# Patient Record
Sex: Male | Born: 1970 | Race: White | Hispanic: No | Marital: Single | Smoking: Former smoker
Health system: Southern US, Community
[De-identification: ages and names within clinical notes are randomized; demographics above are authoritative.]

## PROBLEM LIST (undated history)

## (undated) DIAGNOSIS — F1010 Alcohol abuse, uncomplicated: Secondary | ICD-10-CM

## (undated) DIAGNOSIS — F10280 Alcohol dependence with alcohol-induced anxiety disorder: Secondary | ICD-10-CM

## (undated) DIAGNOSIS — K219 Gastro-esophageal reflux disease without esophagitis: Secondary | ICD-10-CM

## (undated) DIAGNOSIS — I10 Essential (primary) hypertension: Secondary | ICD-10-CM

## (undated) HISTORY — PX: ELBOW SURGERY: SHX618

---

## 2000-09-14 ENCOUNTER — Encounter: Admission: RE | Admit: 2000-09-14 | Discharge: 2000-09-14 | Payer: Self-pay | Admitting: Orthopedic Surgery

## 2000-09-14 ENCOUNTER — Encounter: Payer: Self-pay | Admitting: Orthopedic Surgery

## 2020-10-29 ENCOUNTER — Other Ambulatory Visit: Payer: Self-pay

## 2020-10-29 ENCOUNTER — Emergency Department
Admission: EM | Admit: 2020-10-29 | Discharge: 2020-10-29 | Disposition: A | Discharge status: Home / Self Care | Payer: Managed Care, Other (non HMO) | Attending: Family Medicine | Admitting: Family Medicine

## 2020-10-29 ENCOUNTER — Emergency Department: Payer: Managed Care, Other (non HMO)

## 2020-10-29 DIAGNOSIS — Z5321 Procedure and treatment not carried out due to patient leaving prior to being seen by health care provider: Secondary | ICD-10-CM | POA: Insufficient documentation

## 2020-10-29 DIAGNOSIS — M7989 Other specified soft tissue disorders: Secondary | ICD-10-CM | POA: Diagnosis not present

## 2020-10-29 DIAGNOSIS — M79671 Pain in right foot: Secondary | ICD-10-CM | POA: Insufficient documentation

## 2020-10-29 DIAGNOSIS — R251 Tremor, unspecified: Secondary | ICD-10-CM | POA: Insufficient documentation

## 2020-10-29 LAB — CBC WITH DIFFERENTIAL/PLATELET
Abs Immature Granulocytes: 0.01 10*3/uL (ref 0.00–0.07)
Basophils Absolute: 0.1 10*3/uL (ref 0.0–0.1)
Basophils Relative: 2 %
Eosinophils Absolute: 0.1 10*3/uL (ref 0.0–0.5)
Eosinophils Relative: 1 %
HCT: 42.6 % (ref 36.0–46.0)
Hemoglobin: 15.5 g/dL — ABNORMAL HIGH (ref 12.0–15.0)
Immature Granulocytes: 0 %
Lymphocytes Relative: 26 %
Lymphs Abs: 1.4 10*3/uL (ref 0.7–4.0)
MCH: 36.6 pg — ABNORMAL HIGH (ref 26.0–34.0)
MCHC: 36.4 g/dL — ABNORMAL HIGH (ref 30.0–36.0)
MCV: 100.7 fL — ABNORMAL HIGH (ref 80.0–100.0)
Monocytes Absolute: 0.8 10*3/uL (ref 0.1–1.0)
Monocytes Relative: 14 %
Neutro Abs: 3.1 10*3/uL (ref 1.7–7.7)
Neutrophils Relative %: 57 %
Platelets: 119 10*3/uL — ABNORMAL LOW (ref 150–400)
RBC: 4.23 MIL/uL (ref 3.87–5.11)
RDW: 14.3 % (ref 11.5–15.5)
Smear Review: NORMAL
WBC: 5.5 10*3/uL (ref 4.0–10.5)
nRBC: 0 % (ref 0.0–0.2)

## 2020-10-29 LAB — BASIC METABOLIC PANEL
Anion gap: 13 (ref 5–15)
BUN: 8 mg/dL (ref 6–20)
CO2: 20 mmol/L — ABNORMAL LOW (ref 22–32)
Calcium: 8.9 mg/dL (ref 8.9–10.3)
Chloride: 101 mmol/L (ref 98–111)
Creatinine, Ser: 0.6 mg/dL (ref 0.44–1.00)
GFR, Estimated: >60 mL/min (ref >60)
Glucose, Bld: 124 mg/dL — ABNORMAL HIGH (ref 70–99)
Potassium: 3.9 mmol/L (ref 3.5–5.1)
Sodium: 134 mmol/L — ABNORMAL LOW (ref 135–145)

## 2020-10-29 NOTE — ED Triage Notes (Signed)
Pt states that he woke up and noticed his right foot was discolored, states that he isn't having pain, the right foot and lower leg are more red than the left and swollen compared to the left, pt is a little shaky and states that he is nervous and that he does drink alcohol, his last drink was last pm and he wouldn't typically be drinking at this time of day  Pedal pulse palpated

## 2020-10-29 NOTE — ED Provider Notes (Signed)
Emergency Medicine Provider Triage Evaluation Note  Lawrence Rowe , a 50 y.o. male  was evaluated in triage.  Pt complains of right foot pain and discoloration. No pain. No injury. No previous symptoms of the same.  Review of Systems  Positive: Right foot swelling. Negative: Pain, fever  Physical Exam  There were no vitals taken for this visit. Gen:   Awake, no distress   Resp:  Normal effort  MSK:   Moves extremities without difficulty  Other:    Medical Decision Making  Medically screening exam initiated at 12:14 PM.  Appropriate orders placed.  [name] was informed that the remainder of the evaluation will be completed by another provider, this initial triage assessment does not replace that evaluation, and the importance of remaining in the ED until their evaluation is complete.   Chinita Pester, FNP 10/29/20 1224    Jene Every, MD 10/29/20 1249

## 2021-04-06 ENCOUNTER — Ambulatory Visit: Admission: EM | Admit: 2021-04-06 | Discharge: 2021-04-06 | Payer: Managed Care, Other (non HMO)

## 2021-04-06 ENCOUNTER — Encounter: Payer: Self-pay | Admitting: Emergency Medicine

## 2021-04-06 ENCOUNTER — Emergency Department
Admission: EM | Admit: 2021-04-06 | Discharge: 2021-04-06 | Disposition: A | Discharge status: Home / Self Care | Payer: Self-pay | Attending: Emergency Medicine | Admitting: Emergency Medicine

## 2021-04-06 ENCOUNTER — Emergency Department: Payer: Self-pay

## 2021-04-06 ENCOUNTER — Encounter: Payer: Self-pay | Admitting: Intensive Care

## 2021-04-06 ENCOUNTER — Other Ambulatory Visit: Payer: Self-pay

## 2021-04-06 DIAGNOSIS — F10129 Alcohol abuse with intoxication, unspecified: Secondary | ICD-10-CM | POA: Insufficient documentation

## 2021-04-06 DIAGNOSIS — F1010 Alcohol abuse, uncomplicated: Secondary | ICD-10-CM

## 2021-04-06 DIAGNOSIS — Y900 Blood alcohol level of less than 20 mg/100 ml: Secondary | ICD-10-CM | POA: Insufficient documentation

## 2021-04-06 DIAGNOSIS — R000 Tachycardia, unspecified: Secondary | ICD-10-CM | POA: Insufficient documentation

## 2021-04-06 DIAGNOSIS — M7989 Other specified soft tissue disorders: Secondary | ICD-10-CM

## 2021-04-06 DIAGNOSIS — M79604 Pain in right leg: Secondary | ICD-10-CM

## 2021-04-06 DIAGNOSIS — E871 Hypo-osmolality and hyponatremia: Secondary | ICD-10-CM | POA: Insufficient documentation

## 2021-04-06 DIAGNOSIS — L03115 Cellulitis of right lower limb: Secondary | ICD-10-CM | POA: Insufficient documentation

## 2021-04-06 HISTORY — DX: Alcohol abuse, uncomplicated: F10.10

## 2021-04-06 LAB — CBC
HCT: 45.5 % (ref 39.0–52.0)
Hemoglobin: 15.8 g/dL (ref 13.0–17.0)
MCH: 34.4 pg — ABNORMAL HIGH (ref 26.0–34.0)
MCHC: 34.7 g/dL (ref 30.0–36.0)
MCV: 99.1 fL (ref 80.0–100.0)
Platelets: 103 10*3/uL — ABNORMAL LOW (ref 150–400)
RBC: 4.59 MIL/uL (ref 4.22–5.81)
RDW: 14.1 % (ref 11.5–15.5)
WBC: 6.1 10*3/uL (ref 4.0–10.5)
nRBC: 0 % (ref 0.0–0.2)

## 2021-04-06 LAB — COMPREHENSIVE METABOLIC PANEL
ALT: 75 U/L — ABNORMAL HIGH (ref 0–44)
AST: 123 U/L — ABNORMAL HIGH (ref 15–41)
Albumin: 4.6 g/dL (ref 3.5–5.0)
Alkaline Phosphatase: 76 U/L (ref 38–126)
Anion gap: 12 (ref 5–15)
BUN: 16 mg/dL (ref 6–20)
CO2: 23 mmol/L (ref 22–32)
Calcium: 9.4 mg/dL (ref 8.9–10.3)
Chloride: 97 mmol/L — ABNORMAL LOW (ref 98–111)
Creatinine, Ser: 0.72 mg/dL (ref 0.61–1.24)
GFR, Estimated: >60 mL/min (ref >60)
Glucose, Bld: 153 mg/dL — ABNORMAL HIGH (ref 70–99)
Potassium: 4 mmol/L (ref 3.5–5.1)
Sodium: 132 mmol/L — ABNORMAL LOW (ref 135–145)
Total Bilirubin: 3.1 mg/dL — ABNORMAL HIGH (ref 0.3–1.2)
Total Protein: 10 g/dL — ABNORMAL HIGH (ref 6.5–8.1)

## 2021-04-06 LAB — ETHANOL: Alcohol, Ethyl (B): <10 mg/dL (ref <10)

## 2021-04-06 MED ORDER — CHLORDIAZEPOXIDE HCL 25 MG PO CAPS
25.0000 mg | ORAL_CAPSULE | Freq: Once | ORAL | Status: AC
  Administered 2021-04-06: 25 mg via ORAL
  Filled 2021-04-06: qty 1

## 2021-04-06 MED ORDER — CEPHALEXIN 500 MG PO CAPS: 500.0000 mg | ORAL_CAPSULE | Freq: Three times a day (TID) | ORAL | 0 refills | Status: AC

## 2021-04-06 MED ORDER — CHLORDIAZEPOXIDE HCL 25 MG PO CAPS: ORAL_CAPSULE | ORAL | 0 refills | Status: AC

## 2021-04-06 MED ORDER — CEPHALEXIN 500 MG PO CAPS
500.0000 mg | ORAL_CAPSULE | Freq: Once | ORAL | Status: AC
  Administered 2021-04-06: 500 mg via ORAL
  Filled 2021-04-06: qty 1

## 2021-04-06 MED ORDER — LORAZEPAM 2 MG/ML IJ SOLN
1.0000 mg | Freq: Once | INTRAMUSCULAR | Status: AC
  Administered 2021-04-06: 1 mg via INTRAVENOUS
  Filled 2021-04-06: qty 1

## 2021-04-06 NOTE — ED Notes (Signed)
Pt sent from [facility] for elevated BP. Pt went to UC for b/l foot swelling & discoloration, noticed this AM. Pt states he has never had this before. Pt denies pain. ?

## 2021-04-06 NOTE — Discharge Instructions (Signed)
You are being discharged with a 1 week course of Keflex antibiotic to take 3 times daily to treat the skin infection to her right lower leg.  Please finish all 21 pills. ? ?You are being discharged with a chlordiazepoxide/Librium taper over the next few days to help with alcohol withdrawals. ?On the first day, take 1-2 tablets every 6 hours as needed ?Day 2, 1 tablet every 6 hours as needed ?Day 3, 1 tablet every 12 hours as needed ?Day 4, 1 tablet every 12-24 hours as needed ? ?Do not drink alcohol while taking this medication. ?

## 2021-04-06 NOTE — ED Triage Notes (Signed)
Pt presents with bilateral foot/toe pain and is having difficulties walking due to his pain and anxiety. He reports having a full work up in the ED in October 2022. Pt left the ED without being seen and did not follow up. During check in patients BP is 205/114 and reports not having hypertension to his knowledge. The provider Wendee Beavers was notified and she spoke with patient regarding his BP and symptoms. He decided to go to the ED to be seen he was taken by his friend that accompanied him.  ?

## 2021-04-06 NOTE — ED Provider Notes (Signed)
? ?[facility] ?Provider Note ? ? ? Event Date/Time  ? First MD Initiated Contact with Patient 04/06/21 1458   ?  (approximate) ? ? ?History  ? ?Alcohol Intoxication and Foot Swelling ? ? ?HPI ? ?Lawrence Rowe is a 51 y.o. male who presents to the ED for evaluation of Alcohol Intoxication and Foot Swelling ?  ?No hx in the chart.  ? ?Patient presents to the ED with his ex-girlfriend for evaluation of alcoholism, right foot and ankle swelling .  He reports atraumatic swelling, erythema and pain to his right distal leg over the past couple days. ? ?Reports he typically drinks a 12 pack of beer per day, but has not drank in the past couple days due to his leg not feeling well and him trying to get healthier.  Denies any suicidal intent or other recreational drugs. ? ? ?Physical Exam  ? ?Triage Vital Signs: ?ED Triage Vitals [04/06/21 1340]  ?Enc Vitals Group  ?   BP (!) 169/122  ?   Pulse Rate (!) 121  ?   Resp 18  ?   Temp 98.8 ?F (37.1 ?C)  ?   Temp Source Oral  ?   SpO2 96 %  ?   Weight 200 lb (90.7 kg)  ?   Height 6' (1.829 m)  ?   Head Circumference   ?   Peak Flow   ?   Pain Score 0  ?   Pain Loc   ?   Pain Edu?   ?   Excl. in GC?   ? ? ?Most recent vital signs: ?Vitals:  ? 04/06/21 1730 04/06/21 1815  ?BP: (!) 147/101 (!) 156/97  ?Pulse: 91   ?Resp:    ?Temp:    ?SpO2: 100%   ? ? ?General: Awake, no distress.  Somewhat tremulous initially prior to benzodiazepines.  Otherwise pleasant and conversational in full sentences. ?CV:  Good peripheral perfusion.  Tachycardic and regular, resolving with Ativan and chlordiazepoxide. ?Resp:  Normal effort.  ?Abd:  No distention.  ?MSK:  Skin over the right ankle with erythema, induration and mild soft tissue swelling that is asymmetric to the left.  Passive range of motion of the right ankle without pain.  Right foot is distally neurovascularly intact. ?Neuro:  No focal deficits appreciated. ?Other:   ? ? ?ED Results / Procedures / Treatments   ? ?Labs ?(all labs ordered are listed, but only abnormal results are displayed) ?Labs Reviewed  ?COMPREHENSIVE METABOLIC PANEL - Abnormal; Notable for the following components:  ?    Result Value  ? Sodium 132 (*)   ? Chloride 97 (*)   ? Glucose, Bld 153 (*)   ? Total Protein 10.0 (*)   ? AST 123 (*)   ? ALT 75 (*)   ? Total Bilirubin 3.1 (*)   ? All other components within normal limits  ?CBC - Abnormal; Notable for the following components:  ? MCH 34.4 (*)   ? Platelets 103 (*)   ? All other components within normal limits  ?ETHANOL  ?URINE DRUG SCREEN, QUALITATIVE ([facility] ONLY)  ? ? ?EKG ?Sinus tachycardia with a rate of 116 bpm.  Normal axis and intervals.  Tremulous baseline.  No evidence of acute ischemia.  No comparison. ? ?RADIOLOGY ?Venous ultrasound without evidence of DVT ? ?Official radiology report(s): ?US Venous Img Lower Unilateral Right (DVT) ? ?Result Date: 04/06/2021 ?CLINICAL DATA:  Right lower extremity pain and swelling. EXAM: RIGHT LOWER  EXTREMITY VENOUS DOPPLER ULTRASOUND TECHNIQUE: Gray-scale sonography with compression, as well as color and duplex ultrasound, were performed to evaluate the deep venous system(s) from the level of the common femoral vein through the popliteal and proximal calf veins. COMPARISON:  None. FINDINGS: VENOUS Normal compressibility of the common femoral, superficial femoral, and popliteal veins, as well as the visualized calf veins. Visualized portions of profunda femoral vein and great saphenous vein unremarkable. No filling defects to suggest DVT on grayscale or color Doppler imaging. Doppler waveforms show normal direction of venous flow, normal respiratory plasticity and response to augmentation. Limited views of the contralateral common femoral vein are unremarkable. OTHER None. Limitations: none IMPRESSION: Negative. Electronically Signed   By: Larose Hires D.O.   On: 04/06/2021 17:25   ? ?PROCEDURES and INTERVENTIONS: ? ?.1-3 Lead EKG Interpretation ?Performed  by: Delton Prairie, MD ?Authorized by: Delton Prairie, MD  ? ?  Interpretation: abnormal   ?  ECG rate:  112 ?  ECG rate assessment: tachycardic   ?  Rhythm: sinus tachycardia   ?  Ectopy: none   ?  Conduction: normal   ? ?Medications  ?LORazepam (ATIVAN) injection 1 mg (1 mg Intravenous Given 04/06/21 1601)  ?chlordiazePOXIDE (LIBRIUM) capsule 25 mg (25 mg Oral Given 04/06/21 1600)  ?cephALEXin (KEFLEX) capsule 500 mg (500 mg Oral Given 04/06/21 1600)  ? ? ? ?IMPRESSION / MDM / ASSESSMENT AND PLAN / ED COURSE  ?I reviewed the triage vital signs and the nursing notes. ? ?51 year old male presents to the ED with evidence of alcohol withdrawals and right leg cellulitis, ultimately suitable for outpatient management.  He presents with evidence of mild withdrawals of ethanol, with tremulousness and tachycardia, resolved with benzodiazepines.  Start the patient on a chlordiazepoxide taper.  No evidence of DTs or seizures.  Blood work without evidence of alcoholic ketoacidosis.  Minimal hyponatremia and elevated LFTs.  CBC is normal.  DVT ultrasound without evidence of DVT.  Started the patient on Keflex to treat cellulitis of the right leg and discharged with Librium taper.  Provided referral to [facility] and discussed return precautions for the ED. ? ?  ? ? ?FINAL CLINICAL IMPRESSION(S) / ED DIAGNOSES  ? ?Final diagnoses:  ?ETOH abuse  ?Cellulitis of right lower extremity  ? ? ? ?Rx / DC Orders  ? ?ED Discharge Orders   ? ?      Ordered  ?  chlordiazePOXIDE (LIBRIUM) 25 MG capsule       ? 04/06/21 1622  ?  cephALEXin (KEFLEX) 500 MG capsule  3 times daily       ? 04/06/21 1622  ? ?  ?  ? ?  ? ? ? ?Note:  This document was prepared using Dragon voice recognition software and may include unintentional dictation errors. ?  ?Delton Prairie, MD ?04/06/21 2143 ? ?

## 2021-04-06 NOTE — ED Triage Notes (Signed)
Patient sent from [facility] for hypertension. Patient reports he went to be seen at [facility] for his bilateral foot swelling and growth. During triage patient is tachy, noticeable tremors and reports his last alcoholic drink was Sunday.  ?

## 2021-04-06 NOTE — ED Notes (Addendum)
Dr. Smith at bedside.

## 2021-04-14 ENCOUNTER — Ambulatory Visit
Admission: EM | Admit: 2021-04-14 | Discharge: 2021-04-14 | Disposition: A | Discharge status: Home / Self Care | Payer: No Typology Code available for payment source

## 2021-04-14 DIAGNOSIS — R000 Tachycardia, unspecified: Secondary | ICD-10-CM

## 2021-04-14 DIAGNOSIS — F1011 Alcohol abuse, in remission: Secondary | ICD-10-CM

## 2021-04-14 DIAGNOSIS — R030 Elevated blood-pressure reading, without diagnosis of hypertension: Secondary | ICD-10-CM

## 2021-04-14 NOTE — Discharge Instructions (Addendum)
Get compression stockings from the pharmacy and get number 12 compression, and wear it all the time.  ?Monitor your blood pressure and keep a record and needs to go down to less than 140/90 ?Continue to abstain from drinking. Avoid foods that are high in sodium ? ?Follow up with Dr Lavera Guise at 11:30 am on Tuesday 4/18 ? ?If you get chest pains, shortness of breath, you MUST GO TO ER ?

## 2021-04-14 NOTE — ED Triage Notes (Signed)
Patient presents to Urgent Care for follow-up from [facility] ED visit from 03/29. Pt was treated for cellulitis and alcohol withdrawal. Last drink per report 1.5 weeks ago. He states he finished the medications prescribed, concerned with continued swelling, mild redness, and warmth to bilateral feet. He is also concerned with elevated blood pressure.  ? ?Denies fever, SOB, chest pain, headache, or pain.  ?

## 2021-04-19 ENCOUNTER — Encounter: Payer: Self-pay | Admitting: Internal Medicine

## 2021-04-19 ENCOUNTER — Ambulatory Visit (INDEPENDENT_AMBULATORY_CARE_PROVIDER_SITE_OTHER): Payer: No Typology Code available for payment source | Admitting: Internal Medicine

## 2021-04-19 VITALS — BP 187/111 | HR 111 | Ht 72.0 in | Wt 210.5 lb

## 2021-04-19 DIAGNOSIS — I739 Peripheral vascular disease, unspecified: Secondary | ICD-10-CM

## 2021-04-19 DIAGNOSIS — I10 Essential (primary) hypertension: Secondary | ICD-10-CM

## 2021-04-19 DIAGNOSIS — F1010 Alcohol abuse, uncomplicated: Secondary | ICD-10-CM | POA: Diagnosis not present

## 2021-04-19 DIAGNOSIS — F1021 Alcohol dependence, in remission: Secondary | ICD-10-CM | POA: Insufficient documentation

## 2021-04-19 DIAGNOSIS — E8881 Metabolic syndrome: Secondary | ICD-10-CM | POA: Insufficient documentation

## 2021-04-19 DIAGNOSIS — F1091 Alcohol use, unspecified, in remission: Secondary | ICD-10-CM | POA: Insufficient documentation

## 2021-04-19 DIAGNOSIS — R000 Tachycardia, unspecified: Secondary | ICD-10-CM

## 2021-04-19 MED ORDER — METOPROLOL SUCCINATE ER 50 MG PO TB24: 50.0000 mg | ORAL_TABLET | Freq: Every day | ORAL | 3 refills | Status: DC

## 2021-04-19 NOTE — Assessment & Plan Note (Signed)
We will start the patient on beta-blocker ?

## 2021-04-19 NOTE — Assessment & Plan Note (Signed)
Patient was advised to lose weight 

## 2021-04-19 NOTE — Assessment & Plan Note (Signed)
Right foot is swollen and hand dermatitis on the right foot , pulses are not palpable to both feet due to swelling right foot is colder than the left.  Right leg is swollen.  Ultrasound done in the hospital does not show any blood clot.,  We will send the patient to the vascular specialist for evaluation ? patient to stop drinking ?

## 2021-04-19 NOTE — Progress Notes (Signed)
? ?New Patient Office Visit ? ?Subjective:  ?Patient ID: MILAS SCHAPPELL, male    DOB: 1970/05/22  Age: 51 y.o. MRN: [id number] ? ?CC:  ?Chief Complaint  ?Patient presents with  ? New Patient (Initial Visit)  ?  Patient here today to establish care. Patient had a recent ed visit with elevated BP.   ? ? ?Hypertension ?This is a new problem. The current episode started more than 1 year ago. The problem has been waxing and waning since onset. The problem is uncontrolled. Associated symptoms include anxiety, neck pain, palpitations and peripheral edema. Pertinent negatives include no blurred vision, chest pain, headaches, malaise/fatigue, orthopnea or shortness of breath. Hypertensive end-organ damage includes PVD. There is no history of CVA or heart failure.  ?Patient presents for new pt visit ?Past Medical History:  ?Diagnosis Date  ? Alcohol abuse   ? ? ? ?Current Outpatient Medications:  ?  ALPRAZolam (XANAX) 1 MG tablet, Take 1 mg by mouth at bedtime as needed for anxiety. Take 1 and half tablets daily prn, Disp: , Rfl:   ? ?Past Surgical History:  ?Procedure Laterality Date  ? ELBOW SURGERY Left   ? ? ?History reviewed. No pertinent family history. ? ?Social History  ? ?Socioeconomic History  ? Marital status: Single  ?  Spouse name: Not on file  ? Number of children: Not on file  ? Years of education: Not on file  ? Highest education level: Not on file  ?Occupational History  ? Not on file  ?Tobacco Use  ? Smoking status: Former  ?  Types: Cigarettes  ?  Quit date: 2020  ?  Years since quitting: 3.2  ? Smokeless tobacco: Never  ?Vaping Use  ? Vaping Use: Never used  ?Substance and Sexual Activity  ? Alcohol use: Yes  ?  Alcohol/week: 12.0 standard drinks  ?  Types: 12 Cans of beer per week  ?  Comment: occassional wine  ? Drug use: Never  ? Sexual activity: Not on file  ?Other Topics Concern  ? Not on file  ?Social History Narrative  ? Not on file  ? ?Social Determinants of Health  ? ?Financial Resource  Strain: Not on file  ?Food Insecurity: Not on file  ?Transportation Needs: Not on file  ?Physical Activity: Not on file  ?Stress: Not on file  ?Social Connections: Not on file  ?Intimate Partner Violence: Not on file  ? ? ?ROS ?Review of Systems  ?Constitutional:  Negative for malaise/fatigue.  ?Eyes:  Negative for blurred vision.  ?Respiratory:  Negative for shortness of breath.   ?Cardiovascular:  Positive for palpitations. Negative for chest pain and orthopnea.  ?Musculoskeletal:  Positive for neck pain.  ?Neurological:  Negative for headaches.  ? ?Objective:  ? ?Today's Vitals: BP (!) 187/111   Pulse (!) 111   Ht 6' (1.829 m)   Wt 210 lb 8 oz (95.5 kg)   BMI 28.55 kg/m?  ? ?Physical Exam ?Constitutional:   ?   Appearance: Normal appearance.  ?HENT:  ?   Head: Normocephalic.  ?   Mouth/Throat:  ?   Mouth: Mucous membranes are moist.  ?Cardiovascular:  ?   Rate and Rhythm: Tachycardia present.  ?   Heart sounds:  ?  No gallop.  ?Pulmonary:  ?   Breath sounds: No wheezing or rhonchi.  ?Abdominal:  ?   Palpations: Abdomen is soft.  ?   Tenderness: There is no abdominal tenderness.  ?Skin: ?   General: Skin is  warm.  ?Neurological:  ?   General: No focal deficit present.  ?   Mental Status: He is alert.  ? ? ?Assessment & Plan:  ? ?Problem List Items Addressed This Visit   ? ?  ? Cardiovascular and Mediastinum  ? PAD (peripheral artery disease) (HCC)  ?  Right foot is swollen and hand dermatitis on the right foot , pulses are not palpable to both feet due to swelling right foot is colder than the left.  Right leg is swollen.  Ultrasound done in the hospital does not show any blood clot.,  We will send the patient to the vascular specialist for evaluation ? patient to stop drinking ?  ?  ?  ? Other  ? Tachycardia with hypertension - Primary  ?  We will start the patient on beta-blocker ?  ?  ? Relevant Medications  ? ALPRAZolam (XANAX) 1 MG tablet  ? Alcohol abuse  ?  Patient has stopped drinking ?  ?  ? Metabolic  syndrome  ?  Patient was advised to lose weight. ?  ?  ? ? ?Outpatient Encounter Medications as of 04/19/2021  ?Medication Sig  ? ALPRAZolam (XANAX) 1 MG tablet Take 1 mg by mouth at bedtime as needed for anxiety. Take 1 and half tablets daily prn  ? ?No facility-administered encounter medications on file as of 04/19/2021.  ? ? ?Follow-up: No follow-ups on file.  ? ?Corky Downs, MD ?

## 2021-04-19 NOTE — Addendum Note (Signed)
Addended by: Jobie Quaker on: 04/19/2021 12:34 PM ? ? Modules accepted: Orders ? ?

## 2021-04-19 NOTE — Assessment & Plan Note (Signed)
Patient has stopped drinking 

## 2021-05-06 ENCOUNTER — Ambulatory Visit (INDEPENDENT_AMBULATORY_CARE_PROVIDER_SITE_OTHER): Payer: No Typology Code available for payment source

## 2021-05-06 DIAGNOSIS — I10 Essential (primary) hypertension: Secondary | ICD-10-CM | POA: Diagnosis not present

## 2021-05-06 DIAGNOSIS — R000 Tachycardia, unspecified: Secondary | ICD-10-CM

## 2021-05-06 DIAGNOSIS — Z7689 Persons encountering health services in other specified circumstances: Secondary | ICD-10-CM

## 2021-05-09 LAB — COMPLETE METABOLIC PANEL WITH GFR
AG Ratio: 1.1 (calc) (ref 1.0–2.5)
ALT: 43 U/L (ref 9–46)
AST: 51 U/L — ABNORMAL HIGH (ref 10–35)
Albumin: 4 g/dL (ref 3.6–5.1)
Alkaline phosphatase (APISO): 64 U/L (ref 35–144)
BUN: 11 mg/dL (ref 7–25)
CO2: 20 mmol/L (ref 20–32)
Calcium: 9.5 mg/dL (ref 8.6–10.3)
Chloride: 103 mmol/L (ref 98–110)
Creat: 0.73 mg/dL (ref 0.70–1.30)
Globulin: 3.7 g/dL (calc) (ref 1.9–3.7)
Glucose, Bld: 109 mg/dL — ABNORMAL HIGH (ref 65–99)
Potassium: 4.6 mmol/L (ref 3.5–5.3)
Sodium: 139 mmol/L (ref 135–146)
Total Bilirubin: 1.3 mg/dL — ABNORMAL HIGH (ref 0.2–1.2)
Total Protein: 7.7 g/dL (ref 6.1–8.1)
eGFR: 109 mL/min/{1.73_m2} (ref >=60)

## 2021-05-09 LAB — HIV ANTIBODY (ROUTINE TESTING W REFLEX): HIV 1&2 Ab, 4th Generation: NONREACTIVE

## 2021-05-09 LAB — CBC WITH DIFFERENTIAL/PLATELET
Absolute Monocytes: 1101 cells/uL — ABNORMAL HIGH (ref 200–950)
Basophils Absolute: 52 cells/uL (ref 0–200)
Basophils Relative: 0.6 %
Eosinophils Absolute: 129 cells/uL (ref 15–500)
Eosinophils Relative: 1.5 %
HCT: 46.3 % (ref 38.5–50.0)
Hemoglobin: 15.7 g/dL (ref 13.2–17.1)
Lymphs Abs: 2193 cells/uL (ref 850–3900)
MCH: 33.7 pg — ABNORMAL HIGH (ref 27.0–33.0)
MCHC: 33.9 g/dL (ref 32.0–36.0)
MCV: 99.4 fL (ref 80.0–100.0)
MPV: 9.4 fL (ref 7.5–12.5)
Monocytes Relative: 12.8 %
Neutro Abs: 5126 cells/uL (ref 1500–7800)
Neutrophils Relative %: 59.6 %
Platelets: 225 10*3/uL (ref 140–400)
RBC: 4.66 10*6/uL (ref 4.20–5.80)
RDW: 12.6 % (ref 11.0–15.0)
Total Lymphocyte: 25.5 %
WBC: 8.6 10*3/uL (ref 3.8–10.8)

## 2021-05-09 LAB — HEPATITIS C ANTIBODY
Hepatitis C Ab: NONREACTIVE
SIGNAL TO CUT-OFF: 0.1 (ref <1.00)

## 2021-05-09 LAB — TSH: TSH: 2.83 mIU/L (ref 0.40–4.50)

## 2021-05-09 LAB — LIPID PANEL
Cholesterol: 175 mg/dL (ref <200)
HDL: 41 mg/dL (ref >=40)
LDL Cholesterol (Calc): 115 mg/dL (calc) — ABNORMAL HIGH
Non-HDL Cholesterol (Calc): 134 mg/dL (calc) — ABNORMAL HIGH (ref <130)
Total CHOL/HDL Ratio: 4.3 (calc) (ref <5.0)
Triglycerides: 93 mg/dL (ref <150)

## 2021-05-09 LAB — PSA: PSA: 0.67 ng/mL (ref <=4.00)

## 2021-05-10 ENCOUNTER — Encounter: Payer: Self-pay | Admitting: Internal Medicine

## 2021-05-10 ENCOUNTER — Ambulatory Visit (INDEPENDENT_AMBULATORY_CARE_PROVIDER_SITE_OTHER): Payer: No Typology Code available for payment source | Admitting: Internal Medicine

## 2021-05-10 VITALS — BP 140/80 | HR 84 | Ht 72.0 in | Wt 208.2 lb

## 2021-05-10 DIAGNOSIS — F1010 Alcohol abuse, uncomplicated: Secondary | ICD-10-CM

## 2021-05-10 DIAGNOSIS — I10 Essential (primary) hypertension: Secondary | ICD-10-CM

## 2021-05-10 DIAGNOSIS — L309 Dermatitis, unspecified: Secondary | ICD-10-CM

## 2021-05-10 DIAGNOSIS — E8881 Metabolic syndrome: Secondary | ICD-10-CM

## 2021-05-10 DIAGNOSIS — R000 Tachycardia, unspecified: Secondary | ICD-10-CM

## 2021-05-10 NOTE — Assessment & Plan Note (Signed)

## 2021-05-10 NOTE — Assessment & Plan Note (Signed)

## 2021-05-10 NOTE — Assessment & Plan Note (Signed)
Patient says that he has stopped drinking ?

## 2021-05-10 NOTE — Assessment & Plan Note (Signed)
Patient will be referred to the dermatologist ?

## 2021-05-10 NOTE — Assessment & Plan Note (Signed)
Under control on beta-blocker ?

## 2021-05-10 NOTE — Progress Notes (Signed)
? ?Established Patient Office Visit ? ?Subjective:  ?Patient ID: Lawrence Rowe, male    DOB: [birth year]  Age: 51 y.o. MRN: [id number] ? ?CC:  ?Chief Complaint  ?Patient presents with  ? Hypertension  ? ? ?Hypertension ? ? ?[name] presents for check up ? ?Past Medical History:  ?Diagnosis Date  ? Alcohol abuse   ? ? ?Past Surgical History:  ?Procedure Laterality Date  ? ELBOW SURGERY Left   ? ? ?History reviewed. No pertinent family history. ? ?Social History  ? ?Socioeconomic History  ? Marital status: Single  ?  Spouse name: Not on file  ? Number of children: Not on file  ? Years of education: Not on file  ? Highest education level: Not on file  ?Occupational History  ? Not on file  ?Tobacco Use  ? Smoking status: Former  ?  Types: Cigarettes  ?  Quit date: 2020  ?  Years since quitting: 3.3  ? Smokeless tobacco: Never  ?Vaping Use  ? Vaping Use: Never used  ?Substance and Sexual Activity  ? Alcohol use: Yes  ?  Alcohol/week: 12.0 standard drinks  ?  Types: 12 Cans of beer per week  ?  Comment: occassional wine  ? Drug use: Never  ? Sexual activity: Not on file  ?Other Topics Concern  ? Not on file  ?Social History Narrative  ? Not on file  ? ?Social Determinants of Health  ? ?Financial Resource Strain: Not on file  ?Food Insecurity: Not on file  ?Transportation Needs: Not on file  ?Physical Activity: Not on file  ?Stress: Not on file  ?Social Connections: Not on file  ?Intimate Partner Violence: Not on file  ? ? ? ?Current Outpatient Medications:  ?  ALPRAZolam (XANAX) 1 MG tablet, Take 1 mg by mouth at bedtime as needed for anxiety. Take 1 and half tablets daily prn, Disp: , Rfl:  ?  metoprolol succinate (TOPROL-XL) 50 MG 24 hr tablet, Take 1 tablet (50 mg total) by mouth daily. Take with or immediately following a meal., Disp: 90 tablet, Rfl: 3  ? ?Allergies  ?Allergen Reactions  ? Penicillins Other (See Comments)  ? ? ?ROS ?Review of Systems  ?Constitutional: Negative.   ?HENT: Negative.     ?Eyes: Negative.   ?Respiratory: Negative.    ?Cardiovascular: Negative.   ?Gastrointestinal: Negative.   ?Endocrine: Negative.   ?Genitourinary: Negative.   ?Musculoskeletal: Negative.   ?Skin: Negative.   ?Allergic/Immunologic: Negative.   ?Neurological: Negative.   ?Hematological: Negative.   ?Psychiatric/Behavioral: Negative.    ?All other systems reviewed and are negative. ? ?  ?Objective:  ?  ?Physical Exam ?Vitals reviewed.  ?Constitutional:   ?   Appearance: Normal appearance.  ?HENT:  ?   Mouth/Throat:  ?   Mouth: Mucous membranes are moist.  ?Eyes:  ?   Pupils: Pupils are equal, round, and reactive to light.  ?Neck:  ?   Vascular: No carotid bruit.  ?Cardiovascular:  ?   Rate and Rhythm: Normal rate and regular rhythm.  ?   Pulses: Normal pulses.  ?   Heart sounds: Normal heart sounds.  ?Pulmonary:  ?   Effort: Pulmonary effort is normal.  ?   Breath sounds: Normal breath sounds.  ?Abdominal:  ?   General: Bowel sounds are normal.  ?   Palpations: Abdomen is soft. There is no hepatomegaly, splenomegaly or mass.  ?   Tenderness: There is no abdominal tenderness.  ?   Hernia:  No hernia is present.  ?Musculoskeletal:  ?   Cervical back: Neck supple.  ?   Right lower leg: No edema.  ?   Left lower leg: No edema.  ?Skin: ?   Findings: No rash.  ?   Comments: Dermatitis  of both legs  ?Neurological:  ?   Mental Status: He is alert and oriented to person, place, and time.  ?   Motor: No weakness.  ?Psychiatric:     ?   Mood and Affect: Mood normal.     ?   Behavior: Behavior normal.  ? ? ?BP 140/80   Pulse 84   Ht 6' (1.829 m)   Wt 208 lb 3.2 oz (94.4 kg)   BMI 28.24 kg/m?  ?Wt Readings from Last 3 Encounters:  ?05/10/21 208 lb 3.2 oz (94.4 kg)  ?04/19/21 210 lb 8 oz (95.5 kg)  ?04/06/21 200 lb (90.7 kg)  ? ? ? ?Health Maintenance Due  ?Topic Date Due  ? COVID-19 Vaccine (1) Never done  ? TETANUS/TDAP  Never done  ? COLONOSCOPY (Pts 45-75yr Insurance coverage will need to be confirmed)  Never done  ? Zoster  Vaccines- Shingrix (1 of 2) Never done  ? ? ?There are no preventive care reminders to display for this patient. ? ?Lab Results  ?Component Value Date  ? TSH 2.83 05/06/2021  ? ?Lab Results  ?Component Value Date  ? WBC 8.6 05/06/2021  ? HGB 15.7 05/06/2021  ? HCT 46.3 05/06/2021  ? MCV 99.4 05/06/2021  ? PLT 225 05/06/2021  ? ?Lab Results  ?Component Value Date  ? NA 139 05/06/2021  ? K 4.6 05/06/2021  ? CO2 20 05/06/2021  ? GLUCOSE 109 (H) 05/06/2021  ? BUN 11 05/06/2021  ? CREATININE 0.73 05/06/2021  ? BILITOT 1.3 (H) 05/06/2021  ? ALKPHOS 76 04/06/2021  ? AST 51 (H) 05/06/2021  ? ALT 43 05/06/2021  ? PROT 7.7 05/06/2021  ? ALBUMIN 4.6 04/06/2021  ? CALCIUM 9.5 05/06/2021  ? ANIONGAP 12 04/06/2021  ? EGFR 109 05/06/2021  ? ?Lab Results  ?Component Value Date  ? CHOL 175 05/06/2021  ? ?Lab Results  ?Component Value Date  ? HDL 41 05/06/2021  ? ?Lab Results  ?Component Value Date  ? LDLCALC 115 (H) 05/06/2021  ? ?Lab Results  ?Component Value Date  ? TRIG 93 05/06/2021  ? ?Lab Results  ?Component Value Date  ? CHOLHDL 4.3 05/06/2021  ? ?No results found for: HGBA1C ? ?  ?Assessment & Plan:  ? ?Problem List Items Addressed This Visit   ? ?  ? Cardiovascular and Mediastinum  ? Primary hypertension  ?   Patient denies any chest pain or shortness of breath there is no history of palpitation or paroxysmal nocturnal dyspnea ?  patient was advised to follow low-salt low-cholesterol diet ? ?  ideally I want to keep systolic blood pressure below 130 mmHg, patient was asked to check blood pressure one times a week and give me a report on that.  Patient will be follow-up in 3 months  or earlier as needed, patient will call me back for any change in the cardiovascular symptoms ?Patient was advised to buy a book from local bookstore concerning blood pressure and read several chapters  every day.  This will be supplemented by some of the material we will give him from the office.  Patient should also utilize other resources like  YouTube and Internet to learn more about the blood pressure and the  diet. ? ?  ?  ?  ? Musculoskeletal and Integument  ? Dermatitis  ?  Patient will be referred to the dermatologist ? ?  ?  ?  ? Other  ? Tachycardia with hypertension  ?  Under control on beta-blocker ? ?  ?  ? Alcohol abuse - Primary  ?  Patient says that he has stopped drinking ? ?  ?  ? Metabolic syndrome  ?  - I encouraged the patient to lose weight.  ?- I educated them on making healthy dietary choices including eating more fruits and vegetables and less fried foods. ?- I encouraged the patient to exercise more, and educated on the benefits of exercise including weight loss, diabetes prevention, and hypertension prevention.  ? Dietary counseling with a registered dietician ? Referral to a weight management support group (e.g. Weight Watchers, Overeaters Anonymous) ?? If your BMI is greater than 29 or you have gained more than 15 pounds you should work on weight loss. ?? Attend a healthy cooking class  ? ?  ?  ? ? ?No orders of the defined types were placed in this encounter. ? ? ?Follow-up: No follow-ups on file.  ? ? ?Cletis Athens, MD ?

## 2021-05-11 NOTE — Addendum Note (Signed)
Addended by: Melody Comas L on: 05/11/2021 11:11 AM ? ? Modules accepted: Orders ? ?

## 2021-05-24 ENCOUNTER — Other Ambulatory Visit (INDEPENDENT_AMBULATORY_CARE_PROVIDER_SITE_OTHER): Payer: Self-pay | Admitting: Nurse Practitioner

## 2021-05-24 DIAGNOSIS — I739 Peripheral vascular disease, unspecified: Secondary | ICD-10-CM

## 2021-05-27 ENCOUNTER — Encounter (INDEPENDENT_AMBULATORY_CARE_PROVIDER_SITE_OTHER): Payer: Self-pay

## 2021-05-27 ENCOUNTER — Encounter (INDEPENDENT_AMBULATORY_CARE_PROVIDER_SITE_OTHER): Payer: Self-pay | Admitting: Nurse Practitioner

## 2021-08-10 ENCOUNTER — Ambulatory Visit: Payer: No Typology Code available for payment source | Admitting: Internal Medicine

## 2021-09-25 ENCOUNTER — Emergency Department
Admission: EM | Admit: 2021-09-25 | Discharge: 2021-09-25 | Disposition: A | Discharge status: Home / Self Care | Payer: No Typology Code available for payment source | Attending: Emergency Medicine | Admitting: Emergency Medicine

## 2021-09-25 ENCOUNTER — Emergency Department: Payer: No Typology Code available for payment source

## 2021-09-25 DIAGNOSIS — Z20822 Contact with and (suspected) exposure to covid-19: Secondary | ICD-10-CM | POA: Insufficient documentation

## 2021-09-25 DIAGNOSIS — I10 Essential (primary) hypertension: Secondary | ICD-10-CM | POA: Diagnosis not present

## 2021-09-25 DIAGNOSIS — M79671 Pain in right foot: Secondary | ICD-10-CM | POA: Insufficient documentation

## 2021-09-25 DIAGNOSIS — M7989 Other specified soft tissue disorders: Secondary | ICD-10-CM

## 2021-09-25 DIAGNOSIS — R2241 Localized swelling, mass and lump, right lower limb: Secondary | ICD-10-CM | POA: Diagnosis not present

## 2021-09-25 LAB — CBC WITH DIFFERENTIAL/PLATELET
Abs Immature Granulocytes: 0.03 10*3/uL (ref 0.00–0.07)
Basophils Absolute: 0 10*3/uL (ref 0.0–0.1)
Basophils Relative: 1 %
Eosinophils Absolute: 0.1 10*3/uL (ref 0.0–0.5)
Eosinophils Relative: 2 %
HCT: 42.7 % (ref 39.0–52.0)
Hemoglobin: 14.4 g/dL (ref 13.0–17.0)
Immature Granulocytes: 1 %
Lymphocytes Relative: 10 %
Lymphs Abs: 0.6 10*3/uL — ABNORMAL LOW (ref 0.7–4.0)
MCH: 34 pg (ref 26.0–34.0)
MCHC: 33.7 g/dL (ref 30.0–36.0)
MCV: 100.9 fL — ABNORMAL HIGH (ref 80.0–100.0)
Monocytes Absolute: 0.9 10*3/uL (ref 0.1–1.0)
Monocytes Relative: 15 %
Neutro Abs: 4.4 10*3/uL (ref 1.7–7.7)
Neutrophils Relative %: 71 %
Platelets: 89 10*3/uL — ABNORMAL LOW (ref 150–400)
RBC: 4.23 MIL/uL (ref 4.22–5.81)
RDW: 14.6 % (ref 11.5–15.5)
WBC: 6.1 10*3/uL (ref 4.0–10.5)
nRBC: 0 % (ref 0.0–0.2)

## 2021-09-25 LAB — SARS CORONAVIRUS 2 BY RT PCR: SARS Coronavirus 2 by RT PCR: NEGATIVE

## 2021-09-25 LAB — COMPREHENSIVE METABOLIC PANEL
ALT: 34 U/L (ref 0–44)
AST: 62 U/L — ABNORMAL HIGH (ref 15–41)
Albumin: 4.4 g/dL (ref 3.5–5.0)
Alkaline Phosphatase: 70 U/L (ref 38–126)
Anion gap: 14 (ref 5–15)
BUN: 12 mg/dL (ref 6–20)
CO2: 21 mmol/L — ABNORMAL LOW (ref 22–32)
Calcium: 9.7 mg/dL (ref 8.9–10.3)
Chloride: 105 mmol/L (ref 98–111)
Creatinine, Ser: 0.68 mg/dL (ref 0.61–1.24)
GFR, Estimated: >60 mL/min (ref >60)
Glucose, Bld: 156 mg/dL — ABNORMAL HIGH (ref 70–99)
Potassium: 3.9 mmol/L (ref 3.5–5.1)
Sodium: 140 mmol/L (ref 135–145)
Total Bilirubin: 2.6 mg/dL — ABNORMAL HIGH (ref 0.3–1.2)
Total Protein: 8.9 g/dL — ABNORMAL HIGH (ref 6.5–8.1)

## 2021-09-25 LAB — T4, FREE: Free T4: 0.98 ng/dL (ref 0.61–1.12)

## 2021-09-25 LAB — TSH: TSH: 2.825 u[IU]/mL (ref 0.350–4.500)

## 2021-09-25 LAB — LACTIC ACID, PLASMA: Lactic Acid, Venous: 1.5 mmol/L (ref 0.5–1.9)

## 2021-09-25 MED ORDER — CHLORDIAZEPOXIDE HCL 25 MG PO CAPS
25.0000 mg | ORAL_CAPSULE | Freq: Once | ORAL | Status: AC
  Administered 2021-09-25: 25 mg via ORAL
  Filled 2021-09-25: qty 1

## 2021-09-25 MED ORDER — SODIUM CHLORIDE 0.9 % IV BOLUS
1000.0000 mL | Freq: Once | INTRAVENOUS | Status: AC
  Administered 2021-09-25: 1000 mL via INTRAVENOUS

## 2021-09-25 MED ORDER — METOPROLOL SUCCINATE ER 50 MG PO TB24: 50.0000 mg | ORAL_TABLET | Freq: Every day | ORAL | 3 refills | Status: DC

## 2021-09-25 MED ORDER — METOPROLOL SUCCINATE ER 50 MG PO TB24
50.0000 mg | ORAL_TABLET | ORAL | Status: AC
  Administered 2021-09-25: 50 mg via ORAL
  Filled 2021-09-25: qty 1

## 2021-09-25 MED ORDER — LEVETIRACETAM 500 MG PO TABS: 500.0000 mg | ORAL_TABLET | Freq: Once | ORAL | Status: DC

## 2021-09-25 MED ORDER — CEPHALEXIN 500 MG PO CAPS
500.0000 mg | ORAL_CAPSULE | Freq: Once | ORAL | Status: AC
  Administered 2021-09-25: 500 mg via ORAL
  Filled 2021-09-25: qty 1

## 2021-09-25 MED ORDER — CEPHALEXIN 500 MG PO CAPS: 500.0000 mg | ORAL_CAPSULE | Freq: Three times a day (TID) | ORAL | 0 refills | Status: DC

## 2021-09-25 MED ORDER — SULFAMETHOXAZOLE-TRIMETHOPRIM 800-160 MG PO TABS: 1.0000 | ORAL_TABLET | Freq: Two times a day (BID) | ORAL | 0 refills | Status: DC

## 2021-09-25 MED ORDER — SULFAMETHOXAZOLE-TRIMETHOPRIM 800-160 MG PO TABS
1.0000 | ORAL_TABLET | Freq: Once | ORAL | Status: AC
  Administered 2021-09-25: 1 via ORAL
  Filled 2021-09-25: qty 1

## 2021-09-25 NOTE — Discharge Instructions (Signed)
Your lab tests and ultrasound today are all okay.  Your symptoms may be due to developing skin infection in the right leg, and we have sent a prescription for antibiotics to your pharmacy to control this.  Take these medications as prescribed for the next week, and follow-up with Dr. Lavera Guise this week for further evaluation.

## 2021-09-25 NOTE — ED Provider Notes (Signed)
[facility] Provider Note    Event Date/Time   First MD Initiated Contact with Patient 09/25/21 1114     (approximate)   History   Chief Complaint: Foot Pain and Hypertension (C/o high blood pressure non compliant with BP meds, right foot discoloration X3 days, turning purple and hot to the touch. ETOH last drink was last night.)   HPI  Lawrence Rowe is a 51 y.o. male with a history of hypertension who comes the ED complaining of discoloration and swelling of the right leg below the knee for the past 3 days, gradual onset and constant.  Denies any recent falls or trauma.  Denies any pain.  No motor weakness or loss of sensation.  Denies history of DVT.  No chest pain shortness of breath or palpitations.  Reviewed outside medical records, noting that patient was previously seen in primary care where hypertension and tachycardia are described as chronic issues managed by Toprol-XL.     Physical Exam   Triage Vital Signs: ED Triage Vitals  Enc Vitals Group     BP 09/25/21 1116 (!) 187/111     Pulse Rate 09/25/21 1116 (!) 123     Resp 09/25/21 1116 18     Temp 09/25/21 1116 98.5 F (36.9 C)     Temp Source 09/25/21 1116 Oral     SpO2 09/25/21 1116 97 %     Weight --      Height --      Head Circumference --      Peak Flow --      Pain Score 09/25/21 1120 0     Pain Loc --      Pain Edu? --      Excl. in [place]? --     Most recent vital signs: Vitals:   09/25/21 1116  BP: (!) 187/111  Pulse: (!) 123  Resp: 18  Temp: 98.5 F (36.9 C)  SpO2: 97%    General: Awake, no distress.  CV:  Good peripheral perfusion.  Tachycardia heart rate 115.  Symmetric distal pulses. Resp:  Normal effort.  Clear to auscultation bilaterally Abd:  No distention.  Soft nontender Other:  Right leg demonstrates swelling and erythema diffusely below the knee.  Knee is nontender without effusion and has intact range of motion.   ED Results / Procedures /  Treatments   Labs (all labs ordered are listed, but only abnormal results are displayed) Labs Reviewed  COMPREHENSIVE METABOLIC PANEL - Abnormal; Notable for the following components:      Result Value   CO2 21 (*)    Glucose, Bld 156 (*)    Total Protein 8.9 (*)    AST 62 (*)    Total Bilirubin 2.6 (*)    All other components within normal limits  CBC WITH DIFFERENTIAL/PLATELET - Abnormal; Notable for the following components:   MCV 100.9 (*)    Platelets 89 (*)    Lymphs Abs 0.6 (*)    All other components within normal limits  SARS CORONAVIRUS 2 BY RT PCR  LACTIC ACID, PLASMA  TSH  T4, FREE     EKG Interpreted by me Sinus tachycardia rate 113.  Normal axis and intervals.  Normal QRS ST segments and T waves.   RADIOLOGY Ultrasound right lower extremity interpreted by me, negative for DVT.  Radiology report reviewed.   PROCEDURES:  Procedures   MEDICATIONS ORDERED IN ED: Medications  sulfamethoxazole-trimethoprim (BACTRIM DS) 800-160 MG per tablet 1 tablet (  has no administration in time range)  cephALEXin (KEFLEX) capsule 500 mg (has no administration in time range)  sodium chloride 0.9 % bolus 1,000 mL (0 mLs Intravenous Stopped 09/25/21 1320)  metoprolol succinate (TOPROL-XL) 24 hr tablet 50 mg (50 mg Oral Given 09/25/21 1158)  chlordiazePOXIDE (LIBRIUM) capsule 25 mg (25 mg Oral Given 09/25/21 1246)     IMPRESSION / MDM / ASSESSMENT AND PLAN / ED COURSE  I reviewed the triage vital signs and the nursing notes.                              Differential diagnosis includes, but is not limited to, DVT, cellulitis, dermatitis, chronic lymphedema, COVID rash  Patient's presentation is most consistent with acute presentation with potential threat to life or bodily function.  Patient presents with swelling and red/purplish discoloration of the right lower leg for 3 days.  Will obtain labs and ultrasound.  I think his tachycardia is chronic and due to medication  noncompliance as he does report running out of his metoprolol several months ago.  ----------------------------------------- 1:53 PM on 09/25/2021 ----------------------------------------- Work-up was all okay.  He is not requiring admission, not septic.  No evidence of DVT.  Started on Keflex and Bactrim and refill his metoprolol for primary care follow-up.       FINAL CLINICAL IMPRESSION(S) / ED DIAGNOSES   Final diagnoses:  Right leg swelling     Rx / DC Orders   ED Discharge Orders          Ordered    cephALEXin (KEFLEX) 500 MG capsule  3 times daily        09/25/21 1347    sulfamethoxazole-trimethoprim (BACTRIM DS) 800-160 MG tablet  2 times daily        09/25/21 1347    metoprolol succinate (TOPROL-XL) 50 MG 24 hr tablet  Daily        09/25/21 1353             Note:  This document was prepared using Dragon voice recognition software and may include unintentional dictation errors.   Sharman Cheek, MD 09/25/21 1354

## 2021-10-06 ENCOUNTER — Ambulatory Visit (INDEPENDENT_AMBULATORY_CARE_PROVIDER_SITE_OTHER): Payer: No Typology Code available for payment source | Admitting: Dermatology

## 2021-10-06 DIAGNOSIS — L03119 Cellulitis of unspecified part of limb: Secondary | ICD-10-CM

## 2021-10-06 DIAGNOSIS — I8311 Varicose veins of right lower extremity with inflammation: Secondary | ICD-10-CM

## 2021-10-06 MED ORDER — MUPIROCIN 2 % EX OINT: TOPICAL_OINTMENT | CUTANEOUS | 0 refills | Status: DC

## 2021-10-06 MED ORDER — SULFAMETHOXAZOLE-TRIMETHOPRIM 800-160 MG PO TABS: ORAL_TABLET | ORAL | 0 refills | Status: DC

## 2021-10-06 MED ORDER — CEPHALEXIN 500 MG PO CAPS: 500.0000 mg | ORAL_CAPSULE | Freq: Three times a day (TID) | ORAL | 0 refills | Status: DC

## 2021-10-06 NOTE — Patient Instructions (Addendum)
Cerave cream moisturizer apply to legs daily     Due to recent changes in healthcare laws, you may see results of your pathology and/or laboratory studies on MyChart before the doctors have had a chance to review them. We understand that in some cases there may be results that are confusing or concerning to you. Please understand that not all results are received at the same time and often the doctors may need to interpret multiple results in order to provide you with the best plan of care or course of treatment. Therefore, we ask that you please give [place] 2 business days to thoroughly review all your results before contacting the office for clarification. Should we see a critical lab result, you will be contacted sooner.   If You Need Anything After Your Visit  If you have any questions or concerns for your doctor, please call our main line at [phone] and press option 4 to reach your doctor's medical assistant. If no one answers, please leave a voicemail as directed and we will return your call as soon as possible. Messages left after 4 pm will be answered the following business day.   You may also send [place] a message via [place]. We typically respond to MyChart messages within 1-2 business days.  For prescription refills, please ask your pharmacy to contact our office. Our fax number is [phone].  If you have an urgent issue when the clinic is closed that cannot wait until the next business day, you can page your doctor at the number below.    Please note that while we do our best to be available for urgent issues outside of office hours, we are not available 24/7.   If you have an urgent issue and are unable to reach [place], you may choose to seek medical care at your doctor's office, retail clinic, urgent care center, or emergency room.  If you have a medical emergency, please immediately call 911 or go to the emergency department.  Pager Numbers  - Dr. Nehemiah Massed: [phone]  - Dr.  Laurence Ferrari: [phone]  - Dr. Nicole Kindred: [phone]  In the event of inclement weather, please call our main line at [phone] for an update on the status of any delays or closures.  Dermatology Medication Tips: Please keep the boxes that topical medications come in in order to help keep track of the instructions about where and how to use these. Pharmacies typically print the medication instructions only on the boxes and not directly on the medication tubes.   If your medication is too expensive, please contact our office at [phone] option 4 or send [place] a message through [place].   We are unable to tell what your co-pay for medications will be in advance as this is different depending on your insurance coverage. However, we may be able to find a substitute medication at lower cost or fill out paperwork to get insurance to cover a needed medication.   If a prior authorization is required to get your medication covered by your insurance company, please allow [place] 1-2 business days to complete this process.  Drug prices often vary depending on where the prescription is filled and some pharmacies may offer cheaper prices.  The website www.goodrx.com contains coupons for medications through different pharmacies. The prices here do not account for what the cost may be with help from insurance (it may be cheaper with your insurance), but the website can give you the price if you did not use any insurance.  -  You can print the associated coupon and take it with your prescription to the pharmacy.  - You may also stop by our office during regular business hours and pick up a GoodRx coupon card.  - If you need your prescription sent electronically to a different pharmacy, notify our office through [facility] or by phone at [phone] option 4.     Si Usted Necesita Algo Despus de Su Visita  Tambin puede enviarnos un mensaje a travs de Pharmacist, community. Por lo general respondemos a los mensajes  de MyChart en el transcurso de 1 a 2 das hbiles.  Para renovar recetas, por favor pida a su farmacia que se ponga en contacto con nuestra oficina. Harland Dingwall de fax es [place] [phone].  Si tiene un asunto urgente cuando la clnica est cerrada y que no puede esperar hasta el siguiente da hbil, puede llamar/localizar a su doctor(a) al nmero que aparece a continuacin.   Por favor, tenga en cuenta que aunque hacemos todo lo posible para estar disponibles para asuntos urgentes fuera del horario de [place], no estamos disponibles las 24 horas del da, los 7 das de la [place].   Si tiene un problema urgente y no puede comunicarse con nosotros, puede optar por buscar atencin mdica  en el consultorio de su doctor(a), en una clnica privada, en un centro de atencin urgente o en una sala de emergencias.  Si tiene Engineering geologist, por favor llame inmediatamente al 911 o vaya a la sala de emergencias.  Nmeros de bper  - Dr. Nehemiah Massed: [phone]  - Dra. Moye: [phone]  - Dra. Nicole Kindred: [phone]  En caso de inclemencias del [place], por favor llame a Johnsie Kindred principal al [phone] para una actualizacin sobre el [place] de cualquier retraso o cierre.  Consejos para la medicacin en dermatologa: Por favor, guarde las cajas en las que vienen los medicamentos de uso tpico para ayudarle a seguir las instrucciones sobre dnde y cmo usarlos. Las farmacias generalmente imprimen las instrucciones del medicamento slo en las cajas y no directamente en los tubos del [place].   Si su medicamento es muy caro, por favor, pngase en contacto con Zigmund Daniel llamando al [phone] y presione la opcin 4 o envenos un mensaje a travs de Pharmacist, community.   No podemos decirle cul ser su copago por los medicamentos por adelantado ya que esto es diferente dependiendo de la cobertura de su seguro. Sin embargo, es posible que podamos encontrar un medicamento sustituto a Actor un formulario para que el seguro cubra el medicamento que se considera necesario.   Si se requiere una autorizacin previa para que su compaa de seguros [place] su medicamento, por favor permtanos de 1 a 2 das hbiles para completar este proceso.  Los precios de los medicamentos varan con frecuencia dependiendo del Environmental consultant de dnde se surte la receta y alguna farmacias pueden ofrecer precios ms baratos.  El sitio web www.goodrx.com tiene cupones para medicamentos de Airline pilot. Los precios aqu no tienen en cuenta lo que podra costar con la ayuda del seguro (puede ser ms barato con su seguro), pero el sitio web puede darle el precio si no utiliz Research scientist (physical sciences).  - Puede imprimir el cupn correspondiente y llevarlo con su receta a la farmacia.  - Tambin puede pasar por nuestra oficina durante el horario de atencin regular y Charity fundraiser una tarjeta de cupones de GoodRx.  - Si necesita que su receta se enve electrnicamente a una farmacia diferente, informe  a nuestra oficina a travs de MyChart de [place] o por telfono llamando al [phone] y presione la opcin 4.

## 2021-10-06 NOTE — Progress Notes (Signed)
   New Patient Visit  Subjective  Lawrence Rowe is a 51 y.o. male who presents for the following: Rash (Patient c/o a rash on the lower legs and feet x 2 weeks patient was seen at the ER Sept 17, 2023 and  prescribed Cephalexin tablets and Bactrim ds for 7 days each- finished 4 days ago, patient report skin look better he just want to be for sure rash is getting better. ).  The following portions of the chart were reviewed this encounter and updated as appropriate:   Tobacco  Allergies  Meds  Problems  Med Hx  Surg Hx  Fam Hx     Review of Systems:  No other skin or systemic complaints except as noted in HPI or Assessment and Plan.  Objective  Well appearing patient in no apparent distress; mood and affect are within normal limits.  A focused examination was performed including right leg,left leg,feet. Relevant physical exam findings are noted in the Assessment and Plan.  lower legs maceration web spaces      Assessment & Plan  Cellulitis of lower extremity, unspecified laterality Appears resolved after appropriate treatment but with some remaining sequelae lower legs -much worse on right lower leg  Cellulitis- improving.  Does not appear to be actively infected but there is still some erythema edema and dyschromia especially of the right lower leg.  Advised patient that we will take time for this to calm down more and resolve and it could be weeks to months.  Understands.  Advised if any evidence of worsening or recurrence, he should refill his antibiotics and start taking.  Start Mupirocin ointment apply to affected skin qd-bid  Start otc Cerave cream apply to legs daily   We will send in a refill of Bactrim ds and Cephalexin tablets  pt instructed to only fill these prescriptions if cellulitis appears to be recurring.  We will recheck in 2 weeks   Related Medications mupirocin ointment (BACTROBAN) 2 % Apply to skin qd-bid  sulfamethoxazole-trimethoprim  (BACTRIM DS) 800-160 MG tablet Take 1 tablet x 7 days  cephALEXin (KEFLEX) 500 MG capsule Take 1 capsule (500 mg total) by mouth 3 (three) times daily.  Varicose Veins/Spider Veins Lower legs - Dilated blue, purple or red veins at the lower extremities - Reassured - Smaller vessels can be treated by sclerotherapy (a procedure to inject a medicine into the veins to make them disappear) if desired, but the treatment is not covered by insurance. Larger vessels may be covered if symptomatic and we would refer to vascular surgeon if treatment desired.   Return in about 2 weeks (around 10/20/2021) for cellulitis.  IMarye Round, CMA, am acting as scribe for Sarina Ser, MD .  Documentation: I have reviewed the above documentation for accuracy and completeness, and I agree with the above.  Sarina Ser, MD

## 2021-10-07 ENCOUNTER — Encounter: Payer: Self-pay | Admitting: Dermatology

## 2021-10-20 ENCOUNTER — Ambulatory Visit: Payer: No Typology Code available for payment source | Admitting: Dermatology

## 2021-11-07 ENCOUNTER — Encounter (INDEPENDENT_AMBULATORY_CARE_PROVIDER_SITE_OTHER): Payer: Self-pay

## 2021-11-14 ENCOUNTER — Ambulatory Visit: Payer: No Typology Code available for payment source | Admitting: Dermatology

## 2021-12-23 ENCOUNTER — Emergency Department: Payer: No Typology Code available for payment source

## 2021-12-23 ENCOUNTER — Inpatient Hospital Stay
Admission: EM | Admit: 2021-12-23 | Discharge: 2021-12-31 | DRG: 897 | Disposition: A | Discharge status: Home / Self Care | Payer: No Typology Code available for payment source | Attending: Family Medicine | Admitting: Family Medicine

## 2021-12-23 DIAGNOSIS — I10 Essential (primary) hypertension: Secondary | ICD-10-CM | POA: Diagnosis not present

## 2021-12-23 DIAGNOSIS — R600 Localized edema: Secondary | ICD-10-CM | POA: Diagnosis not present

## 2021-12-23 DIAGNOSIS — D6959 Other secondary thrombocytopenia: Secondary | ICD-10-CM | POA: Diagnosis present

## 2021-12-23 DIAGNOSIS — R000 Tachycardia, unspecified: Secondary | ICD-10-CM | POA: Diagnosis not present

## 2021-12-23 DIAGNOSIS — I739 Peripheral vascular disease, unspecified: Secondary | ICD-10-CM | POA: Diagnosis present

## 2021-12-23 DIAGNOSIS — E876 Hypokalemia: Secondary | ICD-10-CM | POA: Diagnosis not present

## 2021-12-23 DIAGNOSIS — F10231 Alcohol dependence with withdrawal delirium: Secondary | ICD-10-CM | POA: Diagnosis present

## 2021-12-23 DIAGNOSIS — Z781 Physical restraint status: Secondary | ICD-10-CM | POA: Diagnosis not present

## 2021-12-23 DIAGNOSIS — F10931 Alcohol use, unspecified with withdrawal delirium: Secondary | ICD-10-CM | POA: Insufficient documentation

## 2021-12-23 DIAGNOSIS — Z880 Allergy status to penicillin: Secondary | ICD-10-CM | POA: Diagnosis not present

## 2021-12-23 DIAGNOSIS — R7401 Elevation of levels of liver transaminase levels: Secondary | ICD-10-CM | POA: Diagnosis not present

## 2021-12-23 DIAGNOSIS — F10251 Alcohol dependence with alcohol-induced psychotic disorder with hallucinations: Secondary | ICD-10-CM | POA: Diagnosis not present

## 2021-12-23 DIAGNOSIS — E512 Wernicke's encephalopathy: Secondary | ICD-10-CM | POA: Insufficient documentation

## 2021-12-23 DIAGNOSIS — F419 Anxiety disorder, unspecified: Secondary | ICD-10-CM | POA: Diagnosis present

## 2021-12-23 DIAGNOSIS — Z1152 Encounter for screening for COVID-19: Secondary | ICD-10-CM | POA: Diagnosis not present

## 2021-12-23 DIAGNOSIS — F10930 Alcohol use, unspecified with withdrawal, uncomplicated: Secondary | ICD-10-CM | POA: Diagnosis not present

## 2021-12-23 DIAGNOSIS — F10939 Alcohol use, unspecified with withdrawal, unspecified: Secondary | ICD-10-CM | POA: Diagnosis not present

## 2021-12-23 DIAGNOSIS — Z87891 Personal history of nicotine dependence: Secondary | ICD-10-CM | POA: Diagnosis not present

## 2021-12-23 DIAGNOSIS — Z79899 Other long term (current) drug therapy: Secondary | ICD-10-CM | POA: Diagnosis not present

## 2021-12-23 DIAGNOSIS — K7010 Alcoholic hepatitis without ascites: Secondary | ICD-10-CM | POA: Insufficient documentation

## 2021-12-23 DIAGNOSIS — I878 Other specified disorders of veins: Secondary | ICD-10-CM | POA: Diagnosis present

## 2021-12-23 DIAGNOSIS — F1021 Alcohol dependence, in remission: Secondary | ICD-10-CM | POA: Diagnosis present

## 2021-12-23 DIAGNOSIS — F1091 Alcohol use, unspecified, in remission: Secondary | ICD-10-CM | POA: Diagnosis present

## 2021-12-23 DIAGNOSIS — F1010 Alcohol abuse, uncomplicated: Secondary | ICD-10-CM | POA: Diagnosis present

## 2021-12-23 LAB — COMPREHENSIVE METABOLIC PANEL
ALT: 63 U/L — ABNORMAL HIGH (ref 0–44)
AST: 100 U/L — ABNORMAL HIGH (ref 15–41)
Albumin: 5 g/dL (ref 3.5–5.0)
Alkaline Phosphatase: 60 U/L (ref 38–126)
Anion gap: 16 — ABNORMAL HIGH (ref 5–15)
BUN: 12 mg/dL (ref 6–20)
CO2: 17 mmol/L — ABNORMAL LOW (ref 22–32)
Calcium: 9.8 mg/dL (ref 8.9–10.3)
Chloride: 108 mmol/L (ref 98–111)
Creatinine, Ser: 0.6 mg/dL — ABNORMAL LOW (ref 0.61–1.24)
GFR, Estimated: >60 mL/min (ref >60)
Glucose, Bld: 113 mg/dL — ABNORMAL HIGH (ref 70–99)
Potassium: 3.9 mmol/L (ref 3.5–5.1)
Sodium: 141 mmol/L (ref 135–145)
Total Bilirubin: 1.8 mg/dL — ABNORMAL HIGH (ref 0.3–1.2)
Total Protein: 9.5 g/dL — ABNORMAL HIGH (ref 6.5–8.1)

## 2021-12-23 LAB — CBC WITH DIFFERENTIAL/PLATELET
Abs Immature Granulocytes: 0.01 10*3/uL (ref 0.00–0.07)
Basophils Absolute: 0.1 10*3/uL (ref 0.0–0.1)
Basophils Relative: 1 %
Eosinophils Absolute: 0 10*3/uL (ref 0.0–0.5)
Eosinophils Relative: 0 %
HCT: 43.5 % (ref 39.0–52.0)
Hemoglobin: 15 g/dL (ref 13.0–17.0)
Immature Granulocytes: 0 %
Lymphocytes Relative: 8 %
Lymphs Abs: 0.6 10*3/uL — ABNORMAL LOW (ref 0.7–4.0)
MCH: 34.5 pg — ABNORMAL HIGH (ref 26.0–34.0)
MCHC: 34.5 g/dL (ref 30.0–36.0)
MCV: 100 fL (ref 80.0–100.0)
Monocytes Absolute: 1 10*3/uL (ref 0.1–1.0)
Monocytes Relative: 14 %
Neutro Abs: 5.4 10*3/uL (ref 1.7–7.7)
Neutrophils Relative %: 77 %
Platelets: 102 10*3/uL — ABNORMAL LOW (ref 150–400)
RBC: 4.35 MIL/uL (ref 4.22–5.81)
RDW: 15 % (ref 11.5–15.5)
WBC: 7.1 10*3/uL (ref 4.0–10.5)
nRBC: 0 % (ref 0.0–0.2)

## 2021-12-23 LAB — TSH: TSH: 1.58 u[IU]/mL (ref 0.350–4.500)

## 2021-12-23 LAB — MAGNESIUM: Magnesium: 1.9 mg/dL (ref 1.7–2.4)

## 2021-12-23 MED ORDER — LORAZEPAM 2 MG PO TABS: 0.0000 mg | ORAL_TABLET | Freq: Two times a day (BID) | ORAL | Status: DC

## 2021-12-23 MED ORDER — LORAZEPAM 2 MG PO TABS
0.0000 mg | ORAL_TABLET | Freq: Four times a day (QID) | ORAL | Status: DC
  Administered 2021-12-23: 1 mg via ORAL
  Filled 2021-12-23: qty 1

## 2021-12-23 MED ORDER — LORAZEPAM 2 MG/ML IJ SOLN
1.0000 mg | Freq: Once | INTRAMUSCULAR | Status: AC
  Administered 2021-12-23: 1 mg via INTRAVENOUS
  Filled 2021-12-23: qty 1

## 2021-12-23 MED ORDER — LORAZEPAM 2 MG/ML IJ SOLN
1.0000 mg | INTRAMUSCULAR | Status: DC | PRN
  Administered 2021-12-24: 2 mg via INTRAVENOUS
  Administered 2021-12-24: 3 mg via INTRAVENOUS
  Filled 2021-12-23: qty 2
  Filled 2021-12-23: qty 1
  Filled 2021-12-23: qty 2

## 2021-12-23 MED ORDER — LORAZEPAM 2 MG/ML IJ SOLN
2.0000 mg | Freq: Once | INTRAMUSCULAR | Status: AC
  Administered 2021-12-23: 2 mg via INTRAVENOUS
  Filled 2021-12-23: qty 1

## 2021-12-23 MED ORDER — LORAZEPAM 2 MG/ML IJ SOLN
0.0000 mg | Freq: Four times a day (QID) | INTRAMUSCULAR | Status: DC
  Administered 2021-12-23 – 2021-12-24 (×2): 2 mg via INTRAVENOUS
  Administered 2021-12-24: 1 mg via INTRAVENOUS
  Administered 2021-12-24: 3 mg via INTRAVENOUS
  Filled 2021-12-23: qty 1
  Filled 2021-12-23: qty 2
  Filled 2021-12-23 (×2): qty 1

## 2021-12-23 MED ORDER — SODIUM CHLORIDE 0.9 % IV BOLUS
1000.0000 mL | Freq: Once | INTRAVENOUS | Status: AC
  Administered 2021-12-23: 1000 mL via INTRAVENOUS

## 2021-12-23 MED ORDER — ONDANSETRON HCL 4 MG/2ML IJ SOLN: 4.0000 mg | Freq: Four times a day (QID) | INTRAMUSCULAR | Status: DC | PRN

## 2021-12-23 MED ORDER — LORAZEPAM 2 MG/ML IJ SOLN: 0.0000 mg | Freq: Two times a day (BID) | INTRAMUSCULAR | Status: DC

## 2021-12-23 MED ORDER — LORAZEPAM 2 MG/ML IJ SOLN
0.0000 mg | Freq: Four times a day (QID) | INTRAMUSCULAR | Status: DC
  Administered 2021-12-23: 2 mg via INTRAVENOUS
  Filled 2021-12-23: qty 1

## 2021-12-23 MED ORDER — MORPHINE SULFATE (PF) 2 MG/ML IV SOLN
2.0000 mg | INTRAVENOUS | Status: DC | PRN
  Administered 2021-12-24 – 2021-12-25 (×4): 2 mg via INTRAVENOUS
  Filled 2021-12-23 (×4): qty 1

## 2021-12-23 MED ORDER — HEPARIN SODIUM (PORCINE) 5000 UNIT/ML IJ SOLN
5000.0000 [IU] | Freq: Three times a day (TID) | INTRAMUSCULAR | Status: DC
  Administered 2021-12-23 – 2021-12-26 (×8): 5000 [IU] via SUBCUTANEOUS
  Filled 2021-12-23 (×8): qty 1

## 2021-12-23 MED ORDER — ACETAMINOPHEN 325 MG PO TABS: 650.0000 mg | ORAL_TABLET | Freq: Four times a day (QID) | ORAL | Status: DC | PRN

## 2021-12-23 MED ORDER — CHLORDIAZEPOXIDE HCL 25 MG PO CAPS
50.0000 mg | ORAL_CAPSULE | Freq: Once | ORAL | Status: AC
  Administered 2021-12-23: 50 mg via ORAL
  Filled 2021-12-23: qty 2

## 2021-12-23 MED ORDER — METOPROLOL SUCCINATE ER 50 MG PO TB24
50.0000 mg | ORAL_TABLET | Freq: Every day | ORAL | Status: DC
  Administered 2021-12-23 – 2021-12-31 (×8): 50 mg via ORAL
  Filled 2021-12-23 (×8): qty 1

## 2021-12-23 MED ORDER — OXYCODONE HCL 5 MG PO TABS
5.0000 mg | ORAL_TABLET | ORAL | Status: DC | PRN
  Administered 2021-12-27: 5 mg via ORAL
  Filled 2021-12-23: qty 1

## 2021-12-23 MED ORDER — SODIUM CHLORIDE 0.9 % IV SOLN
INTRAVENOUS | Status: DC
  Administered 2021-12-24: 1000 mL via INTRAVENOUS

## 2021-12-23 MED ORDER — LORAZEPAM 1 MG PO TABS
1.0000 mg | ORAL_TABLET | ORAL | Status: DC | PRN
  Administered 2021-12-24: 1 mg via ORAL
  Administered 2021-12-24: 4 mg via ORAL
  Filled 2021-12-23: qty 1
  Filled 2021-12-23: qty 4

## 2021-12-23 MED ORDER — ADULT MULTIVITAMIN W/MINERALS CH
1.0000 | ORAL_TABLET | Freq: Every day | ORAL | Status: DC
  Administered 2021-12-23 – 2021-12-31 (×8): 1 via ORAL
  Filled 2021-12-23 (×8): qty 1

## 2021-12-23 MED ORDER — THIAMINE MONONITRATE 100 MG PO TABS
100.0000 mg | ORAL_TABLET | Freq: Every day | ORAL | Status: DC
  Administered 2021-12-23 – 2021-12-26 (×3): 100 mg via ORAL
  Filled 2021-12-23 (×3): qty 1

## 2021-12-23 MED ORDER — ACETAMINOPHEN 650 MG RE SUPP: 650.0000 mg | Freq: Four times a day (QID) | RECTAL | Status: DC | PRN

## 2021-12-23 MED ORDER — FOLIC ACID 1 MG PO TABS
1.0000 mg | ORAL_TABLET | Freq: Every day | ORAL | Status: DC
  Administered 2021-12-23 – 2021-12-24 (×2): 1 mg via ORAL
  Filled 2021-12-23 (×2): qty 1

## 2021-12-23 MED ORDER — THIAMINE HCL 100 MG/ML IJ SOLN
100.0000 mg | Freq: Every day | INTRAMUSCULAR | Status: DC
  Administered 2021-12-25 – 2021-12-27 (×2): 100 mg via INTRAVENOUS
  Filled 2021-12-23 (×4): qty 2

## 2021-12-23 MED ORDER — ONDANSETRON HCL 4 MG PO TABS: 4.0000 mg | ORAL_TABLET | Freq: Four times a day (QID) | ORAL | Status: DC | PRN

## 2021-12-23 NOTE — Assessment & Plan Note (Signed)
-  50mg  Librium in ED -Required a total of 7mg  Ativan in ED -Continue CIWA -Continue seizure precautions -Continue thiamine and folic acid supplementation -Baseline 12pack/day -Last drink 8:30pm 12/22/21 - was the 12th drink of the day

## 2021-12-23 NOTE — Assessment & Plan Note (Signed)
-  AST up to 100 from previous -ALT up to 63 from previous -Likely related to EtOH abuse -Fluid bolus in ED -Hold hepatotoxic agents when possible -RUQ [place] -Trend in the AM

## 2021-12-23 NOTE — ED Provider Triage Note (Signed)
Emergency Medicine Provider Triage Evaluation Note  Lawrence Rowe , a 52 y.o. male  was evaluated in triage.  Pt complains of ETOH withdrawal. Typically drinks 12 pack/day. No hallucinations, nausea, or vomiting.  He is shaking and sweating. No history of seizures.  Physical Exam  BP (!) 162/95   Pulse (!) 139  Gen:   Awake, no distress   Resp:  Normal effort  MSK:   Moves extremities without difficulty  Other:  Hands shaking, face erythematous  Medical Decision Making  Medically screening exam initiated at 4:11 PM.  Appropriate orders placed.  [name] was informed that the remainder of the evaluation will be completed by another provider, this initial triage assessment does not replace that evaluation, and the importance of remaining in the ED until their evaluation is complete.  Patient noted to be tachycardic and shaking uncontrollably. Ativan 1mg  and IV NS ordered. Will put in sub-wait.   , FNP 12/23/21 [phone]

## 2021-12-23 NOTE — Assessment & Plan Note (Signed)
-  HR 120s -BP 160s/100s at admission -Continue home dose of metoprolol -Continue to treat EtOH withdrawal -Continue to monitor

## 2021-12-23 NOTE — Assessment & Plan Note (Signed)
-  right lower extremity edema with venous stasis changes of the skin -[place] R lower extremity negative for DVT -Low suspicion for infection -Continue to monitor

## 2021-12-23 NOTE — ED Provider Notes (Signed)
[facility] Provider Note    Event Date/Time   First MD Initiated Contact with Patient 12/23/21 1707     (approximate)   History   Withdrawal (Patient is a 12-pack per day beer drinker - last drink was last night at 20:30, CIWA 13)   HPI  Lawrence Rowe is a 51 y.o. male presents to the ER for evaluation of tremulousness anxiety cramps that started midday today.  Last drink of alcohol was around 8 PM last night.  States that he drinks 12 pack/day.  No history of DTs or seizures.  He is interested in sobriety.     Physical Exam   Triage Vital Signs: ED Triage Vitals  Enc Vitals Group     BP 12/23/21 1609 (!) 162/95     Pulse Rate 12/23/21 1609 (!) 139     Resp 12/23/21 1611 20     Temp 12/23/21 1611 97.7 F (36.5 C)     Temp Source 12/23/21 1611 Oral     SpO2 12/23/21 1611 96 %     Weight 12/23/21 1611 200 lb (90.7 kg)     Height 12/23/21 1611 6' (1.829 m)     Head Circumference --      Peak Flow --      Pain Score --      Pain Loc --      Pain Edu? --      Excl. in [place]? --     Most recent vital signs: Vitals:   12/23/21 1800 12/23/21 1900  BP: (!) 180/105 (!) 158/95  Pulse: (!) 116 (!) 134  Resp: 19 14  Temp:    SpO2: 94% 94%     Constitutional: Alert  Eyes: Conjunctivae are normal.  Head: Atraumatic. Nose: No congestion/rhinnorhea. Mouth/Throat: Mucous membranes are moist.   Neck: Painless ROM.  Cardiovascular:   Good peripheral circulation. Milldy tachycardic but well perfused Respiratory: Normal respiratory effort.  No retractions.  Gastrointestinal: Soft and nontender.  Musculoskeletal:  no deformity Neurologic:  MAE spontaneously. No gross focal neurologic deficits are appreciated. + tongue, face, hand fasciculations Skin:  Skin is warm, dry and intact. No rash noted. Psychiatric: Mood and affect are normal. Speech and behavior are normal.    ED Results / Procedures / Treatments   Labs (all labs ordered are  listed, but only abnormal results are displayed) Labs Reviewed  COMPREHENSIVE METABOLIC PANEL - Abnormal; Notable for the following components:      Result Value   CO2 17 (*)    Glucose, Bld 113 (*)    Creatinine, Ser 0.60 (*)    Total Protein 9.5 (*)    AST 100 (*)    ALT 63 (*)    Total Bilirubin 1.8 (*)    Anion gap 16 (*)    All other components within normal limits  CBC WITH DIFFERENTIAL/PLATELET - Abnormal; Notable for the following components:   MCH 34.5 (*)    Platelets 102 (*)    Lymphs Abs 0.6 (*)    All other components within normal limits  MAGNESIUM     EKG  ED ECG REPORT I, Willy Eddy, the attending physician, personally viewed and interpreted this ECG.   Date: 12/23/2021  EKG Time: 16:33  Rate: 125  Rhythm: sinus  Axis: normal  Intervals: normal  ST&T Change: no stemi, no depressions    PROCEDURES:  Critical Care performed: Yes, see critical care procedure note(s)  .Critical Care  Performed by: Willy Eddy, MD  Authorized by: Willy Eddy, MD   Critical care provider statement:    Critical care time (minutes):  35   Critical care was necessary to treat or prevent imminent or life-threatening deterioration of the following conditions: alcohol withdrawal.   Critical care was time spent personally by me on the following activities:  Ordering and performing treatments and interventions, ordering and review of laboratory studies, ordering and review of radiographic studies, pulse oximetry, re-evaluation of patient's condition, review of old charts, obtaining history from patient or surrogate, examination of patient, evaluation of patient's response to treatment, discussions with primary provider, discussions with consultants and development of treatment plan with patient or surrogate    MEDICATIONS ORDERED IN ED: Medications  LORazepam (ATIVAN) injection 0-4 mg (2 mg Intravenous Given 12/23/21 1831)    Or  LORazepam (ATIVAN) tablet 0-4  mg ( Oral See Alternative 12/23/21 1831)  LORazepam (ATIVAN) injection 0-4 mg (has no administration in time range)    Or  LORazepam (ATIVAN) tablet 0-4 mg (has no administration in time range)  thiamine (VITAMIN B1) tablet 100 mg (100 mg Oral Given 12/23/21 1745)    Or  thiamine (VITAMIN B1) injection 100 mg ( Intravenous See Alternative 12/23/21 1745)  LORazepam (ATIVAN) injection 1 mg (1 mg Intravenous Given 12/23/21 1624)  sodium chloride 0.9 % bolus 1,000 mL (0 mLs Intravenous Stopped 12/23/21 1917)  chlordiazePOXIDE (LIBRIUM) capsule 50 mg (50 mg Oral Given 12/23/21 1744)  sodium chloride 0.9 % bolus 1,000 mL (1,000 mLs Intravenous New Bag/Given 12/23/21 1920)  LORazepam (ATIVAN) injection 2 mg (2 mg Intravenous Given 12/23/21 1919)     IMPRESSION / MDM / ASSESSMENT AND PLAN / ED COURSE  I reviewed the triage vital signs and the nursing notes.                              Differential diagnosis includes, but is not limited to, withdrawal, electrolyte abnormality, hypoglycemia, seizure,  Patient presenting to the ER for evaluation of symptoms as described above.  Based on symptoms, risk factors and considered above differential, this presenting complaint could reflect a potentially life-threatening illness therefore the patient will be placed on continuous pulse oximetry and telemetry for monitoring.  Laboratory evaluation will be sent to evaluate for the above complaints.  Patient will be placed on CIWA protocol as the presentation is concerning for acute alcohol withdrawal.   Clinical Course as of 12/23/21 1936  Fri Dec 23, 2021  1911 Patient persistently tachycardic hypertensive tremulous.  This is after multiple doses of both IV and p.o. benzos.  He also noted that he has noticed some discoloration of his right leg and discomfort in the past few days.  It is well-perfused with some chronic appearing venous stasis changes does not appear infected.  Will order ultrasound.  This  point do believe he is can require admission to the hospital for alcohol withdrawal.  Have consulted the hospitalist. [PR]    Clinical Course User Index [PR] Willy Eddy, MD    FINAL CLINICAL IMPRESSION(S) / ED DIAGNOSES   Final diagnoses:  Alcohol withdrawal syndrome with complication [facility])     Rx / DC Orders   ED Discharge Orders     None        Note:  This document was prepared using Dragon voice recognition software and may include unintentional dictation errors.    Willy Eddy, MD 12/23/21 [phone]

## 2021-12-23 NOTE — H&P (Signed)
History and Physical    Patient: Lawrence Rowe HYW:[id number] DOB: [birth year] DOA: 12/23/2021 DOS: the patient was seen and examined on 12/23/2021 PCP: Baldwin Jamaica, MD  Patient coming from: Home  Chief Complaint:  Chief Complaint  Patient presents with   Withdrawal    Patient is a 12-pack per day beer drinker - last drink was last night at 20:30, CIWA 13   HPI: Lawrence Rowe is a 51 y.o. male with medical history significant of alcohol abuse with history of primary hypertension, presents to the ED with a chief complaint of alcohol withdrawal.  Patient reports his withdrawal symptoms started this morning.  They were gradual in onset.  His primary concern is cramping in his feet.  He is also noticed tremulousness, anxiousness, palpitations.  Patient denies diaphoresis, nausea, vomiting, abdominal pain, chest pain.  Patient reports his last drink was 8:30 PM last night.  He drinks 12 drinks per day, and did indeed have 12 drinks yesterday.  He has tried to quit before.  The last time he tried to quit was several months ago.  He had very similar withdrawal symptoms as today.  He has never had a withdrawal seizure.  Patient has no other complaints at this time.  Patient is a former smoker who quit 4 years ago.  He does not use illicit drugs.  He is vaccinated for COVID.  Patient is full code. Review of Systems: As mentioned in the history of present illness. All other systems reviewed and are negative. Past Medical History:  Diagnosis Date   Alcohol abuse    Past Surgical History:  Procedure Laterality Date   ELBOW SURGERY Left    Social History:  reports that he quit smoking about 3 years ago. His smoking use included cigarettes. He has never used smokeless tobacco. He reports current alcohol use of about 12.0 standard drinks of alcohol per week. He reports that he does not use drugs.  Allergies  Allergen Reactions   Penicillins Other (See Comments)    No family  history on file.  Prior to Admission medications   Medication Sig Start Date End Date Taking? Authorizing Provider  metoprolol succinate (TOPROL-XL) 50 MG 24 hr tablet Take 1 tablet (50 mg total) by mouth daily. Take with or immediately following a meal. 09/25/21  Yes Sharman Cheek, MD  ALPRAZolam Prudy Feeler) 1 MG tablet Take 1 mg by mouth at bedtime as needed for anxiety. Take 1 and half tablets daily prn Patient not taking: Reported on 12/23/2021    [provider]  cephALEXin (KEFLEX) 500 MG capsule Take 1 capsule (500 mg total) by mouth 3 (three) times daily. Patient not taking: Reported on 12/23/2021 10/06/21   Deirdre Evener, MD  mupirocin ointment Idelle Jo) 2 % Apply to skin qd-bid Patient not taking: Reported on 12/23/2021 10/06/21   Deirdre Evener, MD  sulfamethoxazole-trimethoprim (BACTRIM DS) 800-160 MG tablet Take 1 tablet by mouth 2 (two) times daily. Patient not taking: Reported on 12/23/2021 09/25/21   Sharman Cheek, MD  sulfamethoxazole-trimethoprim (BACTRIM DS) 800-160 MG tablet Take 1 tablet x 7 days Patient not taking: Reported on 12/23/2021 10/06/21   Deirdre Evener, MD    Physical Exam: Vitals:   12/23/21 1738 12/23/21 1800 12/23/21 1900 12/23/21 2000  BP: (!) 179/100 (!) 180/105 (!) 158/95 (!) 163/102  Pulse: (!) 109 (!) 116 (!) 134 (!) 122  Resp:  19 14 18   Temp:      TempSrc:      SpO2:  94% 94% 95%  Weight:      Height:       1.  General: Patient lying supine in bed,  no acute distress   2. Psychiatric: Alert and oriented x 3, mood and behavior normal for situation, pleasant and cooperative with exam   3. Neurologic: Speech and language are normal, face is symmetric, moves all 4 extremities voluntarily, tremulous, at baseline without acute deficits on limited exam   4. HEENMT:  Head is atraumatic, normocephalic, pupils reactive to light, neck is supple, trachea is midline, mucous membranes are moist   5. Respiratory : Lungs are  clear to auscultation bilaterally without wheezing, rhonchi, rales, no cyanosis, no increase in work of breathing or accessory muscle use   6. Cardiovascular : Heart rate tachycardic, rhythm is regular, no murmurs, rubs or gallops, unilateral peripheral edema in the right lower extremity, peripheral pulses palpated   7. Gastrointestinal:  Abdomen is soft, mildly distended, nontender to palpation bowel sounds active, no masses or organomegaly palpated   8. Skin:  Skin is warm, dry and intact without rashes, acute lesions, or ulcers on limited exam   9.Musculoskeletal:  No acute deformities or trauma, no asymmetry in tone, peripheral pulses palpated, no tenderness to palpation in the extremities  Data Reviewed: In the ED Temp 97.7, heart rate 109-139, respiratory rate 14-20, blood pressure 158/95-180/105 No leukocytosis with a white blood cell count of 7.1, hemoglobin 15.0, platelets 102 Chemistry reveals a low bicarb at 17 likely related to alcohol acidosis AST elevated at 100, ALT elevated at 63, T. bili elevated at 1.8 which is actually down from previous 2.6 Chest x-ray is negative for acute findings Ultrasound right lower extremity is negative for DVT Admission requested for alcohol withdrawal  Assessment and Plan: * Alcohol withdrawal (HCC) -50mg  Librium in ED -Required a total of 7mg  Ativan in ED -Continue CIWA -Continue seizure precautions -Continue thiamine and folic acid supplementation -Baseline 12pack/day -Last drink 8:30pm 12/22/21 - was the 12th drink of the day  Unilateral edema of lower extremity -right lower extremity edema with venous stasis changes of the skin -12/24/21 R lower extremity negative for DVT -Low suspicion for infection -Continue to monitor  Transaminitis -AST up to 100 from previous -ALT up to 63 from previous -Likely related to EtOH abuse -Fluid bolus in ED -Hold hepatotoxic agents when possible -RUQ 13 -Trend in the AM  Tachycardia with  hypertension -HR 120s -BP 160s/100s at admission -Continue home dose of metoprolol -Continue to treat EtOH withdrawal -Continue to monitor       Advance Care Planning:   Code Status: Full Code   Consults: none  Family Communication: no family at bedside  Severity of Illness: The appropriate patient status for this patient is INPATIENT. Inpatient status is judged to be reasonable and necessary in order to provide the required intensity of service to ensure the patient's safety. The patient's presenting symptoms, physical exam findings, and initial radiographic and laboratory data in the context of their chronic comorbidities is felt to place them at high risk for further clinical deterioration. Furthermore, it is not anticipated that the patient will be medically stable for discharge from the hospital within 2 midnights of admission.   * I certify that at the point of admission it is my clinical judgment that the patient will require inpatient hospital care spanning beyond 2 midnights from the point of admission due to high intensity of service, high risk for further deterioration and high frequency of surveillance required.*  Author: Lilyan Gilford, DO 12/23/2021 8:21 PM  For on call review [email or web address].

## 2021-12-24 ENCOUNTER — Encounter: Payer: Self-pay | Admitting: Family Medicine

## 2021-12-24 DIAGNOSIS — I10 Essential (primary) hypertension: Secondary | ICD-10-CM | POA: Diagnosis not present

## 2021-12-24 DIAGNOSIS — F10931 Alcohol use, unspecified with withdrawal delirium: Secondary | ICD-10-CM

## 2021-12-24 DIAGNOSIS — R000 Tachycardia, unspecified: Secondary | ICD-10-CM | POA: Diagnosis not present

## 2021-12-24 DIAGNOSIS — R7401 Elevation of levels of liver transaminase levels: Secondary | ICD-10-CM | POA: Diagnosis not present

## 2021-12-24 LAB — CBC WITH DIFFERENTIAL/PLATELET
Abs Immature Granulocytes: 0.01 10*3/uL (ref 0.00–0.07)
Basophils Absolute: 0.1 10*3/uL (ref 0.0–0.1)
Basophils Relative: 1 %
Eosinophils Absolute: 0 10*3/uL (ref 0.0–0.5)
Eosinophils Relative: 0 %
HCT: 39.5 % (ref 39.0–52.0)
Hemoglobin: 13.2 g/dL (ref 13.0–17.0)
Immature Granulocytes: 0 %
Lymphocytes Relative: 19 %
Lymphs Abs: 1.1 10*3/uL (ref 0.7–4.0)
MCH: 34.3 pg — ABNORMAL HIGH (ref 26.0–34.0)
MCHC: 33.4 g/dL (ref 30.0–36.0)
MCV: 102.6 fL — ABNORMAL HIGH (ref 80.0–100.0)
Monocytes Absolute: 1.1 10*3/uL — ABNORMAL HIGH (ref 0.1–1.0)
Monocytes Relative: 19 %
Neutro Abs: 3.4 10*3/uL (ref 1.7–7.7)
Neutrophils Relative %: 61 %
Platelets: 81 10*3/uL — ABNORMAL LOW (ref 150–400)
RBC: 3.85 MIL/uL — ABNORMAL LOW (ref 4.22–5.81)
RDW: 15.7 % — ABNORMAL HIGH (ref 11.5–15.5)
WBC: 5.7 10*3/uL (ref 4.0–10.5)
nRBC: 0 % (ref 0.0–0.2)

## 2021-12-24 LAB — COMPREHENSIVE METABOLIC PANEL
ALT: 48 U/L — ABNORMAL HIGH (ref 0–44)
ALT: 45 U/L — ABNORMAL HIGH (ref 0–44)
AST: 73 U/L — ABNORMAL HIGH (ref 15–41)
AST: 63 U/L — ABNORMAL HIGH (ref 15–41)
Albumin: 4 g/dL (ref 3.5–5.0)
Albumin: 3.8 g/dL (ref 3.5–5.0)
Alkaline Phosphatase: 47 U/L (ref 38–126)
Alkaline Phosphatase: 45 U/L (ref 38–126)
Anion gap: 10 (ref 5–15)
Anion gap: 7 (ref 5–15)
BUN: 15 mg/dL (ref 6–20)
BUN: 14 mg/dL (ref 6–20)
CO2: 21 mmol/L — ABNORMAL LOW (ref 22–32)
CO2: 22 mmol/L (ref 22–32)
Calcium: 8.7 mg/dL — ABNORMAL LOW (ref 8.9–10.3)
Calcium: 8.6 mg/dL — ABNORMAL LOW (ref 8.9–10.3)
Chloride: 110 mmol/L (ref 98–111)
Chloride: 113 mmol/L — ABNORMAL HIGH (ref 98–111)
Creatinine, Ser: 0.71 mg/dL (ref 0.61–1.24)
Creatinine, Ser: 0.55 mg/dL — ABNORMAL LOW (ref 0.61–1.24)
GFR, Estimated: >60 mL/min (ref >60)
GFR, Estimated: >60 mL/min (ref >60)
Glucose, Bld: 122 mg/dL — ABNORMAL HIGH (ref 70–99)
Glucose, Bld: 128 mg/dL — ABNORMAL HIGH (ref 70–99)
Potassium: 3.6 mmol/L (ref 3.5–5.1)
Potassium: 3.3 mmol/L — ABNORMAL LOW (ref 3.5–5.1)
Sodium: 141 mmol/L (ref 135–145)
Sodium: 142 mmol/L (ref 135–145)
Total Bilirubin: 1.9 mg/dL — ABNORMAL HIGH (ref 0.3–1.2)
Total Bilirubin: 2.1 mg/dL — ABNORMAL HIGH (ref 0.3–1.2)
Total Protein: 7.9 g/dL (ref 6.5–8.1)
Total Protein: 7.2 g/dL (ref 6.5–8.1)

## 2021-12-24 LAB — MAGNESIUM: Magnesium: 1.9 mg/dL (ref 1.7–2.4)

## 2021-12-24 MED ORDER — CHLORDIAZEPOXIDE HCL 5 MG PO CAPS
25.0000 mg | ORAL_CAPSULE | ORAL | Status: AC
  Administered 2021-12-26 (×2): 25 mg via ORAL
  Filled 2021-12-24 (×2): qty 5

## 2021-12-24 MED ORDER — LORAZEPAM 2 MG/ML IJ SOLN
1.0000 mg | INTRAMUSCULAR | Status: DC | PRN
  Administered 2021-12-25 – 2021-12-27 (×11): 2 mg via INTRAVENOUS
  Filled 2021-12-24 (×11): qty 1

## 2021-12-24 MED ORDER — ORAL CARE MOUTH RINSE: 15.0000 mL | OROMUCOSAL | Status: DC | PRN

## 2021-12-24 MED ORDER — LORAZEPAM 2 MG/ML IJ SOLN
4.0000 mg | INTRAMUSCULAR | Status: AC
  Administered 2021-12-24: 4 mg via INTRAVENOUS

## 2021-12-24 MED ORDER — DEXMEDETOMIDINE HCL IN NACL 200 MCG/50ML IV SOLN: 0.4000 ug/kg/h | INTRAVENOUS | Status: DC

## 2021-12-24 MED ORDER — CHLORDIAZEPOXIDE HCL 25 MG PO CAPS
25.0000 mg | ORAL_CAPSULE | Freq: Four times a day (QID) | ORAL | Status: AC
  Administered 2021-12-24 (×3): 25 mg via ORAL
  Filled 2021-12-24 (×3): qty 1

## 2021-12-24 MED ORDER — POTASSIUM CHLORIDE CRYS ER 20 MEQ PO TBCR
20.0000 meq | EXTENDED_RELEASE_TABLET | Freq: Two times a day (BID) | ORAL | Status: AC
  Administered 2021-12-24 (×2): 20 meq via ORAL
  Filled 2021-12-24 (×2): qty 1

## 2021-12-24 MED ORDER — CHLORDIAZEPOXIDE HCL 5 MG PO CAPS
25.0000 mg | ORAL_CAPSULE | Freq: Every day | ORAL | Status: AC
  Administered 2021-12-27: 25 mg via ORAL
  Filled 2021-12-24: qty 5

## 2021-12-24 MED ORDER — DEXMEDETOMIDINE HCL IN NACL 200 MCG/50ML IV SOLN
0.4000 ug/kg/h | INTRAVENOUS | Status: DC
  Filled 2021-12-24: qty 50

## 2021-12-24 MED ORDER — DEXMEDETOMIDINE HCL IN NACL 400 MCG/100ML IV SOLN
0.4000 ug/kg/h | INTRAVENOUS | Status: DC
  Administered 2021-12-24: 0.5 ug/kg/h via INTRAVENOUS
  Administered 2021-12-25 (×2): 1.2 ug/kg/h via INTRAVENOUS
  Administered 2021-12-25 (×2): 0.6 ug/kg/h via INTRAVENOUS
  Administered 2021-12-25: 1.2 ug/kg/h via INTRAVENOUS
  Administered 2021-12-26: 0.6 ug/kg/h via INTRAVENOUS
  Filled 2021-12-24 (×8): qty 100

## 2021-12-24 MED ORDER — CHLORDIAZEPOXIDE HCL 5 MG PO CAPS: 25.0000 mg | ORAL_CAPSULE | Freq: Three times a day (TID) | ORAL | Status: AC

## 2021-12-24 NOTE — ED Notes (Signed)
Pt calm and cooperative and has no complaints. He is alert and oriented x4. Denies any pain other than cramping in his right leg. CIWA 0. Denies chest pain and shortness of breath. Resp. Rate even and nonlabored. Green top drawn and sent to lab. Seizure pads on bed, pt on cardiac monitor. Call light within reach. Pt denies needing anything at this time.

## 2021-12-24 NOTE — ED Notes (Signed)
Advised nurse that patient has ready bed 

## 2021-12-24 NOTE — Progress Notes (Signed)
Patient transported to ICU 13.  Report given.

## 2021-12-24 NOTE — Progress Notes (Signed)
PROGRESS NOTE    Lawrence Rowe  JEH:[id number] DOB: 1970/05/14 DOA: 12/23/2021 PCP: Baldwin Jamaica, MD    Brief Narrative:  51 year old gentleman with no significant history but history of alcohol abuse and previous history of alcohol withdrawal delirium tremens presented to the emergency room with foot cramping, tremulousness, anxiety and palpitations that he woke up in the morning after drinking about 12 drinks the night before.  In the emergency room hemodynamically stable, obviously tremulous and withdrawing.  Admitted for alcohol withdrawal syndrome.   Assessment & Plan:   Alcoholism with alcohol withdrawal syndrome: Currently no evidence of delirium tremens. Agree with monitoring due to severity of symptoms. Start Librium taper Continue CIWA protocol with Ativan Continue multivitamins and thiamine Seizure precautions and delirium precautions.  Fall precautions. Social worker to provide outpatient resources.  Essential hypertension, blood pressure stable.  On metoprolol.  Hypokalemia: Replace.  Check magnesium and phosphorus.   DVT prophylaxis: heparin injection 5,000 Units Start: 12/23/21 2200 SCDs Start: 12/23/21 2032   Code Status: Full code Family Communication: None at the bedside Disposition Plan: Status is: Inpatient Remains inpatient appropriate because: Significant alcohol withdrawal syndrome     Consultants:  None  Procedures:  None  Antimicrobials:  None   Subjective: Patient seen and examined morning rounds.  Still in the emergency room.  Denies any hallucinations or delusions.  Looks tremulous.  Forgetful.  Objective: Vitals:   12/24/21 0630 12/24/21 0700 12/24/21 0718 12/24/21 0920  BP: (!) 176/95 (!) 159/87 (!) 159/87 (!) 165/92  Pulse: 76 71 74 80  Resp: 19 17 16 13   Temp:    98 F (36.7 C)  TempSrc:    Oral  SpO2: 96% 93% 95% 96%  Weight:      Height:       No intake or output data in the 24 hours ending 12/24/21  1223 Filed Weights   12/23/21 1611  Weight: 90.7 kg    Examination:  General exam: Appears shaky and tremulous. Alert awake oriented x 4.  Without any hallucinations or delusions. Respiratory system: Clear to auscultation. Respiratory effort normal. Cardiovascular system: S1 & S2 heard, RRR. No JVD, murmurs, rubs, gallops or clicks.  Shiny extremities. Gastrointestinal system: Abdomen is nondistended, soft and nontender. No organomegaly or masses felt. Normal bowel sounds heard. Central nervous system: Alert and oriented. No focal neurological deficits. Extremities: Symmetric 5 x 5 power.     Data Reviewed: I have personally reviewed following labs and imaging studies  CBC: Recent Labs  Lab 12/23/21 1624 12/24/21 0315  WBC 7.1 5.7  NEUTROABS 5.4 3.4  HGB 15.0 13.2  HCT 43.5 39.5  MCV 100.0 102.6*  PLT 102* 81*   Basic Metabolic Panel: Recent Labs  Lab 12/23/21 1624 12/24/21 0315 12/24/21 0700  NA 141 141 142  K 3.9 3.6 3.3*  CL 108 110 113*  CO2 17* 21* 22  GLUCOSE 113* 122* 128*  BUN 12 15 14   CREATININE 0.60* 0.71 0.55*  CALCIUM 9.8 8.7* 8.6*  MG 1.9 1.9  --    GFR: Estimated Creatinine Clearance: 117.2 mL/min (A) (by C-G formula based on SCr of 0.55 mg/dL (L)). Liver Function Tests: Recent Labs  Lab 12/23/21 1624 12/24/21 0315 12/24/21 0700  AST 100* 73* 63*  ALT 63* 48* 45*  ALKPHOS 60 47 45  BILITOT 1.8* 1.9* 2.1*  PROT 9.5* 7.9 7.2  ALBUMIN 5.0 4.0 3.8   No results for input(s): "LIPASE", "AMYLASE" in the last 168 hours. No results for input(s): "  AMMONIA" in the last 168 hours. Coagulation Profile: No results for input(s): "INR", "PROTIME" in the last 168 hours. Cardiac Enzymes: No results for input(s): "CKTOTAL", "CKMB", "CKMBINDEX", "TROPONINI" in the last 168 hours. BNP (last 3 results) No results for input(s): "PROBNP" in the last 8760 hours. HbA1C: No results for input(s): "HGBA1C" in the last 72 hours. CBG: No results for  input(s): "GLUCAP" in the last 168 hours. Lipid Profile: No results for input(s): "CHOL", "HDL", "LDLCALC", "TRIG", "CHOLHDL", "LDLDIRECT" in the last 72 hours. Thyroid Function Tests: Recent Labs    12/23/21 1624  TSH 1.580   Anemia Panel: No results for input(s): "VITAMINB12", "FOLATE", "FERRITIN", "TIBC", "IRON", "RETICCTPCT" in the last 72 hours. Sepsis Labs: No results for input(s): "PROCALCITON", "LATICACIDVEN" in the last 168 hours.  No results found for this or any previous visit (from the past 240 hour(s)).       Radiology Studies: US Venous Img Lower Unilateral Right  Result Date: 12/23/2021 CLINICAL DATA:  Leg swelling and discoloration EXAM: RIGHT LOWER EXTREMITY VENOUS DOPPLER ULTRASOUND TECHNIQUE: Gray-scale sonography with compression, as well as color and duplex ultrasound, were performed to evaluate the deep venous system(s) from the level of the common femoral vein through the popliteal and proximal calf veins. COMPARISON:  09/15/2021 FINDINGS: VENOUS Normal compressibility of the common femoral, superficial femoral, and popliteal veins, as well as the visualized calf veins. Visualized portions of profunda femoral vein and great saphenous vein unremarkable. No filling defects to suggest DVT on grayscale or color Doppler imaging. Doppler waveforms show normal direction of venous flow, normal respiratory plasticity and response to augmentation. Limited views of the contralateral common femoral vein are unremarkable. OTHER None. Limitations: none IMPRESSION: Negative. Electronically Signed   By: Charline Bills M.D.   On: 12/23/2021 20:08   DG Chest Portable 1 View  Result Date: 12/23/2021 CLINICAL DATA:  Tachycardia, ETOH withdrawal EXAM: PORTABLE CHEST 1 VIEW COMPARISON:  None Available. FINDINGS: Lungs are clear.  No pleural effusion or pneumothorax. The heart is normal in size. IMPRESSION: No evidence of acute cardiopulmonary disease. Electronically Signed   By:  Charline Bills M.D.   On: 12/23/2021 19:37        Scheduled Meds:  chlordiazePOXIDE  25 mg Oral QID   Followed by   Melene Muller ON 12/25/2021] chlordiazePOXIDE  25 mg Oral TID   Followed by   Melene Muller ON 12/26/2021] chlordiazePOXIDE  25 mg Oral BH-qamhs   Followed by   Melene Muller ON 12/27/2021] chlordiazePOXIDE  25 mg Oral Daily   folic acid  1 mg Oral Daily   heparin  5,000 Units Subcutaneous Q8H   LORazepam  0-4 mg Intravenous Q6H   Followed by   Melene Muller ON 12/25/2021] LORazepam  0-4 mg Intravenous Q12H   metoprolol succinate  50 mg Oral Daily   multivitamin with minerals  1 tablet Oral Daily   potassium chloride  20 mEq Oral BID   thiamine  100 mg Oral Daily   Or   thiamine  100 mg Intravenous Daily   Continuous Infusions:  sodium chloride 1,000 mL (12/24/21 0922)     LOS: 1 day    Time spent: 35 minutes    Dorcas Carrow, MD [facility] Pager [phone]

## 2021-12-24 NOTE — Progress Notes (Addendum)
Report received from Kristine Royal RN at 11:25 P.M. 11:30 P.M. patient was transferred to ICU escorted by two security guards, two nurse techs, and two nurses. Patient extremely agitated, combative, and verbally aggressive. Mask placed on the patient due to spitting. Patient was being manually held/restrained by security and nurses. Patient was transferred to ICU bed, and 4-point restraints were applied (as instructed by Webb Silversmith NP). CIWA 43 at this time. Precedex gtt ordered and infusion started. Instructed not to give PRN Ativan at this time until patient's response to Precedex is observed. HR 132, spO2 98% on RA, RR 36 and unable to obtain accurate BP d/t patient tremors and restlessness. BP 123/90 at 11:40 P.M. See flowsheets for additional assessment and vital signs.

## 2021-12-24 NOTE — Progress Notes (Incomplete)
Report received from Kristine Royal RN at 11:25 P.M. 11:30 P.M. patient was transferred to ICU escorted by two security guards, two nurse techs, and two nurses. Patient extremely agitated, combative, and verbally aggressive. Mask placed on the patient due to spitting. Patient was being manually held/restrained by security. Patient was transferred to ICU bed, and 4-point restraints were applied. Precedex ordered and infusion started.

## 2021-12-24 NOTE — Progress Notes (Signed)
       CROSS COVER NOTE  NAME: ZAKKERY DORIAN MRN: [id number] DOB : [birth year]   HPI/Events of Note   Patient became combative hallucinating requiring security to be called for safety despite ativan given per CIWA.   Assessment and  Interventions   Assessment: Rescue dose 4 mg lorazepam IV ordered and given without symptom improvement Plan: Transfer to stepdown Precedex infusion ordered Consult CCM - message sen the E [facility] NP and acknowledged consult need       Donnie Mesa NP  [facility]

## 2021-12-24 NOTE — ED Notes (Signed)
Pt helped up to the bathroom. He voided and had a bowel movement. Says he is feeling less anxious from the ativan. Vitals stable. Does not need anything at this time.

## 2021-12-24 NOTE — ED Notes (Signed)
Dr. Jerral Ralph notified of pts potassium level of 3.3

## 2021-12-24 NOTE — Progress Notes (Signed)
Patient hallucinating/fighting/kicking and cussing at staff.  CIWA 26.  Stat order for ativan 4mg  given.  Security at bedside assisting with patient.  NP notified.  Order to move patient to higher level of care

## 2021-12-24 NOTE — ED Notes (Signed)
Reort given to, [employer]

## 2021-12-25 DIAGNOSIS — I10 Essential (primary) hypertension: Secondary | ICD-10-CM | POA: Diagnosis not present

## 2021-12-25 DIAGNOSIS — R000 Tachycardia, unspecified: Secondary | ICD-10-CM | POA: Diagnosis not present

## 2021-12-25 DIAGNOSIS — F10939 Alcohol use, unspecified with withdrawal, unspecified: Secondary | ICD-10-CM | POA: Diagnosis not present

## 2021-12-25 LAB — CBC WITH DIFFERENTIAL/PLATELET
Abs Immature Granulocytes: 0.02 10*3/uL (ref 0.00–0.07)
Basophils Absolute: 0.1 10*3/uL (ref 0.0–0.1)
Basophils Relative: 1 %
Eosinophils Absolute: 0.1 10*3/uL (ref 0.0–0.5)
Eosinophils Relative: 1 %
HCT: 41 % (ref 39.0–52.0)
Hemoglobin: 13.8 g/dL (ref 13.0–17.0)
Immature Granulocytes: 0 %
Lymphocytes Relative: 18 %
Lymphs Abs: 1.1 10*3/uL (ref 0.7–4.0)
MCH: 34.9 pg — ABNORMAL HIGH (ref 26.0–34.0)
MCHC: 33.7 g/dL (ref 30.0–36.0)
MCV: 103.8 fL — ABNORMAL HIGH (ref 80.0–100.0)
Monocytes Absolute: 0.9 10*3/uL (ref 0.1–1.0)
Monocytes Relative: 16 %
Neutro Abs: 3.7 10*3/uL (ref 1.7–7.7)
Neutrophils Relative %: 64 %
Platelets: 97 10*3/uL — ABNORMAL LOW (ref 150–400)
RBC: 3.95 MIL/uL — ABNORMAL LOW (ref 4.22–5.81)
RDW: 14.9 % (ref 11.5–15.5)
WBC: 5.8 10*3/uL (ref 4.0–10.5)
nRBC: 0 % (ref 0.0–0.2)

## 2021-12-25 LAB — HEPATIC FUNCTION PANEL
ALT: 44 U/L (ref 0–44)
AST: 67 U/L — ABNORMAL HIGH (ref 15–41)
Albumin: 4.1 g/dL (ref 3.5–5.0)
Alkaline Phosphatase: 53 U/L (ref 38–126)
Bilirubin, Direct: 0.4 mg/dL — ABNORMAL HIGH (ref 0.0–0.2)
Indirect Bilirubin: 2.3 mg/dL — ABNORMAL HIGH (ref 0.3–0.9)
Total Bilirubin: 2.7 mg/dL — ABNORMAL HIGH (ref 0.3–1.2)
Total Protein: 7.7 g/dL (ref 6.5–8.1)

## 2021-12-25 LAB — COMPREHENSIVE METABOLIC PANEL
ALT: 44 U/L (ref 0–44)
AST: 68 U/L — ABNORMAL HIGH (ref 15–41)
Albumin: 4 g/dL (ref 3.5–5.0)
Alkaline Phosphatase: 45 U/L (ref 38–126)
Anion gap: 13 (ref 5–15)
BUN: 14 mg/dL (ref 6–20)
CO2: 20 mmol/L — ABNORMAL LOW (ref 22–32)
Calcium: 9 mg/dL (ref 8.9–10.3)
Chloride: 107 mmol/L (ref 98–111)
Creatinine, Ser: 0.77 mg/dL (ref 0.61–1.24)
GFR, Estimated: >60 mL/min (ref >60)
Glucose, Bld: 114 mg/dL — ABNORMAL HIGH (ref 70–99)
Potassium: 3.5 mmol/L (ref 3.5–5.1)
Sodium: 140 mmol/L (ref 135–145)
Total Bilirubin: 2.5 mg/dL — ABNORMAL HIGH (ref 0.3–1.2)
Total Protein: 7.7 g/dL (ref 6.5–8.1)

## 2021-12-25 LAB — MAGNESIUM: Magnesium: 1.8 mg/dL (ref 1.7–2.4)

## 2021-12-25 LAB — BLOOD GAS, VENOUS
Acid-Base Excess: 0.1 mmol/L (ref 0.0–2.0)
Bicarbonate: 23.8 mmol/L (ref 20.0–28.0)
O2 Saturation: 95 %
Patient temperature: 37
pCO2, Ven: 35 mmHg — ABNORMAL LOW (ref 44–60)
pH, Ven: 7.44 — ABNORMAL HIGH (ref 7.25–7.43)
pO2, Ven: 76 mmHg — ABNORMAL HIGH (ref 32–45)

## 2021-12-25 LAB — GLUCOSE, CAPILLARY
Glucose-Capillary: 111 mg/dL — ABNORMAL HIGH (ref 70–99)
Glucose-Capillary: 119 mg/dL — ABNORMAL HIGH (ref 70–99)
Glucose-Capillary: 96 mg/dL (ref 70–99)

## 2021-12-25 LAB — PHOSPHORUS: Phosphorus: 3 mg/dL (ref 2.5–4.6)

## 2021-12-25 LAB — LACTIC ACID, PLASMA: Lactic Acid, Venous: 2.1 mmol/L (ref 0.5–1.9)

## 2021-12-25 MED ORDER — HYDRALAZINE HCL 20 MG/ML IJ SOLN
INTRAMUSCULAR | Status: AC
  Filled 2021-12-25: qty 1

## 2021-12-25 MED ORDER — LACTATED RINGERS IV SOLN: INTRAVENOUS | Status: DC

## 2021-12-25 MED ORDER — FOLIC ACID 5 MG/ML IJ SOLN
1.0000 mg | Freq: Every day | INTRAMUSCULAR | Status: DC
  Administered 2021-12-25 – 2021-12-26 (×2): 1 mg via INTRAVENOUS
  Filled 2021-12-25 (×3): qty 0.2

## 2021-12-25 MED ORDER — MAGNESIUM SULFATE 2 GM/50ML IV SOLN
2.0000 g | Freq: Once | INTRAVENOUS | Status: AC
  Administered 2021-12-25: 2 g via INTRAVENOUS
  Filled 2021-12-25: qty 50

## 2021-12-25 MED ORDER — LACTATED RINGERS IV BOLUS
500.0000 mL | Freq: Once | INTRAVENOUS | Status: AC
  Administered 2021-12-25: 500 mL via INTRAVENOUS

## 2021-12-25 MED ORDER — PHENOBARBITAL SODIUM 130 MG/ML IJ SOLN
65.0000 mg | Freq: Once | INTRAMUSCULAR | Status: AC
  Administered 2021-12-25: 65 mg via INTRAVENOUS
  Filled 2021-12-25: qty 1

## 2021-12-25 MED ORDER — POTASSIUM CHLORIDE 10 MEQ/100ML IV SOLN
10.0000 meq | INTRAVENOUS | Status: AC
  Administered 2021-12-25 (×3): 10 meq via INTRAVENOUS
  Filled 2021-12-25 (×3): qty 100

## 2021-12-25 MED ORDER — HYDRALAZINE HCL 20 MG/ML IJ SOLN
10.0000 mg | Freq: Four times a day (QID) | INTRAMUSCULAR | Status: DC | PRN
  Administered 2021-12-25: 10 mg via INTRAVENOUS

## 2021-12-25 MED ORDER — CHLORHEXIDINE GLUCONATE CLOTH 2 % EX PADS
6.0000 | MEDICATED_PAD | Freq: Every day | CUTANEOUS | Status: DC
  Administered 2021-12-26 – 2021-12-30 (×6): 6 via TOPICAL

## 2021-12-25 MED ORDER — HYDRALAZINE HCL 20 MG/ML IJ SOLN
20.0000 mg | Freq: Three times a day (TID) | INTRAMUSCULAR | Status: DC | PRN
  Administered 2021-12-26 – 2021-12-27 (×3): 20 mg via INTRAVENOUS
  Filled 2021-12-25 (×3): qty 1

## 2021-12-25 NOTE — Care Management (Signed)
Patient transferred to intensive care unit on high-dose Precedex.  Anticipate to remain in ICU for next 24 to 48 hours.  Discussed with critical care team, will transfer care to ICU services.  TRH will sign off.  Will reengage when patient is ready to come out of ICU.

## 2021-12-25 NOTE — Progress Notes (Signed)
eLink Physician-Brief Progress Note Patient Name: Lawrence Rowe DOB: 1970-08-14 MRN: [id number]   Date of Service  12/25/2021  HPI/Events of Note  49 M ETOH abuse presented with foot cramping, anxiety and palpitations. Admitted to drinking 12 beers the night before. Transferred to ICU due to agitation and requiring 4 point restraints and Precedex drip.  eICU Interventions  Delirium tremens on scheduled benzos as well as Precedex drip Thiamine, folate , MVI Correcting electrolytes     Intervention Category Evaluation Type: New Patient Evaluation  Darl Pikes 12/25/2021, 12:09 AM

## 2021-12-25 NOTE — Plan of Care (Signed)
Continuing with plan of care. 

## 2021-12-25 NOTE — Consult Note (Addendum)
NAME:  Lawrence Rowe, MRN:  [id number], DOB:  1970-09-27, LOS: 2 ADMISSION DATE:  12/23/2021, CONSULTATION DATE: 12/25/2021 REFERRING MD:  Dorcas Carrow , REASON FOR CONSULT: Alcohol withdrawal  HPI  51 y.o with significant PMH of EtOH abuse and hypertension who presented to the ED with chief complaints of alcohol withdrawal symptoms.  Per ED reports, patient self-reports a 12 pack/day beer drinker with the last drink on 12/22/21.  Denies history of DTs or seizures   ED Course: Initial vital signs showed HR of 139 beats/minute, BP 162/95 mm Hg, the RR 20 breaths/minute, and the oxygen saturation 96 % on  RA and a temperature of 97.12F (36.5C). Abnormal Labs/Diagnostics Findings: CO2:17, Glucose: 113 , Creatinine:  0.60, Total Protein: 9.5  AST/AST:100/63, Total Bilirubin:1.8, Anion gap:16, Platelets:102.  CXR> No evidence of acute cardiopulmonary disease.  Patient persistently tachycardic hypertensive and tremulous after multiple doses of IV and p.o. benzos requiring admission to the hospital for alcohol withdrawal.  Hospitalist consulted admission.  Hospital Course: Patient admitted to progressive care unit started on Librium taper and placed on CIWA protocol.  On 12/16, patient became increasingly agitated, combative and hallucinating despite multiple doses of Ativan per CIWA protocol requiring security to be called.  He was transferred to the ICU and started on Precedex drip.  PCCM consulted  Past Medical History  EtOH abuse Former smoker Hypertension Chronic right lower extremity edema with venous stasis  Significant Hospital Events   12/15: Admitted to progressive care unit for alcohol withdrawal drip 12/16: Transferred to the ICU with worsening withdrawal symptoms and alcoholic hallucinosis.  Started on Precedex gtt.  PCCM consulted  Consults:  PCCM  Procedures:  None  Significant Diagnostic Tests:  12/15: Chest Xray>No evidence of acute cardiopulmonary disease.      Micro Data:  12/15: SARS-CoV-2 PCR> negative 12/15: Influenza PCR> negative  Antimicrobials:  None  OBJECTIVE  Blood pressure (!) 123/90, pulse 82, temperature 98.6 F (37 C), resp. rate 12, height 6' (1.829 m), weight 90.7 kg, SpO2 91 %.        Intake/Output Summary (Last 24 hours) at 12/25/2021 0009 Last data filed at 12/24/2021 1841 Gross per 24 hour  Intake 1661.87 ml  Output --  Net 1661.87 ml   Filed Weights   12/23/21 1611  Weight: 90.7 kg   Physical Examination  GENERAL: 51 year-old critically ill patient lying in the bed agitated, combative and verbally aggressive EYES: Pupils equal, round, reactive to light and accommodation. No scleral icterus. Extraocular muscles intact.  HEENT: Head atraumatic, normocephalic. Oropharynx and nasopharynx clear.  NECK:  Supple, no jugular venous distention. No thyroid enlargement, no tenderness.  LUNGS: Normal breath sounds bilaterally, no wheezing, rales,rhonchi or crepitation. No use of accessory muscles of respiration.  CARDIOVASCULAR: S1, S2 normal. No murmurs, rubs, or gallops.  ABDOMEN: Soft, nontender, nondistended. Bowel sounds present. No organomegaly or mass.  EXTREMITIES: Upper and lower extremities are atraumatic in appearance without tenderness or deformity. No swelling or erythema. Bilateral lower extremity discoloration.  Capillary refill is less than 3 seconds in all extremities. Pulses palpable.  NEUROLOGIC:The patient is awake, disoriented  and agitated. Unable to accurately assess motor function or muscle strength Sensation is intact bilaterally. Cranial nerves are intact. Gait not checked.  PSYCHIATRIC The patient is agitated and aggressive SKIN:` No skin ulcers, bruising.  Bilateral lower extremity discoloration  Labs/imaging that I havepersonally reviewed  (right click and "Reselect all SmartList Selections" daily)     Labs   CBC:  Recent Labs  Lab 12/23/21 1624 12/24/21 0315  WBC 7.1 5.7  NEUTROABS  5.4 3.4  HGB 15.0 13.2  HCT 43.5 39.5  MCV 100.0 102.6*  PLT 102* 81*    Basic Metabolic Panel: Recent Labs  Lab 12/23/21 1624 12/24/21 0315 12/24/21 0700  NA 141 141 142  K 3.9 3.6 3.3*  CL 108 110 113*  CO2 17* 21* 22  GLUCOSE 113* 122* 128*  BUN 12 15 14   CREATININE 0.60* 0.71 0.55*  CALCIUM 9.8 8.7* 8.6*  MG 1.9 1.9  --    GFR: Estimated Creatinine Clearance: 117.2 mL/min (A) (by C-G formula based on SCr of 0.55 mg/dL (L)). Recent Labs  Lab 12/23/21 1624 12/24/21 0315  WBC 7.1 5.7    Liver Function Tests: Recent Labs  Lab 12/23/21 1624 12/24/21 0315 12/24/21 0700  AST 100* 73* 63*  ALT 63* 48* 45*  ALKPHOS 60 47 45  BILITOT 1.8* 1.9* 2.1*  PROT 9.5* 7.9 7.2  ALBUMIN 5.0 4.0 3.8   No results for input(s): "LIPASE", "AMYLASE" in the last 168 hours. No results for input(s): "AMMONIA" in the last 168 hours.  ABG No results found for: "PHART", "PCO2ART", "PO2ART", "HCO3", "TCO2", "ACIDBASEDEF", "O2SAT"   Coagulation Profile: No results for input(s): "INR", "PROTIME" in the last 168 hours.  Cardiac Enzymes: No results for input(s): "CKTOTAL", "CKMB", "CKMBINDEX", "TROPONINI" in the last 168 hours.  HbA1C: No results found for: "HGBA1C"  CBG: No results for input(s): "GLUCAP" in the last 168 hours.  Review of Systems:   UNABLE TO OBTAIN PATIENT IS ALTERED.  Past Medical History  He,  has a past medical history of Alcohol abuse.   Surgical History    Past Surgical History:  Procedure Laterality Date   ELBOW SURGERY Left      Social History   reports that he quit smoking about 3 years ago. His smoking use included cigarettes. He has never used smokeless tobacco. He reports current alcohol use of about 12.0 standard drinks of alcohol per week. He reports that he does not use drugs.   Family History   His family history is not on file.   Allergies Allergies  Allergen Reactions   Penicillins Other (See Comments)     Home Medications   Prior to Admission medications   Medication Sig Start Date End Date Taking? Authorizing Provider  metoprolol succinate (TOPROL-XL) 50 MG 24 hr tablet Take 1 tablet (50 mg total) by mouth daily. Take with or immediately following a meal. 09/25/21  Yes 09/27/21, MD  ALPRAZolam Sharman Cheek) 1 MG tablet Take 1 mg by mouth at bedtime as needed for anxiety. Take 1 and half tablets daily prn Patient not taking: Reported on 12/23/2021    [provider]  cephALEXin (KEFLEX) 500 MG capsule Take 1 capsule (500 mg total) by mouth 3 (three) times daily. Patient not taking: Reported on 12/23/2021 10/06/21   10/08/21, MD  mupirocin ointment Deirdre Evener) 2 % Apply to skin qd-bid Patient not taking: Reported on 12/23/2021 10/06/21   10/08/21, MD  sulfamethoxazole-trimethoprim (BACTRIM DS) 800-160 MG tablet Take 1 tablet by mouth 2 (two) times daily. Patient not taking: Reported on 12/23/2021 09/25/21   09/27/21, MD  sulfamethoxazole-trimethoprim (BACTRIM DS) 800-160 MG tablet Take 1 tablet x 7 days Patient not taking: Reported on 12/23/2021 10/06/21   10/08/21, MD    Scheduled Meds:  chlordiazePOXIDE  25 mg Oral TID   Followed by   Deirdre Evener ON  12/26/2021] chlordiazePOXIDE  25 mg Oral BH-qamhs   Followed by   Melene Muller ON 12/27/2021] chlordiazePOXIDE  25 mg Oral Daily   folic acid  1 mg Intravenous Daily   heparin  5,000 Units Subcutaneous Q8H   metoprolol succinate  50 mg Oral Daily   multivitamin with minerals  1 tablet Oral Daily   potassium chloride  20 mEq Oral BID   thiamine  100 mg Oral Daily   Or   thiamine  100 mg Intravenous Daily   Continuous Infusions:  sodium chloride 100 mL/hr at 12/24/21 1954   dexmedetomidine 1.2 mcg/kg/hr (12/25/21 0017)   lactated ringers     magnesium sulfate bolus IVPB     PRN Meds:.acetaminophen **OR** acetaminophen, LORazepam, morphine injection, ondansetron **OR** ondansetron (ZOFRAN) IV, mouth rinse,  oxyCODONE   Active Hospital Problem list   Severe alcohol withdrawal Acute metabolic encephalopathy Elevated LFTs Chronic thrombocytopenia Hypertension  Assessment & Plan:  EtOH Withdrawal and alcoholic hallucinosis PMHx: ETOH Abuse Average Drinks: 12 pack beer/day Last Drink: 12/14 ? Hx Seizures / DTs -High risk for Intubation -EtOH LEVELS <10, Utox pending -Check CMP, INR, Daily BMP+Mg -Daily Thiamine, Folate, MVI  -Start Precedex gtt. -Lorazepam 2mg  IV PRN seizures -Seizure precautions -SW consult for cessation resources -PT/OT evaluation for mobility   Acute Metabolic Encephalopathy in the setting of above -Provide supportive care -Promote normal sleep/wake cycle -Avoid sedating meds as able  Hypokalemia- Improving -Monitor I&O's / urinary output -Follow BMP -Replace electrolytes as indicated   Acute on Chronic Thrombocytopenia- Likely alcohol induced Plts.102  -Continue to monitor s/s of bleeding -Follow CBC   Hypertension Hypertensive on admission likely due to withdrawal -On metoprolol, will hold while on precedex to avoid bradycardia    Best practice:  Diet:  NPO Pain/Anxiety/Delirium protocol (if indicated): Yes (RASS goal -1) VAP protocol (if indicated): Not indicated DVT prophylaxis: LMWH GI prophylaxis: PPI Glucose control:  SSI No Central venous access:  N/A Arterial line:  N/A Foley:  N/A Mobility:  bed rest  PT consulted: N/A Last date of multidisciplinary goals of care discussion [12/17] Code Status:  full code Disposition: ICU   = Goals of Care = Code Status Order: FULL  Primary Emergency Contact: Nickson,Sandra Wishes to pursue full aggressive treatment and intervention options, including CPR and intubation, but goals of care will be addressed on going with family if that should become necessary.   Critical care time: 45 minutes       , DNP, CCRN, FNP-C, AGACNP-BC Acute Care Nurse Practitioner [facility]  PCCM on call pager [phone] until 7 am

## 2021-12-25 NOTE — Progress Notes (Signed)
Patient has had an eventful day. When patient was in need of prn ativan he was disoriented, agitated, combative, hallucinating, tremors, and verbally abusive along with unsafe behaviors.  Patient was on 1.2 of precidex throughout the shift and required one dose of phenobarbital at the beginning of the shift and a second dose was requested towards the ends of the shift due to increasing withdrawal behaviors. However, patient voided and was able to fall back to sleep.  Due to patients requirement of a combination of precidex, ativan, and phenobarbital he was sedated and unsafe to take po medications and Dr. Dorene Grebe was notified as one of the po medications is librium and the other antihypertensives.  Dr. Dorene Grebe placed order for hydralazine IV as patient's B/P was elevated to 177/98 at 1130.  After admnistration of hydralazine patient's B/P initially went up to 185/109 and then began to trend down to 161/91 by 1230 and down to the 130s to 150s systolic later in the shift all of which Dr. Dorene Grebe is aware of.  Family in to visit patient and supportive of patient and staff and requesting to see social work for plans in the future, family also understanding to Dr. Dorene Grebe advising for the patient to not be stimulated and allowed to rest.  Social work notified of request by family. Will endorse to oncoming shift.

## 2021-12-26 DIAGNOSIS — I10 Essential (primary) hypertension: Secondary | ICD-10-CM | POA: Diagnosis not present

## 2021-12-26 DIAGNOSIS — F10939 Alcohol use, unspecified with withdrawal, unspecified: Secondary | ICD-10-CM | POA: Diagnosis not present

## 2021-12-26 DIAGNOSIS — R7401 Elevation of levels of liver transaminase levels: Secondary | ICD-10-CM | POA: Diagnosis not present

## 2021-12-26 DIAGNOSIS — R000 Tachycardia, unspecified: Secondary | ICD-10-CM | POA: Diagnosis not present

## 2021-12-26 LAB — PHOSPHORUS: Phosphorus: 4 mg/dL (ref 2.5–4.6)

## 2021-12-26 LAB — POTASSIUM: Potassium: 3.4 mmol/L — ABNORMAL LOW (ref 3.5–5.1)

## 2021-12-26 LAB — GLUCOSE, CAPILLARY
Glucose-Capillary: 107 mg/dL — ABNORMAL HIGH (ref 70–99)
Glucose-Capillary: 108 mg/dL — ABNORMAL HIGH (ref 70–99)
Glucose-Capillary: 112 mg/dL — ABNORMAL HIGH (ref 70–99)
Glucose-Capillary: 94 mg/dL (ref 70–99)
Glucose-Capillary: 95 mg/dL (ref 70–99)

## 2021-12-26 LAB — MAGNESIUM: Magnesium: 1.9 mg/dL (ref 1.7–2.4)

## 2021-12-26 MED ORDER — POTASSIUM CHLORIDE CRYS ER 20 MEQ PO TBCR
40.0000 meq | EXTENDED_RELEASE_TABLET | Freq: Once | ORAL | Status: AC
  Administered 2021-12-26: 40 meq via ORAL
  Filled 2021-12-26: qty 2

## 2021-12-26 MED ORDER — ENOXAPARIN SODIUM 40 MG/0.4ML IJ SOSY
40.0000 mg | PREFILLED_SYRINGE | INTRAMUSCULAR | Status: DC
  Administered 2021-12-26 – 2021-12-30 (×5): 40 mg via SUBCUTANEOUS
  Filled 2021-12-26 (×5): qty 0.4

## 2021-12-26 MED ORDER — FOLIC ACID 1 MG PO TABS
1.0000 mg | ORAL_TABLET | Freq: Every day | ORAL | Status: DC
  Administered 2021-12-27 – 2021-12-31 (×5): 1 mg via ORAL
  Filled 2021-12-26 (×5): qty 1

## 2021-12-26 NOTE — Evaluation (Signed)
Occupational Therapy Evaluation Patient Details Name: Lawrence Rowe MRN: [id number] DOB: February 11, 1970 Today's Date: 12/26/2021   History of Present Illness 51 y.o with significant PMH of EtOH abuse and hypertension who presented to the ED with chief complaints of alcohol withdrawal symptoms.   Clinical Impression   Mr Lawrence Rowe was seen for OT evaluation this date. Prior to hospital admission, pt was IND for all ADLs and mobility. Pt lives alone in 2 level home, friends available 24/7 if needed. Pt presents to acute OT demonstrating impaired ADL performance and functional mobility 2/2 decreased activity tolerance, decreased coordination, and functional balance deficits. Pt currently requires MIN A exit bed, good sitting balance. SUPERIVSION + SETUP don B socks and grooming seated EOB, increased time 2/2 BUE tremors. MIN A + HHA sit<>stand and steps along bed. Pt would benefit from skilled OT to address noted impairments and functional limitations (see below for any additional details). Upon hospital discharge, recommend HHOT to maximize pt safety and return to PLOF.   Recommendations for follow up therapy are one component of a multi-disciplinary discharge planning process, led by the attending physician.  Recommendations may be updated based on patient status, additional functional criteria and insurance authorization.   Follow Up Recommendations  Home health OT     Assistance Recommended at Discharge Intermittent Supervision/Assistance  Patient can return home with the following A little help with walking and/or transfers;A little help with bathing/dressing/bathroom;Help with stairs or ramp for entrance    Functional Status Assessment  Patient has had a recent decline in their functional status and demonstrates the ability to make significant improvements in function in a reasonable and predictable amount of time.  Equipment Recommendations  BSC/3in1    Recommendations for Other  Services       Precautions / Restrictions Precautions Precautions: Fall Restrictions Weight Bearing Restrictions: No      Mobility Bed Mobility Overal bed mobility: Needs Assistance Bed Mobility: Supine to Sit, Sit to Supine     Supine to sit: Min assist Sit to supine: Min guard        Transfers Overall transfer level: Needs assistance Equipment used: 1 person hand held assist Transfers: Sit to/from Stand Sit to Stand: Min assist                  Balance Overall balance assessment: Needs assistance Sitting-balance support: No upper extremity supported, Feet supported Sitting balance-Leahy Scale: Fair     Standing balance support: Single extremity supported, During functional activity Standing balance-Leahy Scale: Poor                             ADL either performed or assessed with clinical judgement   ADL Overall ADL's : Needs assistance/impaired                                       General ADL Comments: SUPERIVSION don B socks seated EOB, increased time 2/2 BUE tremors. MIN A + HHA for simulated BSC t/f. SETUP tooth brushing seated EOB.      Pertinent Vitals/Pain Pain Assessment Pain Assessment: No/denies pain     Hand Dominance     Extremity/Trunk Assessment Upper Extremity Assessment Upper Extremity Assessment: RUE deficits/detail;LUE deficits/detail RUE Deficits / Details: tremors likely from detoxing, grossly 4+/5 RUE Coordination: decreased fine motor LUE Deficits / Details: tremors likely from detoxing, grossly 4+/5  LUE Coordination: decreased fine motor   Lower Extremity Assessment Lower Extremity Assessment: Generalized weakness       Communication Communication Communication: No difficulties   Cognition Arousal/Alertness: Awake/alert Behavior During Therapy: WFL for tasks assessed/performed Overall Cognitive Status: Within Functional Limits for tasks assessed                                        General Comments  Seated: BP 162/96, MAP 113, HR 99     Home Living Family/patient expects to be discharged to:: Private residence Living Arrangements: Alone Available Help at Discharge: Friend(s);Available 24 hours/day Type of Home: House Home Access: Stairs to enter [employer] of Steps: 3 Entrance Stairs-Rails: None Home Layout: Two level;Full bath on main level;Bed/bath upstairs Alternate Level Stairs-Number of Steps: flight Alternate Level Stairs-Rails: Left Bathroom Shower/Tub: Tub/shower unit         Home Equipment: None          Prior Functioning/Environment Prior Level of Function : Independent/Modified Independent;Driving                        OT Problem List: Decreased strength;Decreased activity tolerance;Impaired balance (sitting and/or standing);Decreased safety awareness;Impaired UE functional use      OT Treatment/Interventions: Therapeutic exercise;Self-care/ADL training;Energy conservation;DME and/or AE instruction;Therapeutic activities;Patient/family education;Balance training    OT Goals(Current goals can be found in the care plan section) Acute Rehab OT Goals Patient Stated Goal: to go home OT Goal Formulation: With patient/family Time For Goal Achievement: 01/09/22 Potential to Achieve Goals: Good ADL Goals Pt Will Perform Grooming: Independently;standing Pt Will Perform Lower Body Dressing: Independently;sit to/from stand Pt Will Transfer to Toilet: Independently;ambulating;regular height toilet  OT Frequency: Min 2X/week    Co-evaluation              AM-PAC OT "6 Clicks" Daily Activity     Outcome Measure Help from another person eating meals?: A Little Help from another person taking care of personal grooming?: A Little Help from another person toileting, which includes using toliet, bedpan, or urinal?: A Little Help from another person bathing (including washing, rinsing, drying)?: A  Little Help from another person to put on and taking off regular upper body clothing?: A Little Help from another person to put on and taking off regular lower body clothing?: A Little 6 Click Score: 18   End of Session Nurse Communication: Mobility status  Activity Tolerance: Patient tolerated treatment well Patient left: in bed;with call bell/phone within reach;with family/visitor present  OT Visit Diagnosis: Other abnormalities of gait and mobility (R26.89);Unsteadiness on feet (R26.81)                Time: 1242-1310 OT Time Calculation (min): 28 min Charges:  OT General Charges $OT Visit: 1 Visit OT Evaluation $OT Eval Moderate Complexity: 1 Mod OT Treatments $Self Care/Home Management : 8-22 mins  Kathie Dike, M.S. OTR/L  12/26/21, 2:08 PM  ascom [phone]

## 2021-12-26 NOTE — Progress Notes (Signed)
NAME:  Lawrence Rowe, MRN:  [id number], DOB:  Jan 11, 1970, LOS: 3 ADMISSION DATE:  12/23/2021, CONSULTATION DATE: 12/25/2021 REFERRING MD:  Dorcas Carrow , REASON FOR CONSULT: Alcohol withdrawal  HPI  51 y.o with significant PMH of EtOH abuse and hypertension who presented to the ED with chief complaints of alcohol withdrawal symptoms.  Per ED reports, patient self-reports a 12 pack/day beer drinker with the last drink on 12/22/21.  Denies history of DTs or seizures   ED Course: Initial vital signs showed HR of 139 beats/minute, BP 162/95 mm Hg, the RR 20 breaths/minute, and the oxygen saturation 96 % on  RA and a temperature of 97.33F (36.5C). Abnormal Labs/Diagnostics Findings: CO2:17, Glucose: 113 , Creatinine:  0.60, Total Protein: 9.5  AST/AST:100/63, Total Bilirubin:1.8, Anion gap:16, Platelets:102.  CXR> No evidence of acute cardiopulmonary disease.  Patient persistently tachycardic hypertensive and tremulous after multiple doses of IV and p.o. benzos requiring admission to the hospital for alcohol withdrawal.  Hospitalist consulted admission.  Hospital Course: Patient admitted to progressive care unit started on Librium taper and placed on CIWA protocol.  On 12/16, patient became increasingly agitated, combative and hallucinating despite multiple doses of Ativan per CIWA protocol requiring security to be called.  He was transferred to the ICU and started on Precedex drip.  PCCM consulted  Past Medical History  EtOH abuse Former smoker Hypertension Chronic right lower extremity edema with venous stasis  Significant Hospital Events   12/15: Admitted to progressive care unit for alcohol withdrawal drip 12/16: Transferred to the ICU with worsening withdrawal symptoms and alcoholic hallucinosis.  Started on Precedex gtt.  PCCM consulted 12/18: Agitation/delirium improving precedex gtt stopped will transfer to [facility] to pick up on 12/19  Consults:  PCCM  Procedures:   None  Significant Diagnostic Tests:  12/15: Chest Xray>No evidence of acute cardiopulmonary disease.    Micro Data:  None   Antimicrobials:  None  OBJECTIVE  Blood pressure 100/60, pulse 62, temperature 97.8 F (36.6 C), temperature source Oral, resp. rate 14, height 6' (1.829 m), weight 98.5 kg, SpO2 95 %.        Intake/Output Summary (Last 24 hours) at 12/26/2021 0844 Last data filed at 12/26/2021 9326 Gross per 24 hour  Intake 3441.93 ml  Output 3080 ml  Net 361.93 ml   Filed Weights   12/23/21 1611 12/24/21 2340  Weight: 90.7 kg 98.5 kg   Physical Examination  GENERAL:Acutely-ill appearing male, NAD resting in bed on 2L O2 via nasal canula  HEENT: Supple, no JVD  LUNGS: Clear throughout, even, non labored  CARDIOVASCULAR: NSR, s1s2, rrr, no m/r/g, 2+ radial/1+ distal pulses, no edema  ABDOMEN: +BS x4, soft, obese, non tender, non distended  EXTREMITIES: Normal bulk and tone, moves all extremities  NEUROLOGIC: Alert and oriented, following commands, PERRLA, fine tremors of bilateral hands present  SKIN:` No skin ulcers, bruising.  Chronic bilateral lower extremity discoloration  Labs/imaging that I havepersonally reviewed  (right click and "Reselect all SmartList Selections" daily)     Labs   CBC: Recent Labs  Lab 12/23/21 1624 12/24/21 0315 12/25/21 0024  WBC 7.1 5.7 5.8  NEUTROABS 5.4 3.4 3.7  HGB 15.0 13.2 13.8  HCT 43.5 39.5 41.0  MCV 100.0 102.6* 103.8*  PLT 102* 81* 97*    Basic Metabolic Panel: Recent Labs  Lab 12/23/21 1624 12/24/21 0315 12/24/21 0700 12/25/21 0024  NA 141 141 142 140  K 3.9 3.6 3.3* 3.5  CL 108 110 113* 107  CO2 17* 21* 22 20*  GLUCOSE 113* 122* 128* 114*  BUN 12 15 14 14   CREATININE 0.60* 0.71 0.55* 0.77  CALCIUM 9.8 8.7* 8.6* 9.0  MG 1.9 1.9  --  1.8  PHOS  --   --   --  3.0   GFR: Estimated Creatinine Clearance: 129.9 mL/min (by C-G formula based on SCr of 0.77 mg/dL). Recent Labs  Lab 12/23/21 1624  12/24/21 0315 12/25/21 0024 12/25/21 0047  WBC 7.1 5.7 5.8  --   LATICACIDVEN  --   --   --  2.1*    Liver Function Tests: Recent Labs  Lab 12/23/21 1624 12/24/21 0315 12/24/21 0700 12/25/21 0024 12/25/21 0115  AST 100* 73* 63* 68* 67*  ALT 63* 48* 45* 44 44  ALKPHOS 60 47 45 45 53  BILITOT 1.8* 1.9* 2.1* 2.5* 2.7*  PROT 9.5* 7.9 7.2 7.7 7.7  ALBUMIN 5.0 4.0 3.8 4.0 4.1   No results for input(s): "LIPASE", "AMYLASE" in the last 168 hours. No results for input(s): "AMMONIA" in the last 168 hours.  ABG    Component Value Date/Time   HCO3 23.8 12/25/2021 0047   O2SAT 95 12/25/2021 0047     Coagulation Profile: No results for input(s): "INR", "PROTIME" in the last 168 hours.  Cardiac Enzymes: No results for input(s): "CKTOTAL", "CKMB", "CKMBINDEX", "TROPONINI" in the last 168 hours.  HbA1C: No results found for: "HGBA1C"  CBG: Recent Labs  Lab 12/25/21 0032 12/25/21 0306 12/25/21 0754 12/26/21 0027 12/26/21 0702  GLUCAP 111* 96 119* 112* 107*    Review of Systems: Positives in BOLD   Gen: Denies fever, chills, weight change, fatigue, night sweats HEENT: Denies blurred vision, double vision, hearing loss, tinnitus, sinus congestion, rhinorrhea, sore throat, neck stiffness, dysphagia PULM: Denies shortness of breath, cough, sputum production, hemoptysis, wheezing CV: Denies chest pain, edema, orthopnea, paroxysmal nocturnal dyspnea, palpitations GI: Denies abdominal pain, nausea, vomiting, diarrhea, hematochezia, melena, constipation, change in bowel habits GU: Denies dysuria, hematuria, polyuria, oliguria, urethral discharge Endocrine: Denies hot or cold intolerance, polyuria, polyphagia or appetite change Derm: Denies rash, dry skin, scaling or peeling skin change Heme: Denies easy bruising, bleeding, bleeding gums Neuro: Denies headache, numbness, weakness, slurred speech, loss of memory or consciousness  Past Medical History  He,  has a past medical  history of Alcohol abuse.   Surgical History    Past Surgical History:  Procedure Laterality Date   ELBOW SURGERY Left      Social History   reports that he quit smoking about 3 years ago. His smoking use included cigarettes. He has never used smokeless tobacco. He reports current alcohol use of about 12.0 standard drinks of alcohol per week. He reports that he does not use drugs.   Family History   His family history is not on file.   Allergies Allergies  Allergen Reactions   Penicillins Other (See Comments)     Home Medications  Prior to Admission medications   Medication Sig Start Date End Date Taking? Authorizing Provider  metoprolol succinate (TOPROL-XL) 50 MG 24 hr tablet Take 1 tablet (50 mg total) by mouth daily. Take with or immediately following a meal. 09/25/21  Yes 09/27/21, MD  ALPRAZolam Sharman Cheek) 1 MG tablet Take 1 mg by mouth at bedtime as needed for anxiety. Take 1 and half tablets daily prn Patient not taking: Reported on 12/23/2021    [provider]  cephALEXin (KEFLEX) 500 MG capsule Take 1 capsule (500 mg total) by  mouth 3 (three) times daily. Patient not taking: Reported on 12/23/2021 10/06/21   Deirdre Evener, MD  mupirocin ointment Idelle Jo) 2 % Apply to skin qd-bid Patient not taking: Reported on 12/23/2021 10/06/21   Deirdre Evener, MD  sulfamethoxazole-trimethoprim (BACTRIM DS) 800-160 MG tablet Take 1 tablet by mouth 2 (two) times daily. Patient not taking: Reported on 12/23/2021 09/25/21   Sharman Cheek, MD  sulfamethoxazole-trimethoprim (BACTRIM DS) 800-160 MG tablet Take 1 tablet x 7 days Patient not taking: Reported on 12/23/2021 10/06/21   Deirdre Evener, MD    Scheduled Meds:  chlordiazePOXIDE  25 mg Oral TID   Followed by   chlordiazePOXIDE  25 mg Oral BH-qamhs   Followed by   Melene Muller ON 12/27/2021] chlordiazePOXIDE  25 mg Oral Daily   Chlorhexidine Gluconate Cloth  6 each Topical Daily   folic acid  1 mg  Intravenous Daily   heparin  5,000 Units Subcutaneous Q8H   metoprolol succinate  50 mg Oral Daily   multivitamin with minerals  1 tablet Oral Daily   potassium chloride  20 mEq Oral BID   thiamine  100 mg Oral Daily   Or   thiamine  100 mg Intravenous Daily   Continuous Infusions:  dexmedetomidine Stopped (12/26/21 0727)   lactated ringers 100 mL/hr at 12/26/21 0828   PRN Meds:.acetaminophen **OR** acetaminophen, hydrALAZINE, LORazepam, morphine injection, ondansetron **OR** ondansetron (ZOFRAN) IV, mouth rinse, oxyCODONE   Active Hospital Problem list   Severe alcohol withdrawal Acute metabolic encephalopathy Elevated LFTs Chronic thrombocytopenia Hypertension  Assessment & Plan:  EtOH Withdrawal and alcoholic hallucinosis PMHx: ETOH Abuse and possible seizures/DT's Average Drinks: 12 pack beer/day Last Drink: 12/14 - Continue folic acid, mvi, and thiamine  - Continue libirum taper - Prn ativan for ETOH withdrawal and seizure activity  - Seizure precautions - Transition of care and psychiatry consulted for ETOH cessation resources - PT/OT evaluation for mobility   Acute Metabolic Encephalopathy in the setting of above~improving  - Provide supportive care - Promote normal sleep/wake cycle - Avoid sedating meds as able   Hypokalemia- Improving - Trend BMP  - Replace electrolytes as indication  - Monitor UOP    Acute on Chronic Thrombocytopenia- Likely alcohol induced - Trend CBC  - Monitor for s/sx of bleeding   Hypertension - Continue outpatient metoprolol   Best practice:  Diet: Carb modified  Pain/Anxiety/Delirium protocol (if indicated): No  VAP protocol (if indicated): Not indicated DVT prophylaxis: LMWH GI prophylaxis: N/A Glucose control:  SSI No Central venous access:  N/A Arterial line:  N/A Foley:  N/A Mobility:  bed rest  PT consulted: N/A Last date of multidisciplinary goals of care discussion [12/26/21] Code Status:  full  code Disposition: Stepdown     Critical care time: 35 minutes       Zada Girt, AGNP  Pulmonary/Critical Care Pager [phone] (please enter 7 digits) PCCM Consult Pager [phone] (please enter 7 digits)

## 2021-12-26 NOTE — Progress Notes (Signed)
Car keys, house key, and wallet sent home with Charm Barges (Significant Other - contact info listed in chart) as instructed by the patient.

## 2021-12-26 NOTE — Consult Note (Signed)
[facility] Face-to-Face Psychiatry Consult   Reason for Consult: Consult for 51 year old man brought into the hospital with symptoms of alcohol withdrawal. Referring Physician:  Dorene Grebe Patient Identification: Lawrence Rowe MRN:  [id number] Principal Diagnosis: Alcohol withdrawal (HCC) Diagnosis:  Principal Problem:   Alcohol withdrawal (HCC) Active Problems:   Tachycardia with hypertension   Transaminitis   Unilateral edema of lower extremity   Total Time spent with patient: 45 minutes  Subjective:   Lawrence Rowe is a 51 y.o. male patient admitted with "I had cramps in my legs".  HPI: Patient seen and chart reviewed.  Also spoke with his longtime friend who is here with him.  Patient described his reason for being in the hospital as being because he is having cramps in his legs which made it difficult for him to walk up and down the stairs at home.  Gradually he is a little more forthcoming in admitting that he had been drinking very heavily for a long time and that it had gotten more and more difficult for him to walk at home.  Patient has only vague understanding of what has happened between that time and now.  He apparently became agitated after having typical alcohol withdrawal symptoms and developed full-blown delirium tremens requiring management in the intensive care unit.  Patient denies being depressed.  Says he has some mild chronic anxiety.  Denies suicidal or homicidal thoughts.  He characterizes his drinking as being about 12 beers per day.  He denies that he is using any other drugs.  He admits that alcohol has been a problem for him but when asked to describe in what way all he can think of is this particular hospitalization.  I spoke with his longtime friend who tells me that he has been drinking heavily probably since 2008.  Quit work about 4 years ago.  Does almost nothing but drink 18 beers or more a day.  Has had a couple of attempts at stopping drinking but has never  gone more than a month at most.  No past history known of seizures no previous history known of DTs.  No identified other past mental health problems.  Past Psychiatric History: No previous psychiatric history identified.  Sounds like he has never really been involved in alcohol treatment specifically before.  Risk to Self:   Risk to Others:   Prior Inpatient Therapy:   Prior Outpatient Therapy:    Past Medical History:  Past Medical History:  Diagnosis Date   Alcohol abuse     Past Surgical History:  Procedure Laterality Date   ELBOW SURGERY Left    Family History: History reviewed. No pertinent family history. Family Psychiatric  History: See previous Social History:  Social History   Substance and Sexual Activity  Alcohol Use Yes   Alcohol/week: 12.0 standard drinks of alcohol   Types: 12 Cans of beer per week   Comment: occassional wine     Social History   Substance and Sexual Activity  Drug Use Never    Social History   Socioeconomic History   Marital status: Single    Spouse name: Not on file   Number of children: Not on file   Years of education: Not on file   Highest education level: Not on file  Occupational History   Not on file  Tobacco Use   Smoking status: Former    Types: Cigarettes    Quit date: 2020    Years since quitting: 3.9   Smokeless  tobacco: Never  Vaping Use   Vaping Use: Never used  Substance and Sexual Activity   Alcohol use: Yes    Alcohol/week: 12.0 standard drinks of alcohol    Types: 12 Cans of beer per week    Comment: occassional wine   Drug use: Never   Sexual activity: Not on file  Other Topics Concern   Not on file  Social History Narrative   Not on file   Social Determinants of Health   Financial Resource Strain: Not on file  Food Insecurity: No Food Insecurity (12/24/2021)   Hunger Vital Sign    Worried About Running Out of Food in the Last Year: Never true    Ran Out of Food in the Last Year: Never true   Transportation Needs: No Transportation Needs (12/24/2021)   PRAPARE - Administrator, Civil ServiceTransportation    Lack of Transportation (Medical): No    Lack of Transportation (Non-Medical): No  Physical Activity: Not on file  Stress: Not on file  Social Connections: Not on file   Additional Social History:    Allergies:   Allergies  Allergen Reactions   Penicillins Other (See Comments)    Labs:  Results for orders placed or performed during the hospital encounter of 12/23/21 (from the past 48 hour(s))  Comprehensive metabolic panel     Status: Abnormal   Collection Time: 12/25/21 12:24 AM  Result Value Ref Range   Sodium 140 135 - 145 mmol/L   Potassium 3.5 3.5 - 5.1 mmol/L   Chloride 107 98 - 111 mmol/L   CO2 20 (L) 22 - 32 mmol/L   Glucose, Bld 114 (H) 70 - 99 mg/dL    Comment: Glucose reference range applies only to samples taken after fasting for at least 8 hours.   BUN 14 6 - 20 mg/dL   Creatinine, Ser 1.610.77 0.61 - 1.24 mg/dL   Calcium 9.0 8.9 - 09.610.3 mg/dL   Total Protein 7.7 6.5 - 8.1 g/dL   Albumin 4.0 3.5 - 5.0 g/dL   AST 68 (H) 15 - 41 U/L   ALT 44 0 - 44 U/L   Alkaline Phosphatase 45 38 - 126 U/L   Total Bilirubin 2.5 (H) 0.3 - 1.2 mg/dL   GFR, Estimated >04>60 >54>60 mL/min    Comment: (NOTE) Calculated using the CKD-EPI Creatinine Equation (2021)    Anion gap 13 5 - 15    Comment: Performed at [facility], [address] [place], [place] [address]  CBC with Differential/Platelet     Status: Abnormal   Collection Time: 12/25/21 12:24 AM  Result Value Ref Range   WBC 5.8 4.0 - 10.5 K/uL   RBC 3.95 (L) 4.22 - 5.81 MIL/uL   Hemoglobin 13.8 13.0 - 17.0 g/dL   HCT 91.441.0 78.239.0 - 95.652.0 %   MCV 103.8 (H) 80.0 - 100.0 fL   MCH 34.9 (H) 26.0 - 34.0 pg   MCHC 33.7 30.0 - 36.0 g/dL   RDW 21.314.9 08.611.5 - 57.815.5 %   Platelets 97 (L) 150 - 400 K/uL    Comment: Immature Platelet Fraction may be clinically indicated, consider ordering this additional test [id number]    nRBC 0.0 0.0 - 0.2 %    Neutrophils Relative % 64 %   Neutro Abs 3.7 1.7 - 7.7 K/uL   Lymphocytes Relative 18 %   Lymphs Abs 1.1 0.7 - 4.0 K/uL   Monocytes Relative 16 %   Monocytes Absolute 0.9 0.1 - 1.0 K/uL   Eosinophils Relative  1 %   Eosinophils Absolute 0.1 0.0 - 0.5 K/uL   Basophils Relative 1 %   Basophils Absolute 0.1 0.0 - 0.1 K/uL   Immature Granulocytes 0 %   Abs Immature Granulocytes 0.02 0.00 - 0.07 K/uL    Comment: Performed at [facility], [address], [place], [place] [address]  Magnesium     Status: None   Collection Time: 12/25/21 12:24 AM  Result Value Ref Range   Magnesium 1.8 1.7 - 2.4 mg/dL    Comment: Performed at [facility], [address] [place], [place] [address]  Phosphorus     Status: None   Collection Time: 12/25/21 12:24 AM  Result Value Ref Range   Phosphorus 3.0 2.5 - 4.6 mg/dL    Comment: Performed at [facility], [address] [place], [place] [address]  Glucose, capillary     Status: Abnormal   Collection Time: 12/25/21 12:32 AM  Result Value Ref Range   Glucose-Capillary 111 (H) 70 - 99 mg/dL    Comment: Glucose reference range applies only to samples taken after fasting for at least 8 hours.  Lactic acid, plasma     Status: Abnormal   Collection Time: 12/25/21 12:47 AM  Result Value Ref Range   Lactic Acid, Venous 2.1 (HH) 0.5 - 1.9 mmol/L    Comment: CRITICAL RESULT CALLED TO, READ BACK BY AND VERIFIED WITH [facility] WINTERS AT 0155 12/25/2021 DLB Performed at [facility], [address] [place], [place] [address]   Blood gas, venous     Status: Abnormal   Collection Time: 12/25/21 12:47 AM  Result Value Ref Range   pH, Ven 7.44 (H) 7.25 - 7.43   pCO2, Ven 35 (L) 44 - 60 mmHg   pO2, Ven 76 (H) 32 - 45 mmHg   Bicarbonate 23.8 20.0 - 28.0 mmol/L   Acid-Base Excess 0.1 0.0 - 2.0 mmol/L   O2 Saturation 95 %   Patient temperature 37.0    Collection site VEIN     Comment: Performed at [facility],  [address] [place], [place] [address]  Hepatic function panel     Status: Abnormal   Collection Time: 12/25/21  1:15 AM  Result Value Ref Range   Total Protein 7.7 6.5 - 8.1 g/dL   Albumin 4.1 3.5 - 5.0 g/dL   AST 67 (H) 15 - 41 U/L   ALT 44 0 - 44 U/L   Alkaline Phosphatase 53 38 - 126 U/L   Total Bilirubin 2.7 (H) 0.3 - 1.2 mg/dL   Bilirubin, Direct 0.4 (H) 0.0 - 0.2 mg/dL   Indirect Bilirubin 2.3 (H) 0.3 - 0.9 mg/dL    Comment: Performed at [facility], [address] [place], [place] [address]  Glucose, capillary     Status: None   Collection Time: 12/25/21  3:06 AM  Result Value Ref Range   Glucose-Capillary 96 70 - 99 mg/dL    Comment: Glucose reference range applies only to samples taken after fasting for at least 8 hours.  Glucose, capillary     Status: Abnormal   Collection Time: 12/25/21  7:54 AM  Result Value Ref Range   Glucose-Capillary 119 (H) 70 - 99 mg/dL    Comment: Glucose reference range applies only to samples taken after fasting for at least 8 hours.  Glucose, capillary     Status: Abnormal   Collection Time: 12/26/21 12:27 AM  Result Value Ref Range   Glucose-Capillary 112 (  H) 70 - 99 mg/dL    Comment: Glucose reference range applies only to samples taken after fasting for at least 8 hours.  Glucose, capillary     Status: Abnormal   Collection Time: 12/26/21  7:02 AM  Result Value Ref Range   Glucose-Capillary 107 (H) 70 - 99 mg/dL    Comment: Glucose reference range applies only to samples taken after fasting for at least 8 hours.  Potassium     Status: Abnormal   Collection Time: 12/26/21 11:10 AM  Result Value Ref Range   Potassium 3.4 (L) 3.5 - 5.1 mmol/L    Comment: Performed at [facility], [address] [place], [place] [address]  Phosphorus     Status: None   Collection Time: 12/26/21 11:10 AM  Result Value Ref Range   Phosphorus 4.0 2.5 - 4.6 mg/dL    Comment: Performed at [facility], [address] [place], [place] [address]  Magnesium     Status: None   Collection Time: 12/26/21 11:10 AM  Result Value Ref Range   Magnesium 1.9 1.7 - 2.4 mg/dL    Comment: Performed at  [facility], [address] [place], [place] [address]  Glucose, capillary     Status: None   Collection Time: 12/26/21 12:35 PM  Result Value Ref Range   Glucose-Capillary 94 70 - 99 mg/dL    Comment: Glucose reference range applies only to samples taken after fasting for at least 8 hours.  Glucose, capillary     Status: None   Collection Time: 12/26/21  5:48 PM  Result Value Ref Range   Glucose-Capillary 95 70 - 99 mg/dL    Comment: Glucose reference range applies only to samples taken after fasting for at least 8 hours.    Current Facility-Administered Medications  Medication Dose Route Frequency Provider Last Rate Last Admin   acetaminophen (TYLENOL) tablet 650 mg  650 mg Oral Q6H PRN Zierle-Ghosh, Asia B, DO       Or   acetaminophen (TYLENOL) suppository 650 mg  650 mg Rectal Q6H PRN Zierle-Ghosh, Asia B, DO       chlordiazePOXIDE (LIBRIUM) capsule 25 mg  25 mg Oral Alfonse Alpers, MD   25 mg at 12/26/21 0910   Followed by   Melene Muller ON 12/27/2021] chlordiazePOXIDE (LIBRIUM) capsule 25 mg  25 mg Oral Daily Dorcas Carrow, MD       Chlorhexidine Gluconate Cloth 2 % PADS 6 each  6 each Topical Daily Dorcas Carrow, MD   6 each at 12/26/21 0200   dexmedetomidine (PRECEDEX) 400 MCG/100ML (4 mcg/mL) infusion  0.4-1.2 mcg/kg/hr Intravenous Titrated Otelia Sergeant, RPH   Stopped at 12/26/21 0727   enoxaparin (LOVENOX) injection 40 mg  40 mg Subcutaneous Q24H Glorianne Manchester, MD       [START ON 12/27/2021] folic acid (FOLVITE) tablet 1 mg  1 mg Oral Daily Tressie Ellis, RPH       hydrALAZINE (APRESOLINE) injection 20 mg  20 mg Intravenous Q8H PRN Chawla, Harsh, MD   20 mg at 12/26/21 1706   LORazepam (ATIVAN) injection 1-2 mg  1-2 mg Intravenous Q1H PRN Jimmye Norman, NP   2 mg at 12/26/21  1627   metoprolol succinate (TOPROL-XL) 24 hr tablet 50 mg  50 mg Oral Daily Zierle-Ghosh, Asia B, DO   50 mg at 12/26/21 0909   morphine (PF) 2 MG/ML injection 2 mg  2 mg Intravenous Q2H PRN Zierle-Ghosh, Asia B, DO  2 mg at 12/25/21 1528   multivitamin with minerals tablet 1 tablet  1 tablet Oral Daily Zierle-Ghosh, Asia B, DO   1 tablet at 12/26/21 0909   ondansetron (ZOFRAN) tablet 4 mg  4 mg Oral Q6H PRN Zierle-Ghosh, Asia B, DO       Or   ondansetron (ZOFRAN) injection 4 mg  4 mg Intravenous Q6H PRN Zierle-Ghosh, Asia B, DO       Oral care mouth rinse  15 mL Mouth Rinse PRN Zierle-Ghosh, Asia B, DO       oxyCODONE (Oxy IR/ROXICODONE) immediate release tablet 5 mg  5 mg Oral Q4H PRN Zierle-Ghosh, Asia B, DO       thiamine (VITAMIN B1) tablet 100 mg  100 mg Oral Daily Zierle-Ghosh, Asia B, DO   100 mg at 12/26/21 0909   Or   thiamine (VITAMIN B1) injection 100 mg  100 mg Intravenous Daily Zierle-Ghosh, Asia B, DO   100 mg at 12/25/21 1134    Musculoskeletal: Strength & Muscle Tone: decreased Gait & Station: ataxic Patient leans: N/A            Psychiatric Specialty Exam:  Presentation  General Appearance: No data recorded Eye Contact:No data recorded Speech:No data recorded Speech Volume:No data recorded Handedness:No data recorded  Mood and Affect  Mood:No data recorded Affect:No data recorded  Thought Process  Thought Processes:No data recorded Descriptions of Associations:No data recorded Orientation:No data recorded Thought Content:No data recorded History of Schizophrenia/Schizoaffective disorder:No data recorded Duration of Psychotic Symptoms:No data recorded Hallucinations:No data recorded Ideas of Reference:No data recorded Suicidal Thoughts:No data recorded Homicidal Thoughts:No data recorded  Sensorium  Memory:No data recorded Judgment:No data recorded Insight:No data recorded  Executive Functions  Concentration:No data recorded Attention  Span:No data recorded Recall:No data recorded Fund of Knowledge:No data recorded Language:No data recorded  Psychomotor Activity  Psychomotor Activity:No data recorded  Assets  Assets:No data recorded  Sleep  Sleep:No data recorded  Physical Exam: Physical Exam Vitals reviewed.  Constitutional:      Appearance: Normal appearance.  HENT:     Head: Normocephalic and atraumatic.     Mouth/Throat:     Pharynx: Oropharynx is clear.  Eyes:     Pupils: Pupils are equal, round, and reactive to light.  Cardiovascular:     Rate and Rhythm: Normal rate and regular rhythm.  Pulmonary:     Effort: Pulmonary effort is normal.     Breath sounds: Normal breath sounds.  Abdominal:     General: Abdomen is flat.     Palpations: Abdomen is soft.  Musculoskeletal:        General: Normal range of motion.  Skin:    General: Skin is warm and dry.  Neurological:     General: No focal deficit present.     Mental Status: He is alert. Mental status is at baseline.  Psychiatric:        Attention and Perception: Attention normal.        Mood and Affect: Mood normal. Affect is blunt.        Speech: Speech normal.        Behavior: Behavior is cooperative.        Thought Content: Thought content normal. Thought content does not include homicidal or suicidal ideation.        Cognition and Memory: Memory is impaired.        Judgment: Judgment is inappropriate.    Review of Systems  Constitutional: Negative.   HENT: Negative.  Eyes: Negative.   Respiratory: Negative.    Cardiovascular: Negative.   Gastrointestinal: Negative.   Musculoskeletal: Negative.   Skin: Negative.   Neurological:  Positive for tingling.  Psychiatric/Behavioral:  Positive for memory loss and substance abuse. Negative for depression, hallucinations and suicidal ideas. The patient is nervous/anxious. The patient does not have insomnia.    Blood pressure (!) 164/95, pulse 91, temperature 98.1 F (36.7 C), temperature  source Oral, resp. rate 17, height 6' (1.829 m), weight 98.5 kg, SpO2 96 %. Body mass index is 29.45 kg/m.  Treatment Plan Summary: Plan this is a 51 year old man with alcohol abuse who went through delirium tremens.  He is still somewhat confused.  During the conversation he would at times describe himself as having been home yesterday.  Clearly he has lost his memories for the events of the DTs which is typical.  His friend tells me that she and multiple members of his family have decided to really push for him to get into substance abuse treatment.  I spoke with the patient and tried to gently point out how dangerous these medical problems were and suggest to him that it would be a good idea to stop drinking.  At this point he seems to only be at the point of maybe acknowledging he should "cut back".  Family is looking into [facility] which would be a very good option.  No change to any orders which are all fine for the detox.  No need of any other psychiatric medicine.  We will follow up as needed.  Disposition: No evidence of imminent risk to self or others at present.   Patient does not meet criteria for psychiatric inpatient admission. Supportive therapy provided about ongoing stressors.  Mordecai Rasmussen, MD 12/26/2021 6:03 PM

## 2021-12-26 NOTE — Progress Notes (Signed)
Precedex gtt turned off this morning at around 0730. Pt has been alert and oriented all shift with some forgetful moments. Pt required Ativan once today when he said he could feel himself getting anxious and noticeably sweating. Tremors are still present. Pt has been hypertensive towards end of shift and required PRN hydralazine. Pt's friend has been at bedside throughout the day.

## 2021-12-27 DIAGNOSIS — E876 Hypokalemia: Secondary | ICD-10-CM | POA: Insufficient documentation

## 2021-12-27 DIAGNOSIS — F10931 Alcohol use, unspecified with withdrawal delirium: Secondary | ICD-10-CM | POA: Diagnosis not present

## 2021-12-27 DIAGNOSIS — E512 Wernicke's encephalopathy: Secondary | ICD-10-CM | POA: Diagnosis not present

## 2021-12-27 DIAGNOSIS — K7010 Alcoholic hepatitis without ascites: Secondary | ICD-10-CM | POA: Diagnosis not present

## 2021-12-27 LAB — BASIC METABOLIC PANEL
Anion gap: 11 (ref 5–15)
BUN: 12 mg/dL (ref 6–20)
CO2: 21 mmol/L — ABNORMAL LOW (ref 22–32)
Calcium: 9.2 mg/dL (ref 8.9–10.3)
Chloride: 107 mmol/L (ref 98–111)
Creatinine, Ser: 0.65 mg/dL (ref 0.61–1.24)
GFR, Estimated: >60 mL/min (ref >60)
Glucose, Bld: 117 mg/dL — ABNORMAL HIGH (ref 70–99)
Potassium: 3.2 mmol/L — ABNORMAL LOW (ref 3.5–5.1)
Sodium: 139 mmol/L (ref 135–145)

## 2021-12-27 LAB — GLUCOSE, CAPILLARY
Glucose-Capillary: 124 mg/dL — ABNORMAL HIGH (ref 70–99)
Glucose-Capillary: 99 mg/dL (ref 70–99)

## 2021-12-27 LAB — MAGNESIUM: Magnesium: 1.8 mg/dL (ref 1.7–2.4)

## 2021-12-27 LAB — PHOSPHORUS: Phosphorus: 4 mg/dL (ref 2.5–4.6)

## 2021-12-27 MED ORDER — LORAZEPAM 2 MG/ML IJ SOLN
1.0000 mg | INTRAMUSCULAR | Status: DC | PRN
  Administered 2021-12-27 (×4): 4 mg via INTRAVENOUS
  Filled 2021-12-27 (×5): qty 2

## 2021-12-27 MED ORDER — HALOPERIDOL LACTATE 5 MG/ML IJ SOLN
2.0000 mg | Freq: Four times a day (QID) | INTRAMUSCULAR | Status: DC | PRN
  Administered 2021-12-27 – 2021-12-29 (×5): 2 mg via INTRAVENOUS
  Filled 2021-12-27 (×5): qty 1

## 2021-12-27 MED ORDER — POTASSIUM CHLORIDE CRYS ER 20 MEQ PO TBCR
40.0000 meq | EXTENDED_RELEASE_TABLET | ORAL | Status: AC
  Administered 2021-12-27 (×2): 40 meq via ORAL
  Filled 2021-12-27 (×2): qty 2

## 2021-12-27 MED ORDER — PHENOBARBITAL SODIUM 130 MG/ML IJ SOLN
65.0000 mg | Freq: Three times a day (TID) | INTRAMUSCULAR | Status: DC
  Administered 2021-12-29 – 2021-12-30 (×3): 65 mg via INTRAVENOUS
  Filled 2021-12-27: qty 0.5
  Filled 2021-12-27 (×3): qty 1

## 2021-12-27 MED ORDER — POTASSIUM CHLORIDE 10 MEQ/100ML IV SOLN
10.0000 meq | Freq: Once | INTRAVENOUS | Status: AC
  Administered 2021-12-27: 10 meq via INTRAVENOUS
  Filled 2021-12-27 (×2): qty 100

## 2021-12-27 MED ORDER — LORAZEPAM 1 MG PO TABS: 1.0000 mg | ORAL_TABLET | ORAL | Status: DC | PRN

## 2021-12-27 MED ORDER — THIAMINE HCL 100 MG/ML IJ SOLN
500.0000 mg | Freq: Three times a day (TID) | INTRAVENOUS | Status: AC
  Administered 2021-12-27 – 2021-12-30 (×9): 500 mg via INTRAVENOUS
  Filled 2021-12-27 (×9): qty 5

## 2021-12-27 MED ORDER — LORAZEPAM 2 MG/ML IJ SOLN
1.0000 mg | INTRAMUSCULAR | Status: DC | PRN
  Administered 2021-12-28 – 2021-12-30 (×3): 1 mg via INTRAVENOUS
  Filled 2021-12-27 (×3): qty 1

## 2021-12-27 MED ORDER — PHENOBARBITAL SODIUM 65 MG/ML IJ SOLN: 32.5000 mg | Freq: Three times a day (TID) | INTRAMUSCULAR | Status: DC

## 2021-12-27 MED ORDER — PHENOBARBITAL SODIUM 130 MG/ML IJ SOLN
97.5000 mg | Freq: Three times a day (TID) | INTRAMUSCULAR | Status: DC
  Administered 2021-12-27 – 2021-12-29 (×6): 97.5 mg via INTRAVENOUS
  Filled 2021-12-27 (×6): qty 1

## 2021-12-27 NOTE — Evaluation (Signed)
Physical Therapy Evaluation Patient Details Name: Lawrence Rowe MRN: [id number] DOB: 15-Apr-1970 Today's Date: 12/27/2021  History of Present Illness  presented to ER secondary to cramping, tremors and palpitations associated with ETOH withdrawal; admitted for management of ETOH withdrawal, wernicke's encephalopathy  Clinical Impression  Patient seated in [facility] upon arrival to session, RN at bedside.  RN endorses increased agitation earlier in the day, but has calmed at this point; cleared to participate with session as tolerated.  Patient assisted with return to bed, unsupported sitting edge of bed, min assist after toileting needs for formal assessment. Bilat UE/LE strength (4 to 4+/5) and ROM grossly symmetrical and WFL; moderately tremulous with all activities, but no focal weakness, sensory deficits appreciated.  Currently requiring min assist for bed mobility; min assist +2 for sit/stand, standing balance and gait (40') with bilat HHA.  Demonstrates inconsistent step height/length and overall foot placement; generally tremulous with marked impairment in balance reactions, requiring physical assist at all times for balance recovery/fall prevention. Calm and cooperative throughout session; intermittently restless/agitated at times, but easily redirectable at this time.  Per RN, recommends +2 with all sessions for patient and staff safety at this time. Would benefit from skilled PT to address above deficits and promote optimal return to PLOF.; recommend transition to STR upon discharge from acute hospitalization.       Recommendations for follow up therapy are one component of a multi-disciplinary discharge planning process, led by the attending physician.  Recommendations may be updated based on patient status, additional functional criteria and insurance authorization.  Follow Up Recommendations Skilled nursing-short term rehab (<3 hours/day) Can patient physically be transported by  private vehicle: Yes    Assistance Recommended at Discharge Frequent or constant Supervision/Assistance  Patient can return home with the following  A lot of help with walking and/or transfers;A lot of help with bathing/dressing/bathroom    Equipment Recommendations Rolling walker (2 wheels)  Recommendations for Other Services       Functional Status Assessment Patient has had a recent decline in their functional status and demonstrates the ability to make significant improvements in function in a reasonable and predictable amount of time.     Precautions / Restrictions Precautions Precautions: Fall Restrictions Weight Bearing Restrictions: No      Mobility  Bed Mobility Overal bed mobility: Needs Assistance Bed Mobility: Sit to Supine     Supine to sit: Min guard, Min assist     General bed mobility comments: generally discoordinated and impulsive movements    Transfers Overall transfer level: Needs assistance Equipment used: 2 person hand held assist Transfers: Sit to/from Stand Sit to Stand: Min assist, +2 physical assistance                Ambulation/Gait Ambulation/Gait assistance: Min assist Gait Distance (Feet): 40 Feet Assistive device: 2 person hand held assist         General Gait Details: inconsistent step height/length and overall foot placement; generally tremulous with marked impairment in balance reactions, requiring physical assist at all times for balance recovery/fall prevention  Stairs            Wheelchair Mobility    Modified Rankin (Stroke Patients Only)       Balance Overall balance assessment: Needs assistance Sitting-balance support: No upper extremity supported, Feet supported Sitting balance-Leahy Scale: Fair     Standing balance support: Bilateral upper extremity supported Standing balance-Leahy Scale: Poor  Pertinent Vitals/Pain Pain Assessment Pain Assessment:  No/denies pain    Home Living Family/patient expects to be discharged to:: Private residence Living Arrangements: Alone Available Help at Discharge: Available PRN/intermittently Type of Home: House Home Access: Stairs to enter   [employer] of Steps: 3   Home Layout: Two level;Full bath on main level;Bed/bath upstairs Home Equipment: None Additional Comments: Patient questionable historian; information obtained from OT evaluation.  Will verify with family as available.    Prior Function Prior Level of Function : Independent/Modified Independent             Mobility Comments: Indep with ADLs, household and Audiological scientist        Extremity/Trunk Assessment   Upper Extremity Assessment Upper Extremity Assessment: Generalized weakness (grossly 4 to 4+/5 throughout; generalized tremors throughout bilat UEs)    Lower Extremity Assessment Lower Extremity Assessment: Generalized weakness (grossly 4 to 4+/5; generalized tremors, poor balance reactions throughout)       Communication   Communication: No difficulties  Cognition Arousal/Alertness: Awake/alert Behavior During Therapy: Agitated, Anxious Overall Cognitive Status: Difficult to assess                                 General Comments: Oriented to self, general situation; generally impulsive with poor insight into deficits and need for assist; generally restless and easily agitated (but easily redirectable during session)        General Comments      Exercises Other Exercises Other Exercises: Sit/stand from [facility] with UE support (on bedrails), cga/min assist for safety Other Exercises: Transitioned to chair position in bed end of session for improved alertness, repositioning and pulmonary hygiene.  Toleraitng well; needs in reach.  RN informed/aware   Assessment/Plan    PT Assessment Patient needs continued PT services  PT Problem List Decreased safety  awareness;Decreased activity tolerance;Decreased strength;Decreased balance;Decreased mobility;Decreased coordination;Decreased knowledge of use of DME;Decreased cognition;Decreased knowledge of precautions;Cardiopulmonary status limiting activity       PT Treatment Interventions DME instruction;Gait training;Functional mobility training;Therapeutic activities;Therapeutic exercise;Stair training;Balance training;Cognitive remediation;Patient/family education    PT Goals (Current goals can be found in the Care Plan section)  Acute Rehab PT Goals Patient Stated Goal: unable to identify specific goal, did voice interest in 'getting better' PT Goal Formulation: Patient unable to participate in goal setting Time For Goal Achievement: 01/10/22 Potential to Achieve Goals: Fair    Frequency Min 2X/week     Co-evaluation               AM-PAC PT "6 Clicks" Mobility  Outcome Measure Help needed turning from your back to your side while in a flat bed without using bedrails?: None Help needed moving from lying on your back to sitting on the side of a flat bed without using bedrails?: A Little Help needed moving to and from a bed to a chair (including a wheelchair)?: A Little Help needed standing up from a chair using your arms (e.g., wheelchair or bedside chair)?: A Little Help needed to walk in hospital room?: A Little Help needed climbing 3-5 steps with a railing? : A Little 6 Click Score: 19    End of Session   Activity Tolerance: Patient tolerated treatment well Patient left: with call bell/phone within reach;in bed;with bed alarm set;with family/visitor present Nurse Communication: Mobility status PT Visit Diagnosis: Muscle weakness (generalized) (M62.81);Difficulty in walking, not elsewhere classified (  R26.2)    Time: 5056-9794 PT Time Calculation (min) (ACUTE ONLY): 23 min   Charges:   PT Evaluation $PT Eval Moderate Complexity: 1 Mod          Vernetta Dizdarevic H. Manson Passey, PT,  DPT, NCS 12/27/21, 11:29 PM [phone]

## 2021-12-27 NOTE — TOC Initial Note (Signed)
Transition of Care [facility]) - Initial/Assessment Note    Patient Details  Name: Lawrence Rowe MRN: [id number] Date of Birth: January 12, 1970  Transition of Care [facility]) CM/SW Contact:    Allayne Butcher, RN Phone Number: 12/27/2021, 3:08 PM  Clinical Narrative:                 Patient admitted with alcohol withdrawal.  TOC acknowledges consult for substance abuse resources.  When patient is more alert and less agitated TOC will meet with patient and provide him with resources.   Expected Discharge Plan: Home/Self Care Barriers to Discharge: Continued Medical Work up   Patient Goals and CMS Choice        Expected Discharge Plan and Services Expected Discharge Plan: Home/Self Care   Discharge Planning Services: CM Consult   Living arrangements for the past 2 months: Single Family Home                                      Prior Living Arrangements/Services Living arrangements for the past 2 months: Single Family Home   Patient language and need for interpreter reviewed:: Yes        Need for Family Participation in Patient Care: Yes (Comment) Care giver support system in place?: Yes (comment)   Criminal Activity/Legal Involvement Pertinent to Current Situation/Hospitalization: No - Comment as needed  Activities of Daily Living Home Assistive Devices/Equipment: None ADL Screening (condition at time of admission) Patient's cognitive ability adequate to safely complete daily activities?: Yes Is the patient deaf or have difficulty hearing?: No Does the patient have difficulty seeing, even when wearing glasses/contacts?: No Does the patient have difficulty concentrating, remembering, or making decisions?: No Patient able to express need for assistance with ADLs?: Yes Does the patient have difficulty dressing or bathing?: No Independently performs ADLs?: Yes (appropriate for developmental age) Does the patient have difficulty walking or climbing stairs?: No Weakness  of Legs: None Weakness of Arms/Hands: None  Permission Sought/Granted      Share Information with NAME: Arwin Bisceglia     Permission granted to share info w Relationship: mother  Permission granted to share info w Contact Information: [phone]  Emotional Assessment Appearance:: Appears stated age     Orientation: : Fluctuating Orientation (Suspected and/or reported Sundowners) Alcohol / Substance Use: Alcohol Use Psych Involvement: Yes (comment)  Admission diagnosis:  Alcohol withdrawal (HCC) [F10.939] Alcohol withdrawal syndrome with complication [facility]) [F10.939] Patient Active Problem List   Diagnosis Date Noted   Hypokalemia 12/27/2021   Wernicke encephalopathy syndrome 12/27/2021   Delirium tremens (HCC) 12/27/2021   Alcoholic hepatitis 12/27/2021   Alcohol withdrawal (HCC) 12/23/2021   Transaminitis 12/23/2021   Unilateral edema of lower extremity 12/23/2021   Dermatitis 05/10/2021   Primary hypertension 05/10/2021   Tachycardia with hypertension 04/19/2021   PAD (peripheral artery disease) (HCC) 04/19/2021   Alcohol abuse 04/19/2021   Metabolic syndrome 04/19/2021   PCP:  Baldwin Jamaica, MD Pharmacy:   [facility] - [place], [place] - [address] [place] [address] Phone: [phone] Fax: [phone]     Social Determinants of Health (SDOH) Interventions    Readmission Risk Interventions     No data to display

## 2021-12-27 NOTE — Progress Notes (Incomplete)
       CROSS COVER NOTE  NAME: QUINT CHESTNUT MRN: [id number] DOB : 24-Jan-1970 ATTENDING PHYSICIAN: Marrion Coy, MD    Date of Service   12/27/2021   HPI/Events of Note   Message received from RN reporting Mr Southwell is agitated. She is concerned he will fall or become combative. He has received 4 mg of IV Ativan x4 since ~1230 and his CIWA has been 20+ since 0900 per nursing documentation in chart. Haldol also given around 1800. On bedside evaluation Mr Sollenberger is ambulating in room with walker and staff present. He is unsteady on his feet and stumbled multiple times while I was at bedside. He is tremulous. He is currently slightly confused. He can answer orientation questions appropriately but will then say he is at home and needs to go downstairs to talk to [name] (girlfriend) to get his phone/wallet.    0400: I spent ~25 mins at bedside talking with patient, at this time he is anxious but converses with me. He is still goal directed and wants to get out of bed to go downstairs to find his cell phone and get food. He requires frequent redirection but was able to be convinced to stay in bed for me. He was not verbally inappropriate and he did not attempt to grab or hit me at any time.  Interventions   Assessment/Plan:  Phenobarbital Taper with PRN Ativan Sitter  Daily EKG for qtc surveillance     To reach the provider On-Call:   7AM- 7PM see care teams to locate the attending and reach out to them via [email or web address]. 7PM-7AM contact night-coverage If you still have difficulty reaching the appropriate provider, please page the [facility] (Director on Call) for [facility] on amion for assistance  This document was prepared using Sales executive software and may include unintentional dictation errors.  Bishop Limbo DNP, MBA, FNP-BC Nurse Practitioner [facility] Pager [phone]

## 2021-12-27 NOTE — Hospital Course (Addendum)
Lawrence Rowe is a 51 y.o. male with medical history significant of alcohol abuse with history of primary hypertension, presents to the ED with a chief complaint of alcohol withdrawal. Patient was transferred to ICU for increased agitation, was placed on Precedex. Condition appears to be improving, he was able to be transferred out of ICU today. However, patient still has significant confusion, tremor, anxiety.  He has significant memory defect.  Restarted CIWA protocol, started high-dose thiamine for Warnicke Korsakoff syndrome.

## 2021-12-27 NOTE — Progress Notes (Signed)
  Progress Note   Patient: Lawrence Rowe DOB: 1970/09/19 DOA: 12/23/2021     4 DOS: the patient was seen and examined on 12/27/2021   Brief hospital course: Lawrence Rowe is a 51 y.o. male with medical history significant of alcohol abuse with history of primary hypertension, presents to the ED with a chief complaint of alcohol withdrawal. Patient was transferred to ICU for increased agitation, was placed on Precedex. Condition appears to be improving, he was able to be transferred out of ICU today. However, patient still has significant confusion, tremor, anxiety.  He has significant memory defect.  Restarted CIWA protocol, started high-dose thiamine for Warnicke Korsakoff syndrome.  Assessment and Plan: Alcohol withdrawal with delirium tremens.  POA. Warnicke encephalopathy. Patient currently off Precedex, still has significant agitation and confusion.  Required one-to-one monitoring.  I will restart CIWA protocol. Patient condition is also more consistent with Warnicke encephalopathy, start high-dose thiamine. Patient will be continued monitored closely in the stepdown unit.  Alcoholic hepatitis. Thrombocytopenia secondary to alcohol drinking. Continue to monitor for now.  Hypokalemia. Repleted again, monitor magnesium and phosphorus.  Essential hypertension. Continue metoprolol.      Subjective:  Patient still has significant agitation with confusion, he is making up stories. No shortness of breath or cough.  Physical Exam: Vitals:   12/27/21 0300 12/27/21 0400 12/27/21 0500 12/27/21 0600  BP: (!) 155/86 (!) 160/97 (!) 140/83   Pulse: 93 (!) 103 99   Resp: 18 19 13 17   Temp:      TempSrc:      SpO2: 97% 95% 95%   Weight:      Height:       General exam: Appears calm and comfortable  Respiratory system: Clear to auscultation. Respiratory effort normal. Cardiovascular system: S1 & S2 heard, RRR. No JVD, murmurs, rubs, gallops or clicks.  No pedal edema. Gastrointestinal system: Abdomen is nondistended, soft and nontender. No organomegaly or masses felt. Normal bowel sounds heard. Central nervous system: Agitated and confused, no focal neurologic changes. Extremities: Symmetric 5 x 5 power. Skin: No rashes, lesions or ulcers Psychiatry: Flat affect.  Data Reviewed:  Lab results reviewed.  Family Communication: None  Disposition: Status is: Inpatient Remains inpatient appropriate because: Severity of disease, multiple IV treatments.  Planned Discharge Destination: Home    Time spent: 50 minutes  Author: , MD 12/27/2021 11:56 AM  For on call review [email or web address].

## 2021-12-28 DIAGNOSIS — F10931 Alcohol use, unspecified with withdrawal delirium: Secondary | ICD-10-CM | POA: Diagnosis not present

## 2021-12-28 LAB — BASIC METABOLIC PANEL WITH GFR
Anion gap: 7 (ref 5–15)
BUN: 15 mg/dL (ref 6–20)
CO2: 23 mmol/L (ref 22–32)
Calcium: 8.9 mg/dL (ref 8.9–10.3)
Chloride: 109 mmol/L (ref 98–111)
Creatinine, Ser: 0.72 mg/dL (ref 0.61–1.24)
GFR, Estimated: >60 mL/min
Glucose, Bld: 99 mg/dL (ref 70–99)
Potassium: 3.5 mmol/L (ref 3.5–5.1)
Sodium: 139 mmol/L (ref 135–145)

## 2021-12-28 LAB — GLUCOSE, CAPILLARY
Glucose-Capillary: 100 mg/dL — ABNORMAL HIGH (ref 70–99)
Glucose-Capillary: 104 mg/dL — ABNORMAL HIGH (ref 70–99)
Glucose-Capillary: 98 mg/dL (ref 70–99)

## 2021-12-28 LAB — PHOSPHORUS: Phosphorus: 5 mg/dL — ABNORMAL HIGH (ref 2.5–4.6)

## 2021-12-28 LAB — CBC
HCT: 38.9 % — ABNORMAL LOW (ref 39.0–52.0)
Hemoglobin: 12.8 g/dL — ABNORMAL LOW (ref 13.0–17.0)
MCH: 34.6 pg — ABNORMAL HIGH (ref 26.0–34.0)
MCHC: 32.9 g/dL (ref 30.0–36.0)
MCV: 105.1 fL — ABNORMAL HIGH (ref 80.0–100.0)
Platelets: 113 K/uL — ABNORMAL LOW (ref 150–400)
RBC: 3.7 MIL/uL — ABNORMAL LOW (ref 4.22–5.81)
RDW: 14.6 % (ref 11.5–15.5)
WBC: 7 K/uL (ref 4.0–10.5)
nRBC: 0 % (ref 0.0–0.2)

## 2021-12-28 LAB — MAGNESIUM: Magnesium: 2 mg/dL (ref 1.7–2.4)

## 2021-12-28 NOTE — Progress Notes (Signed)
Patient with multiple attempts to get out of bed despite phenobarbital, haldol, and ativan doses. Patient is not redirectable stating that staff are "[employer] personnel that will not leave his house" and that he "needs to go downstairs to change the batteries so that the noise can stop." Staff explained the current situation to the patient, but he still refuses to follow the safety directions of staff. Sitter ordered for safety and is present at bedside at this time.  Carmel Sacramento, RN

## 2021-12-28 NOTE — TOC Progression Note (Signed)
Transition of Care [facility]) - Progression Note    Patient Details  Name: Lawrence Rowe MRN: [id number] Date of Birth: Aug 25, 1970  [facility] CM/SW Contact  Shelbie Hutching, RN Phone Number: 12/28/2021, 3:46 PM  Clinical Narrative:    Met with patient and his mother at the bedside this morning.  Patient lives alone.  PT recommending SNF, patient and his mother agree to SNF workup.  Mother prefers [facility], she reports that she lives there.  Will send the referral to all facilities in the area.    Planned Disposition: Skilled Nursing Facility Barriers to Discharge: Continued Medical Work up  Expected Discharge Plan and Services   Discharge Planning Services: CM Consult Post Acute Care Choice: [place] Living arrangements for the past 2 months: Single Family Home                                       Social Determinants of Health (SDOH) Interventions SDOH Screenings   Food Insecurity: No Food Insecurity (12/24/2021)  Housing: Low Risk  (12/24/2021)  Transportation Needs: No Transportation Needs (12/24/2021)  Utilities: Not At Risk (12/24/2021)  Depression (PHQ2-9): Low Risk  (04/19/2021)  Tobacco Use: Medium Risk (12/24/2021)    Readmission Risk Interventions     No data to display

## 2021-12-28 NOTE — Progress Notes (Addendum)
PROGRESS NOTE    Lawrence Rowe  [id number] DOB: 1970/05/15 DOA: 12/23/2021 PCP: George Hugh, MD   Brief Narrative: This 51 yrs old Male with PMH significant for alcohol abuse , Essential hypertension, presents to the ED with concern of alcohol withdrawal. Patient was transferred to ICU for increased agitation, He was placed on Precedex. Condition appears to be improving, he was able to be transferred out of ICU today. However, patient still has significant confusion, tremor, anxiety.  He has significant memory defect. He is restarted on CIWA protocol, started high-dose thiamine for Warnicke Korsakoff syndrome.  Assessment & Plan:   Principal Problem:   Alcohol withdrawal ([place]) Active Problems:   Tachycardia with hypertension   PAD (peripheral artery disease) ([facility])   Alcohol abuse   Primary hypertension   Transaminitis   Unilateral edema of lower extremity   Hypokalemia   Wernicke encephalopathy syndrome   Delirium tremens ([place])   Alcoholic hepatitis  ETOH withdrawal with delirium tremens, POA: Wernicke's encephalopathy: Patient is currently off Precedex. Still has significant agitation and confusion. Continue one-to-one monitoring, Continue CIWA protocol. Patient's condition is more consistent with Warnicke's encephalopathy, Continue high-dose thiamine. Continue neurochecks and behavior. Patient was agitated and restless, now requiring phenobarbital taper with as needed Ativan.  Alcoholic hepatitis: Thrombocytopenia: Due to ETOH use. Continue supportive care.   Hypokalemia: Replaced.  Resolved   Essential hypertension: Continue metoprolol. Continue hydralazine as needed   DVT prophylaxis: Lovenox Code Status: Full code Family Communication: No family at bed side. Disposition Plan:   Status is: Inpatient Remains inpatient appropriate because: Acute for EtOH withdrawal with delirium tremens.  Requiring CIWA protocol now requiring phenobarbital  taper.    Consultants:  PCCM  Procedures: None Antimicrobials: None  Subjective: Patient was seen and examined at bedside.  Overnight events noted.   Patient still has significant tremors, tremulousness , denies any chest pain or shortness of breath.  Objective: Vitals:   12/28/21 1300 12/28/21 1400 12/28/21 1500 12/28/21 1600  BP:  (!) 147/92  (!) 146/96  Pulse: 92 99 89 88  Resp: 12 20 17 16   Temp:    98.7 F (37.1 C)  TempSrc:    Oral  SpO2: 97%  97% 100%  Weight:      Height:        Intake/Output Summary (Last 24 hours) at 12/28/2021 1658 Last data filed at 12/28/2021 1300 Gross per 24 hour  Intake 100 ml  Output 1550 ml  Net -1450 ml   Filed Weights   12/23/21 1611 12/24/21 2340  Weight: 90.7 kg 98.5 kg    Examination:  General exam: Appears comfortable, not in any acute distress, deconditioned. Respiratory system: CTA bilaterally, respiratory effort normal, RR 15. Cardiovascular system: S1 & S2 heard, regular rate and rhythm, no murmur. Gastrointestinal system: Abdomen is soft, non tender, non distended, BS+ Central nervous system: Alert and oriented X 3. No focal neurological deficits. Extremities: Symmetric 5 x 5 power. Skin: No rashes, lesions or ulcers Psychiatry: Judgement and insight appear normal. Mood & affect appropriate.     Data Reviewed: I have personally reviewed following labs and imaging studies  CBC: Recent Labs  Lab 12/23/21 1624 12/24/21 0315 12/25/21 0024 12/28/21 0235  WBC 7.1 5.7 5.8 7.0  NEUTROABS 5.4 3.4 3.7  --   HGB 15.0 13.2 13.8 12.8*  HCT 43.5 39.5 41.0 38.9*  MCV 100.0 102.6* 103.8* 105.1*  PLT 102* 81* 97* 123456*   Basic Metabolic Panel: Recent Labs  Lab 12/24/21 0315  12/24/21 0700 12/25/21 0024 12/26/21 1110 12/27/21 0648 12/28/21 0235  NA 141 142 140  --  139 139  K 3.6 3.3* 3.5 3.4* 3.2* 3.5  CL 110 113* 107  --  107 109  CO2 21* 22 20*  --  21* 23  GLUCOSE 122* 128* 114*  --  117* 99  BUN 15 14 14    --  12 15  CREATININE 0.71 0.55* 0.77  --  0.65 0.72  CALCIUM 8.7* 8.6* 9.0  --  9.2 8.9  MG 1.9  --  1.8 1.9 1.8 2.0  PHOS  --   --  3.0 4.0 4.0 5.0*   GFR: Estimated Creatinine Clearance: 129.9 mL/min (by C-G formula based on SCr of 0.72 mg/dL). Liver Function Tests: Recent Labs  Lab 12/23/21 1624 12/24/21 0315 12/24/21 0700 12/25/21 0024 12/25/21 0115  AST 100* 73* 63* 68* 67*  ALT 63* 48* 45* 44 44  ALKPHOS 60 47 45 45 53  BILITOT 1.8* 1.9* 2.1* 2.5* 2.7*  PROT 9.5* 7.9 7.2 7.7 7.7  ALBUMIN 5.0 4.0 3.8 4.0 4.1   No results for input(s): "LIPASE", "AMYLASE" in the last 168 hours. No results for input(s): "AMMONIA" in the last 168 hours. Coagulation Profile: No results for input(s): "INR", "PROTIME" in the last 168 hours. Cardiac Enzymes: No results for input(s): "CKTOTAL", "CKMB", "CKMBINDEX", "TROPONINI" in the last 168 hours. BNP (last 3 results) No results for input(s): "PROBNP" in the last 8760 hours. HbA1C: No results for input(s): "HGBA1C" in the last 72 hours. CBG: Recent Labs  Lab 12/26/21 1748 12/26/21 2313 12/27/21 0641 12/27/21 2321 12/28/21 0518  GLUCAP 95 108* 124* 99 100*   Lipid Profile: No results for input(s): "CHOL", "HDL", "LDLCALC", "TRIG", "CHOLHDL", "LDLDIRECT" in the last 72 hours. Thyroid Function Tests: No results for input(s): "TSH", "T4TOTAL", "FREET4", "T3FREE", "THYROIDAB" in the last 72 hours. Anemia Panel: No results for input(s): "VITAMINB12", "FOLATE", "FERRITIN", "TIBC", "IRON", "RETICCTPCT" in the last 72 hours. Sepsis Labs: Recent Labs  Lab 12/25/21 0047  LATICACIDVEN 2.1*    No results found for this or any previous visit (from the past 240 hour(s)).   Radiology Studies: No results found.  Scheduled Meds:  Chlorhexidine Gluconate Cloth  6 each Topical Daily   enoxaparin (LOVENOX) injection  40 mg Subcutaneous Q24H   folic acid  1 mg Oral Daily   metoprolol succinate  50 mg Oral Daily   multivitamin with minerals   1 tablet Oral Daily   PHENObarbital  97.5 mg Intravenous Q8H   Followed by   12/27/21 ON 12/29/2021] PHENObarbital  65 mg Intravenous Q8H   Followed by   12/31/2021 ON 12/31/2021] PHENObarbital  32.5 mg Intravenous Q8H   Continuous Infusions:  thiamine (VITAMIN B1) injection 500 mg (12/28/21 1353)     LOS: 5 days    Time spent: 50 MINS    Clemence Stillings, MD [facility]   If 7PM-7AM, please contact night-coverage

## 2021-12-28 NOTE — Progress Notes (Signed)
Occupational Therapy Treatment Patient Details Name: Lawrence Rowe MRN: [id number] DOB: [birth year] Today's Date: 12/28/2021   History of present illness presented to ER secondary to cramping, tremors and palpitations associated with ETOH withdrawal; admitted for management of ETOH withdrawal, wernicke's encephalopathy   OT comments  Mr Tiffany Kocher was seen for OT treatment on this date. Upon arrival to room pt reclined in bed, family and sitter present, agreeable to tx. Pt scored 30/30 on the Orientation Log indicating pt is fully oriented. Pt requires SUPERVISION seated grooming tasks. No physical assist for bed mobility. CGA standing from bed c rail use and functional mobility ~80 ft, max HR seen 134. Increased difficulty sequencing bed<>chair pivot t/f, MIN A to complete safely - pt attempts to complete while holding cup of coffee. Pt making good progress toward goals, will continue to follow POC. Discharge recommendation remains appropriate.     Recommendations for follow up therapy are one component of a multi-disciplinary discharge planning process, led by the attending physician.  Recommendations may be updated based on patient status, additional functional criteria and insurance authorization.    Follow Up Recommendations  Home health OT     Assistance Recommended at Discharge Intermittent Supervision/Assistance  Patient can return home with the following  A little help with walking and/or transfers;A little help with bathing/dressing/bathroom;Help with stairs or ramp for entrance   Equipment Recommendations  BSC/3in1    Recommendations for Other Services      Precautions / Restrictions Precautions Precautions: Fall Restrictions Weight Bearing Restrictions: No       Mobility Bed Mobility Overal bed mobility: Modified Independent                  Transfers Overall transfer level: Needs assistance Equipment used: None Transfers: Sit to/from Stand, Bed to  chair/wheelchair/BSC Sit to Stand: Min guard     Step pivot transfers: Min assist           Balance Overall balance assessment: Needs assistance Sitting-balance support: No upper extremity supported, Feet supported Sitting balance-Leahy Scale: Good     Standing balance support: No upper extremity supported, During functional activity Standing balance-Leahy Scale: Fair                             ADL either performed or assessed with clinical judgement   ADL Overall ADL's : Needs assistance/impaired                                       General ADL Comments: SUPERVISION seated grooming tasks. CGA functional mobility ~80 ft      Cognition Arousal/Alertness: Awake/alert Behavior During Therapy: WFL for tasks assessed/performed Overall Cognitive Status: Impaired/Different from baseline Area of Impairment: Problem solving                             Problem Solving: Slow processing General Comments: orientation log 30/30, mildly impulsive but follows all commands              General Comments max HR seen during session 134    Pertinent Vitals/ Pain       Pain Assessment Pain Assessment: No/denies pain   Frequency  Min 2X/week        Progress Toward Goals  OT Goals(current goals can now be found in the care plan  section)  Progress towards OT goals: Progressing toward goals  Acute Rehab OT Goals Patient Stated Goal: to go home OT Goal Formulation: With patient/family Time For Goal Achievement: 01/09/22 Potential to Achieve Goals: Good ADL Goals Pt Will Perform Grooming: Independently;standing Pt Will Perform Lower Body Dressing: Independently;sit to/from stand Pt Will Transfer to Toilet: Independently;ambulating;regular height toilet  Plan Discharge plan remains appropriate;Frequency remains appropriate    Co-evaluation                 AM-PAC OT "6 Clicks" Daily Activity     Outcome Measure   Help  from another person eating meals?: None Help from another person taking care of personal grooming?: A Little Help from another person toileting, which includes using toliet, bedpan, or urinal?: A Little Help from another person bathing (including washing, rinsing, drying)?: A Little Help from another person to put on and taking off regular upper body clothing?: A Little Help from another person to put on and taking off regular lower body clothing?: A Little 6 Click Score: 19    End of Session Equipment Utilized During Treatment: Gait belt  OT Visit Diagnosis: Other abnormalities of gait and mobility (R26.89);Unsteadiness on feet (R26.81)   Activity Tolerance Patient tolerated treatment well   Patient Left in chair;with call bell/phone within reach;with nursing/sitter in room   Nurse Communication          Time: 9371-6967 OT Time Calculation (min): 17 min  Charges: OT General Charges $OT Visit: 1 Visit OT Treatments $Therapeutic Activity: 8-22 mins  Kathie Dike, M.S. OTR/L  12/28/21, 2:39 PM  ascom [phone]

## 2021-12-28 NOTE — Discharge Instructions (Signed)
                  Intensive Outpatient Programs  [facility]    [facility] [address]     [address] [place],  [place]     [place], [place] [phone]      [phone]  [facility]   [facility]  (Inpatient and outpatient)  [phone] (Suboxone and Methadone) [address]           [phone]           [facility]    [facility] - Intensive Outpatient [address]     [address] [place], [place] [address]     [place], [place]  [phone]      [phone]  [facility] (Outpatient, Inpatient, [facility]  [facility] (Groups and [facility]) (insurance only) [phone]    [place], [place]          [phone]       [facility]    [facility] (for caregivers and family) [address]     [address] [place], [place]     [place], [place] [phone]      [phone]  Residential Treatment Programs  [facility]  [facility](2 years) Residential: 90 days)  [facility] [address]      [address] [place], [place]     [place], [place] [phone]      [phone] or [phone]  [facility]    [facility] [address]      [address] [place], [place]     [place], [place] [phone]      [phone]  [facility]   [facility] ([facility] [address]     [address] [place], [place] [address]     [employer] [place], [place] [phone]      [phone] Admissions: 8am-3pm M-F  [facility]: Residential Program (90 Days)              [facility]: [facility]  [place],      [place],   [phone] or [phone]    (Walk in Hours over the weekend or by referral)   [facility]: [facility]:[phone] (for crisis  response 24 hours a day) 

## 2021-12-29 DIAGNOSIS — F10931 Alcohol use, unspecified with withdrawal delirium: Secondary | ICD-10-CM | POA: Diagnosis not present

## 2021-12-29 LAB — GLUCOSE, CAPILLARY
Glucose-Capillary: 119 mg/dL — ABNORMAL HIGH (ref 70–99)
Glucose-Capillary: 135 mg/dL — ABNORMAL HIGH (ref 70–99)

## 2021-12-29 MED ORDER — LORAZEPAM 2 MG/ML IJ SOLN
2.0000 mg | Freq: Once | INTRAMUSCULAR | Status: AC
  Administered 2021-12-29: 2 mg via INTRAVENOUS
  Filled 2021-12-29: qty 1

## 2021-12-29 NOTE — TOC Progression Note (Signed)
Transition of Care [facility]) - Progression Note    Patient Details  Name: Lawrence Rowe MRN: [id number] Date of Birth: 1970/04/06  [facility] CM/SW Contact  Allayne Butcher, RN Phone Number: 12/29/2021, 2:42 PM  Clinical Narrative:    Bed search started, mother would like for patient to be able to go to [facility] but understands that might not be possible.    Planned Disposition: Skilled Nursing Facility Barriers to Discharge: Continued Medical Work up  Expected Discharge Plan and Services   Discharge Planning Services: CM Consult Post Acute Care Choice: Skilled Nursing Facility Living arrangements for the past 2 months: Single Family Home                                       Social Determinants of Health (SDOH) Interventions SDOH Screenings   Food Insecurity: No Food Insecurity (12/24/2021)  Housing: Low Risk  (12/24/2021)  Transportation Needs: No Transportation Needs (12/24/2021)  Utilities: Not At Risk (12/24/2021)  Depression (PHQ2-9): Low Risk  (04/19/2021)  Tobacco Use: Medium Risk (12/24/2021)    Readmission Risk Interventions     No data to display

## 2021-12-29 NOTE — Progress Notes (Signed)
Physical Therapy Treatment Patient Details Name: Lawrence Rowe MRN: [id number] DOB: 1970-05-15 Today's Date: 12/29/2021   History of Present Illness presented to ER secondary to cramping, tremors and palpitations associated with ETOH withdrawal; admitted for management of ETOH withdrawal, wernicke's encephalopathy    PT Comments    Pt was long sitting in bed with sitter at bedside. He is awake but only oriented x 2. Overall lacks insights of his deficits and presents with poor overall safety awareness. He was agreeable to session and cooperative. [name] MD post session about RLE skin darker than LLE after pt was up and ambulating. Pt was able to exit R side of bed, stand to RW, and tolerate ambulation ~ 200 ft. Min assist to prevent falling with vcs for posture correction and technique improvements. Pt endorses feeling weak. [name], [name] Discussed DC recommendations and concerns for him returning home alone. Pt states agreement that rehab is best option to improve strength, balance, and safety with all ADLs prior to returning home.    Recommendations for follow up therapy are one component of a multi-disciplinary discharge planning process, led by the attending physician.  Recommendations may be updated based on patient status, additional functional criteria and insurance authorization.  Follow Up Recommendations  Skilled nursing-short term rehab (<3 hours/day)     Assistance Recommended at Discharge Frequent or constant Supervision/Assistance  Patient can return home with the following A little help with walking and/or transfers;A little help with bathing/dressing/bathroom;Assistance with cooking/housework;Direct supervision/assist for medications management;Direct supervision/assist for financial management;Assist for transportation;Help with stairs or ramp for entrance   Equipment Recommendations  Rolling walker (2 wheels)        Precautions / Restrictions Precautions Precautions: Fall Restrictions Weight Bearing Restrictions: No     Mobility  Bed Mobility Overal bed mobility: Needs Assistance Bed Mobility: Supine to Sit, Sit to Supine  Supine to sit: Supervision, HOB elevated Sit to supine: Min assist General bed mobility comments: pt was able to exit R side of bed without physical assistance however tremors and slight ataxia noted at times.    Transfers Overall transfer level: Needs assistance Equipment used: Rolling walker (2 wheels) Transfers: Sit to/from Stand Sit to Stand: Min guard General transfer comment: CGA + vcs for imporved safety and to slow down. impulsivity/poor safety awareness makes him at high risk of falls.    Ambulation/Gait Ambulation/Gait assistance: Min guard, Min assist Gait Distance (Feet): 200 Feet Assistive device: Rolling walker (2 wheels) Gait Pattern/deviations: Trunk flexed, Step-through pattern, Shuffle Gait velocity: slow     General Gait Details: pt has poor gait quality with min assist to prevent falls. requires VCs for posture correction and overall improve sequencing. Pt endorses severe fatigue however vitals remained stable.    Balance Overall balance assessment: Needs assistance Sitting-balance support: No upper extremity supported, Feet supported Sitting balance-Leahy Scale: Good     Standing balance support: No upper extremity supported, During functional activity Standing balance-Leahy Scale: Fair       Cognition Arousal/Alertness: Awake/alert Behavior During Therapy: WFL for tasks assessed/performed Overall Cognitive Status: Impaired/Different from baseline Area of Impairment: Problem solving    Problem Solving: Slow processing General Comments: pt stated day of the week is monday (actually thrusday) alsio not really oriented to current situation however did admit to drinking being one of his concerns               Pertinent Vitals/Pain  Pain Assessment Pain Assessment: No/denies pain  PT Goals (current goals can now be found in the care plan section) Acute Rehab PT Goals Patient Stated Goal: "get stronger." Progress towards PT goals: Progressing toward goals    Frequency    Min 2X/week      PT Plan Current plan remains appropriate       AM-PAC PT "6 Clicks" Mobility   Outcome Measure  Help needed turning from your back to your side while in a flat bed without using bedrails?: None Help needed moving from lying on your back to sitting on the side of a flat bed without using bedrails?: A Little Help needed moving to and from a bed to a chair (including a wheelchair)?: A Little Help needed standing up from a chair using your arms (e.g., wheelchair or bedside chair)?: A Little Help needed to walk in hospital room?: A Little Help needed climbing 3-5 steps with a railing? : A Lot 6 Click Score: 18    End of Session   Activity Tolerance: Patient tolerated treatment well Patient left: in bed;with call bell/phone within reach;with family/visitor present;with nursing/sitter in room Nurse Communication: Mobility status PT Visit Diagnosis: Muscle weakness (generalized) (M62.81);Difficulty in walking, not elsewhere classified (R26.2)     Time: 1116-1140 PT Time Calculation (min) (ACUTE ONLY): 24 min  Charges:  $Gait Training: 8-22 mins $Therapeutic Activity: 8-22 mins                     Jetta Lout PTA 12/29/21, 12:17 PM

## 2021-12-29 NOTE — Progress Notes (Signed)
Pt became combative, confused. Lorazepam given per md orders. Pt also stated that he wanted to hurt himself two times. Charge nurse at bedside.

## 2021-12-29 NOTE — [place] FL2 (Signed)
[place] MEDICAID [id number] LEVEL OF CARE FORM     IDENTIFICATION  Patient Name: Lawrence Rowe Birthdate: 1970/08/06 Sex: male Admission Date (Current Location): 12/23/2021  [facility] and [place] Number:  Chiropodist and Address:  [facility], [address] [place], [place] [address]      Provider Number: [id number]  Attending Physician Name and Address:  Cipriano Bunker, MD  Relative Name and Phone Number:  Art Levan- mother- [phone]    Current Level of Care: Hospital Recommended Level of Care: Skilled Nursing Facility Prior Approval Number:    Date Approved/Denied:   PASRR Number: [id number]  Discharge Plan: SNF    Current Diagnoses: Patient Active Problem List   Diagnosis Date Noted   Hypokalemia 12/27/2021   Wernicke encephalopathy syndrome 12/27/2021   Delirium tremens (HCC) 12/27/2021   Alcoholic hepatitis 12/27/2021   Alcohol withdrawal (HCC) 12/23/2021   Transaminitis 12/23/2021   Unilateral edema of lower extremity 12/23/2021   Dermatitis 05/10/2021   Primary hypertension 05/10/2021   Tachycardia with hypertension 04/19/2021   PAD (peripheral artery disease) (HCC) 04/19/2021   Alcohol abuse 04/19/2021   Metabolic syndrome 04/19/2021    Orientation RESPIRATION BLADDER Height & Weight     Self, Time, Situation, Place  Normal External catheter Weight: 98.5 kg Height:  6' (182.9 cm)  BEHAVIORAL SYMPTOMS/MOOD NEUROLOGICAL BOWEL NUTRITION STATUS      Continent Diet (see discharge summary)  AMBULATORY STATUS COMMUNICATION OF NEEDS Skin   Limited Assist   Normal                       Personal Care Assistance Level of Assistance  Bathing, Feeding, Dressing Bathing Assistance: Limited assistance Feeding assistance: Independent Dressing Assistance: Limited assistance     Functional Limitations Info             SPECIAL CARE FACTORS FREQUENCY  PT (By licensed PT), OT (By licensed OT)     PT  Frequency: 5 times per week OT Frequency: 2-3 times per week            Contractures Contractures Info: Not present    Additional Factors Info  Code Status, Allergies Code Status Info: Full Allergies Info: PCN           Current Medications (12/29/2021):  This is the current hospital active medication list Current Facility-Administered Medications  Medication Dose Route Frequency Provider Last Rate Last Admin   acetaminophen (TYLENOL) tablet 650 mg  650 mg Oral Q6H PRN Zierle-Ghosh, Asia B, DO       Or   acetaminophen (TYLENOL) suppository 650 mg  650 mg Rectal Q6H PRN Zierle-Ghosh, Asia B, DO       Chlorhexidine Gluconate Cloth 2 % PADS 6 each  6 each Topical Daily Dorcas Carrow, MD   6 each at 12/29/21 1018   enoxaparin (LOVENOX) injection 40 mg  40 mg Subcutaneous Q24H Chawla, Harsh, MD   40 mg at 12/28/21 2105   folic acid (FOLVITE) tablet 1 mg  1 mg Oral Daily Tressie Ellis, RPH   1 mg at 12/29/21 1018   haloperidol lactate (HALDOL) injection 2 mg  2 mg Intravenous Q6H PRN Marrion Coy, MD   2 mg at 12/28/21 2147   hydrALAZINE (APRESOLINE) injection 20 mg  20 mg Intravenous Q8H PRN Chawla, Harsh, MD   20 mg at 12/27/21 0241   LORazepam (ATIVAN) injection 1 mg  1 mg Intravenous Q4H PRN Foust, Lanney Gins, NP  1 mg at 12/28/21 0340   metoprolol succinate (TOPROL-XL) 24 hr tablet 50 mg  50 mg Oral Daily Zierle-Ghosh, Asia B, DO   50 mg at 12/29/21 1018   multivitamin with minerals tablet 1 tablet  1 tablet Oral Daily Zierle-Ghosh, Asia B, DO   1 tablet at 12/29/21 1018   ondansetron (ZOFRAN) tablet 4 mg  4 mg Oral Q6H PRN Zierle-Ghosh, Asia B, DO       Or   ondansetron (ZOFRAN) injection 4 mg  4 mg Intravenous Q6H PRN Zierle-Ghosh, Asia B, DO       Oral care mouth rinse  15 mL Mouth Rinse PRN Zierle-Ghosh, Asia B, DO       oxyCODONE (Oxy IR/ROXICODONE) immediate release tablet 5 mg  5 mg Oral Q4H PRN Zierle-Ghosh, Asia B, DO   5 mg at 12/27/21 1414   PHENObarbital (LUMINAL)  injection 65 mg  65 mg Intravenous Q8H Foust, Katy L, NP       Followed by   Melene Muller ON 12/31/2021] PHENObarbital (LUMINAL) injection 32.5 mg  32.5 mg Intravenous Q8H Foust, Katy L, NP       thiamine (VITAMIN B1) 500 mg in normal saline (50 mL) IVPB  500 mg Intravenous Q8H Marrion Coy, MD 100 mL/hr at 12/29/21 0521 500 mg at 12/29/21 0626     Discharge Medications: Please see discharge summary for a list of discharge medications.  Relevant Imaging Results:  Relevant Lab Results:   Additional Information SS# [id number]  Allayne Butcher, RN

## 2021-12-29 NOTE — Progress Notes (Signed)
PROGRESS NOTE    Lawrence Rowe  WUJ:[id number] DOB: 12-17-1970 DOA: 12/23/2021 PCP: Baldwin Jamaica, MD   Brief Narrative: This 51 yrs old Male with PMH significant for alcohol abuse , Essential hypertension, presents to the ED with concern of alcohol withdrawal. Patient was transferred to ICU for increased agitation, He was placed on Precedex. Condition appears to be improving, he was able to be transferred out of ICU today. However, patient still has significant confusion, tremor, anxiety.  He has significant memory defect. He is restarted on CIWA protocol, started high-dose thiamine for Warnicke Korsakoff syndrome.  Assessment & Plan:   Principal Problem:   Alcohol withdrawal (HCC) Active Problems:   Tachycardia with hypertension   PAD (peripheral artery disease) (HCC)   Alcohol abuse   Primary hypertension   Transaminitis   Unilateral edema of lower extremity   Hypokalemia   Wernicke encephalopathy syndrome   Delirium tremens (HCC)   Alcoholic hepatitis  ETOH withdrawal with delirium tremens, POA: Wernicke's encephalopathy: Patient is currently off Precedex. Still has significant agitation and confusion. Continue one-to-one monitoring, Continue CIWA protocol. Patient's condition is more consistent with Warnicke's encephalopathy, Continue high-dose thiamine. Continue neurochecks and behavior. Patient was agitated and restless, now requiring phenobarbital taper with as needed Ativan.  Alcoholic hepatitis: Thrombocytopenia: Due to ETOH use. Continue supportive care.   Hypokalemia: Replaced.  Resolved   Essential hypertension: Continue metoprolol. Continue hydralazine as needed   DVT prophylaxis: Lovenox Code Status: Full code Family Communication: No family at bed side. Disposition Plan:   Status is: Inpatient Remains inpatient appropriate because: Acute for EtOH withdrawal with delirium tremens.  Requiring CIWA protocol now requiring phenobarbital  taper. Continue to monitor in the stepdown.  Transfer to medical floor tomorrow.  Consultants:  PCCM  Procedures: None Antimicrobials: None  Subjective: Patient was seen and examined at bedside.  Overnight events noted.   Patient still has significant tremors, tremulousness, denies any chest pain or shortness of breath.  Objective: Vitals:   12/29/21 0500 12/29/21 0600 12/29/21 0700 12/29/21 0800  BP:  125/80  (!) 141/94  Pulse:   94   Resp: 15 15 14 12   Temp:      TempSrc:      SpO2:   97%   Weight:      Height:        Intake/Output Summary (Last 24 hours) at 12/29/2021 1255 Last data filed at 12/29/2021 1043 Gross per 24 hour  Intake 450 ml  Output 3100 ml  Net -2650 ml   Filed Weights   12/23/21 1611 12/24/21 2340  Weight: 90.7 kg 98.5 kg    Examination:  General exam: Appears comfortable, not in any acute distress.  Deconditioned. Respiratory system: CTA bilaterally, respiratory effort normal, RR 15. Cardiovascular system: S1 & S2 heard, regular rate and rhythm, no murmur. Gastrointestinal system: Abdomen is soft, non tender, non distended, BS+ Central nervous system: Alert and oriented X 3. No focal neurological deficits. Extremities: No edema, no cyanosis, no clubbing. Skin: No rashes, lesions or ulcers Psychiatry: Judgement and insight appear normal. Mood & affect appropriate.     Data Reviewed: I have personally reviewed following labs and imaging studies  CBC: Recent Labs  Lab 12/23/21 1624 12/24/21 0315 12/25/21 0024 12/28/21 0235  WBC 7.1 5.7 5.8 7.0  NEUTROABS 5.4 3.4 3.7  --   HGB 15.0 13.2 13.8 12.8*  HCT 43.5 39.5 41.0 38.9*  MCV 100.0 102.6* 103.8* 105.1*  PLT 102* 81* 97* 113*   Basic Metabolic  Panel: Recent Labs  Lab 12/24/21 0315 12/24/21 0700 12/25/21 0024 12/26/21 1110 12/27/21 0648 12/28/21 0235  NA 141 142 140  --  139 139  K 3.6 3.3* 3.5 3.4* 3.2* 3.5  CL 110 113* 107  --  107 109  CO2 21* 22 20*  --  21* 23   GLUCOSE 122* 128* 114*  --  117* 99  BUN 15 14 14   --  12 15  CREATININE 0.71 0.55* 0.77  --  0.65 0.72  CALCIUM 8.7* 8.6* 9.0  --  9.2 8.9  MG 1.9  --  1.8 1.9 1.8 2.0  PHOS  --   --  3.0 4.0 4.0 5.0*   GFR: Estimated Creatinine Clearance: 129.9 mL/min (by C-G formula based on SCr of 0.72 mg/dL). Liver Function Tests: Recent Labs  Lab 12/23/21 1624 12/24/21 0315 12/24/21 0700 12/25/21 0024 12/25/21 0115  AST 100* 73* 63* 68* 67*  ALT 63* 48* 45* 44 44  ALKPHOS 60 47 45 45 53  BILITOT 1.8* 1.9* 2.1* 2.5* 2.7*  PROT 9.5* 7.9 7.2 7.7 7.7  ALBUMIN 5.0 4.0 3.8 4.0 4.1   No results for input(s): "LIPASE", "AMYLASE" in the last 168 hours. No results for input(s): "AMMONIA" in the last 168 hours. Coagulation Profile: No results for input(s): "INR", "PROTIME" in the last 168 hours. Cardiac Enzymes: No results for input(s): "CKTOTAL", "CKMB", "CKMBINDEX", "TROPONINI" in the last 168 hours. BNP (last 3 results) No results for input(s): "PROBNP" in the last 8760 hours. HbA1C: No results for input(s): "HGBA1C" in the last 72 hours. CBG: Recent Labs  Lab 12/28/21 0518 12/28/21 1736 12/28/21 2346 12/29/21 0525 12/29/21 1143  GLUCAP 100* 104* 98 119* 135*   Lipid Profile: No results for input(s): "CHOL", "HDL", "LDLCALC", "TRIG", "CHOLHDL", "LDLDIRECT" in the last 72 hours. Thyroid Function Tests: No results for input(s): "TSH", "T4TOTAL", "FREET4", "T3FREE", "THYROIDAB" in the last 72 hours. Anemia Panel: No results for input(s): "VITAMINB12", "FOLATE", "FERRITIN", "TIBC", "IRON", "RETICCTPCT" in the last 72 hours. Sepsis Labs: Recent Labs  Lab 12/25/21 0047  LATICACIDVEN 2.1*    No results found for this or any previous visit (from the past 240 hour(s)).   Radiology Studies: No results found.  Scheduled Meds:  Chlorhexidine Gluconate Cloth  6 each Topical Daily   enoxaparin (LOVENOX) injection  40 mg Subcutaneous [id number]   folic acid  1 mg Oral Daily   metoprolol  succinate  50 mg Oral Daily   multivitamin with minerals  1 tablet Oral Daily   PHENObarbital  97.5 mg Intravenous Q8H   Followed by   PHENObarbital  65 mg Intravenous Q8H   Followed by   Derrill Memo ON 12/31/2021] PHENObarbital  32.5 mg Intravenous Q8H   Continuous Infusions:  thiamine (VITAMIN B1) injection 500 mg (12/29/21 0521)     LOS: 6 days    Time spent: [place], MD [facility]   If 7PM-7AM, please contact night-coverage

## 2021-12-30 DIAGNOSIS — F10231 Alcohol dependence with withdrawal delirium: Principal | ICD-10-CM

## 2021-12-30 DIAGNOSIS — F10931 Alcohol use, unspecified with withdrawal delirium: Secondary | ICD-10-CM | POA: Diagnosis not present

## 2021-12-30 LAB — GLUCOSE, CAPILLARY
Glucose-Capillary: 135 mg/dL — ABNORMAL HIGH (ref 70–99)
Glucose-Capillary: 106 mg/dL — ABNORMAL HIGH (ref 70–99)
Glucose-Capillary: 125 mg/dL — ABNORMAL HIGH (ref 70–99)
Glucose-Capillary: 170 mg/dL — ABNORMAL HIGH (ref 70–99)

## 2021-12-30 MED ORDER — PHENOBARBITAL SODIUM 65 MG/ML IJ SOLN
65.0000 mg | Freq: Three times a day (TID) | INTRAMUSCULAR | Status: DC
  Administered 2021-12-30 – 2021-12-31 (×2): 65 mg via INTRAVENOUS
  Filled 2021-12-30 (×2): qty 1

## 2021-12-30 MED ORDER — PHENOBARBITAL SODIUM 65 MG/ML IJ SOLN: 32.5000 mg | Freq: Three times a day (TID) | INTRAMUSCULAR | Status: DC

## 2021-12-30 NOTE — Progress Notes (Signed)
PROGRESS NOTE    Lawrence Rowe  PYP:[id number] DOB: 01-31-1970 DOA: 12/23/2021 PCP: Baldwin Jamaica, MD   Brief Narrative: This 51 yrs old Male with PMH significant for alcohol abuse , Essential hypertension, presents to the ED with concern of alcohol withdrawal. Patient was transferred to ICU for increased agitation, He was placed on Precedex. Condition appears to be improving, he was able to be transferred out of ICU today. However, patient still has significant confusion, tremor, anxiety.  He has significant memory defect. He is restarted on CIWA protocol, started high-dose thiamine for Warnicke Korsakoff syndrome.  Assessment & Plan:   Principal Problem:   Alcohol withdrawal (HCC) Active Problems:   Tachycardia with hypertension   PAD (peripheral artery disease) (HCC)   Alcohol abuse   Primary hypertension   Transaminitis   Unilateral edema of lower extremity   Hypokalemia   Wernicke encephalopathy syndrome   Delirium tremens (HCC)   Alcoholic hepatitis  ETOH withdrawal with delirium tremens, POA: Wernicke's encephalopathy: Patient is currently off Precedex. Still has significant agitation and confusion. Continue one-to-one monitoring, Continue CIWA protocol. Patient's condition is more consistent with Warnicke's encephalopathy, Continue high-dose thiamine. Continue neurochecks and behavior. Patient was agitated and restless, now requiring phenobarbital taper with as needed Ativan. Patient seems much improved today.  Transfer out of ICU to PCU  Alcoholic hepatitis: Thrombocytopenia: Due to ETOH use. Continue supportive care.   Hypokalemia: Replaced.  Resolved   Essential hypertension: Continue metoprolol. Continue hydralazine as needed   DVT prophylaxis: Lovenox Code Status: Full code Family Communication: Friend at bed side. Disposition Plan:   Status is: Inpatient Remains inpatient appropriate because: Admitted for EtOH withdrawal with delirium  tremens.  Requiring CIWA protocol now requiring phenobarbital taper. Transfer to PCU.  Consultants:  PCCM  Procedures: None Antimicrobials: None  Subjective: Patient was seen and examined at bedside.  Overnight events noted.   Patient still has tremulousness, denies any chest pain or shortness of breath.  Objective: Vitals:   12/30/21 0900 12/30/21 1000 12/30/21 1100 12/30/21 1200  BP:  117/79  130/86  Pulse: 84 79 79 73  Resp: 16 14 15 16   Temp:      TempSrc:  Oral    SpO2: 96% 100% 97% 100%  Weight:      Height:       No intake or output data in the 24 hours ending 12/30/21 1508  Filed Weights   12/23/21 1611 12/24/21 2340  Weight: 90.7 kg 98.5 kg    Examination:  General exam: Appears comfortable, not in any acute distress.  Deconditioned. Respiratory system: CTA bilaterally, respiratory effort normal, RR 15. Cardiovascular system: S1 & S2 heard, regular rate and rhythm, no murmur. Gastrointestinal system: Abdomen is soft, non tender, non distended, BS+ Central nervous system: Alert and oriented X 3. No focal neurological deficits. Extremities: No edema, no cyanosis, no clubbing. Skin: No rashes, lesions or ulcers Psychiatry: Judgement and insight appear normal. Mood & affect appropriate.     Data Reviewed: I have personally reviewed following labs and imaging studies  CBC: Recent Labs  Lab 12/23/21 1624 12/24/21 0315 12/25/21 0024 12/28/21 0235  WBC 7.1 5.7 5.8 7.0  NEUTROABS 5.4 3.4 3.7  --   HGB 15.0 13.2 13.8 12.8*  HCT 43.5 39.5 41.0 38.9*  MCV 100.0 102.6* 103.8* 105.1*  PLT 102* 81* 97* 113*   Basic Metabolic Panel: Recent Labs  Lab 12/24/21 0315 12/24/21 0700 12/25/21 0024 12/26/21 1110 12/27/21 0648 12/28/21 0235  NA 141 142  140  --  139 139  K 3.6 3.3* 3.5 3.4* 3.2* 3.5  CL 110 113* 107  --  107 109  CO2 21* 22 20*  --  21* 23  GLUCOSE 122* 128* 114*  --  117* 99  BUN 15 14 14   --  12 15  CREATININE 0.71 0.55* 0.77  --  0.65  0.72  CALCIUM 8.7* 8.6* 9.0  --  9.2 8.9  MG 1.9  --  1.8 1.9 1.8 2.0  PHOS  --   --  3.0 4.0 4.0 5.0*   GFR: Estimated Creatinine Clearance: 129.9 mL/min (by C-G formula based on SCr of 0.72 mg/dL). Liver Function Tests: Recent Labs  Lab 12/23/21 1624 12/24/21 0315 12/24/21 0700 12/25/21 0024 12/25/21 0115  AST 100* 73* 63* 68* 67*  ALT 63* 48* 45* 44 44  ALKPHOS 60 47 45 45 53  BILITOT 1.8* 1.9* 2.1* 2.5* 2.7*  PROT 9.5* 7.9 7.2 7.7 7.7  ALBUMIN 5.0 4.0 3.8 4.0 4.1   No results for input(s): "LIPASE", "AMYLASE" in the last 168 hours. No results for input(s): "AMMONIA" in the last 168 hours. Coagulation Profile: No results for input(s): "INR", "PROTIME" in the last 168 hours. Cardiac Enzymes: No results for input(s): "CKTOTAL", "CKMB", "CKMBINDEX", "TROPONINI" in the last 168 hours. BNP (last 3 results) No results for input(s): "PROBNP" in the last 8760 hours. HbA1C: No results for input(s): "HGBA1C" in the last 72 hours. CBG: Recent Labs  Lab 12/29/21 0525 12/29/21 1143 12/29/21 2342 12/30/21 0526 12/30/21 1124  GLUCAP 119* 135* 135* 106* 125*   Lipid Profile: No results for input(s): "CHOL", "HDL", "LDLCALC", "TRIG", "CHOLHDL", "LDLDIRECT" in the last 72 hours. Thyroid Function Tests: No results for input(s): "TSH", "T4TOTAL", "FREET4", "T3FREE", "THYROIDAB" in the last 72 hours. Anemia Panel: No results for input(s): "VITAMINB12", "FOLATE", "FERRITIN", "TIBC", "IRON", "RETICCTPCT" in the last 72 hours. Sepsis Labs: Recent Labs  Lab 12/25/21 0047  LATICACIDVEN 2.1*    No results found for this or any previous visit (from the past 240 hour(s)).   Radiology Studies: No results found.  Scheduled Meds:  Chlorhexidine Gluconate Cloth  6 each Topical Daily   enoxaparin (LOVENOX) injection  40 mg Subcutaneous Q24H   folic acid  1 mg Oral Daily   metoprolol succinate  50 mg Oral Daily   multivitamin with minerals  1 tablet Oral Daily   PHENObarbital  65 mg  Intravenous Q8H   Followed by   12/27/21 ON 12/31/2021] PHENObarbital  32.5 mg Intravenous Q8H   Continuous Infusions:     LOS: 7 days    Time spent: 35 MINS    Jamie Belger, MD [facility]   If 7PM-7AM, please contact night-coverage

## 2021-12-30 NOTE — Progress Notes (Signed)
Occupational Therapy Treatment Patient Details Name: Lawrence Rowe MRN: [id number] DOB: 03/17/1970 Today's Date: 12/30/2021   History of present illness presented to ER secondary to cramping, tremors and palpitations associated with ETOH withdrawal; admitted for management of ETOH withdrawal, wernicke's encephalopathy   OT comments  [name] was seen for OT treatment on this date. Upon arrival to room pt seated EOB, agreeable to tx. Pt requires SBA standing grooming tasks, good standing tolerance. CGA functional mobility ~350 ft with 1 standing rest break, mild balance deficits noted with head turns and turning corners, self corrects. Educated on importance of wearing shoes with heel support (slides noted in room) for falls prevention. Pt making good progress toward goals, will continue to follow POC. Discharge recommendation remains appropriate.     Recommendations for follow up therapy are one component of a multi-disciplinary discharge planning process, led by the attending physician.  Recommendations may be updated based on patient status, additional functional criteria and insurance authorization.    Follow Up Recommendations  Home health OT     Assistance Recommended at Discharge Intermittent Supervision/Assistance  Patient can return home with the following  A little help with walking and/or transfers;A little help with bathing/dressing/bathroom;Help with stairs or ramp for entrance   Equipment Recommendations  BSC/3in1    Recommendations for Other Services      Precautions / Restrictions Precautions Precautions: Fall Restrictions Weight Bearing Restrictions: No       Mobility Bed Mobility Overal bed mobility: Independent                  Transfers Overall transfer level: Needs assistance Equipment used: None Transfers: Sit to/from Stand Sit to Stand: Supervision                 Balance Overall balance assessment: Needs  assistance Sitting-balance support: No upper extremity supported, Feet supported Sitting balance-Leahy Scale: Normal     Standing balance support: No upper extremity supported, During functional activity Standing balance-Leahy Scale: Good                             ADL either performed or assessed with clinical judgement   ADL Overall ADL's : Needs assistance/impaired                                       General ADL Comments: SBA standing grooming tasks, good standing tolerance. CGA functional mobility ~350 ft with 1 standing rest break.      Cognition Arousal/Alertness: Awake/alert Behavior During Therapy: WFL for tasks assessed/performed Overall Cognitive Status: Within Functional Limits for tasks assessed                                                     Pertinent Vitals/ Pain       Pain Assessment Pain Assessment: No/denies pain   Frequency  Min 2X/week        Progress Toward Goals  OT Goals(current goals can now be found in the care plan section)  Progress towards OT goals: Progressing toward goals  Acute Rehab OT Goals Patient Stated Goal: to go home OT Goal Formulation: With patient/family Time For Goal Achievement: 01/09/22 Potential to Achieve Goals: Good ADL  Goals Pt Will Perform Grooming: Independently;standing Pt Will Perform Lower Body Dressing: Independently;sit to/from stand Pt Will Transfer to Toilet: Independently;ambulating;regular height toilet  Plan Discharge plan remains appropriate;Frequency remains appropriate    Co-evaluation                 AM-PAC OT "6 Clicks" Daily Activity     Outcome Measure   Help from another person eating meals?: None Help from another person taking care of personal grooming?: A Little Help from another person toileting, which includes using toliet, bedpan, or urinal?: A Little Help from another person bathing (including washing, rinsing,  drying)?: A Little Help from another person to put on and taking off regular upper body clothing?: A Little Help from another person to put on and taking off regular lower body clothing?: A Little 6 Click Score: 19    End of Session    OT Visit Diagnosis: Other abnormalities of gait and mobility (R26.89);Unsteadiness on feet (R26.81)   Activity Tolerance Patient tolerated treatment well   Patient Left in bed;with call bell/phone within reach;with nursing/sitter in room   Nurse Communication Mobility status        Time: 5631-4970 OT Time Calculation (min): 16 min  Charges: OT General Charges $OT Visit: 1 Visit OT Treatments $Therapeutic Activity: 8-22 mins  Kathie Dike, M.S. OTR/L  12/30/21, 3:51 PM  ascom [phone]

## 2021-12-30 NOTE — Progress Notes (Signed)
PT Cancellation Note  Patient Details Name: Lawrence Rowe MRN: [id number] DOB: [birth year]   Cancelled Treatment:     PT attempt. Pt requested author return in morning to do stair since was recently up with OT. Per discussion with OT, pt is progressing well and is planning to DC home now. Author will return in the morning as requested and most likely will update DC recs to home with HHPT.    Rushie Chestnut 12/30/2021, 4:07 PM

## 2021-12-30 NOTE — Progress Notes (Signed)
Pt refuses to wear telemetry. Confused at times and combative. Explained the importance of monitoring and pt continues to remove leads.

## 2021-12-31 DIAGNOSIS — F10931 Alcohol use, unspecified with withdrawal delirium: Secondary | ICD-10-CM | POA: Diagnosis not present

## 2021-12-31 DIAGNOSIS — R000 Tachycardia, unspecified: Secondary | ICD-10-CM

## 2021-12-31 LAB — COMPREHENSIVE METABOLIC PANEL WITH GFR
ALT: 69 U/L — ABNORMAL HIGH (ref 0–44)
AST: 67 U/L — ABNORMAL HIGH (ref 15–41)
Albumin: 3.8 g/dL (ref 3.5–5.0)
Alkaline Phosphatase: 53 U/L (ref 38–126)
Anion gap: 7 (ref 5–15)
BUN: 13 mg/dL (ref 6–20)
CO2: 26 mmol/L (ref 22–32)
Calcium: 8.8 mg/dL — ABNORMAL LOW (ref 8.9–10.3)
Chloride: 105 mmol/L (ref 98–111)
Creatinine, Ser: 0.76 mg/dL (ref 0.61–1.24)
GFR, Estimated: >60 mL/min
Glucose, Bld: 107 mg/dL — ABNORMAL HIGH (ref 70–99)
Potassium: 3.8 mmol/L (ref 3.5–5.1)
Sodium: 138 mmol/L (ref 135–145)
Total Bilirubin: 1.2 mg/dL (ref 0.3–1.2)
Total Protein: 7.6 g/dL (ref 6.5–8.1)

## 2021-12-31 LAB — CBC
HCT: 41.4 % (ref 39.0–52.0)
Hemoglobin: 13.7 g/dL (ref 13.0–17.0)
MCH: 34.3 pg — ABNORMAL HIGH (ref 26.0–34.0)
MCHC: 33.1 g/dL (ref 30.0–36.0)
MCV: 103.8 fL — ABNORMAL HIGH (ref 80.0–100.0)
Platelets: 155 K/uL (ref 150–400)
RBC: 3.99 MIL/uL — ABNORMAL LOW (ref 4.22–5.81)
RDW: 13.6 % (ref 11.5–15.5)
WBC: 6.7 K/uL (ref 4.0–10.5)
nRBC: 0 % (ref 0.0–0.2)

## 2021-12-31 LAB — PHOSPHORUS: Phosphorus: 5.3 mg/dL — ABNORMAL HIGH (ref 2.5–4.6)

## 2021-12-31 LAB — GLUCOSE, CAPILLARY
Glucose-Capillary: 106 mg/dL — ABNORMAL HIGH (ref 70–99)
Glucose-Capillary: 117 mg/dL — ABNORMAL HIGH (ref 70–99)

## 2021-12-31 LAB — MAGNESIUM: Magnesium: 2.1 mg/dL (ref 1.7–2.4)

## 2021-12-31 MED ORDER — FOLIC ACID 1 MG PO TABS: 1.0000 mg | ORAL_TABLET | Freq: Every day | ORAL | 1 refills | Status: DC

## 2021-12-31 MED ORDER — THIAMINE HCL 100 MG PO TABS: 100.0000 mg | ORAL_TABLET | Freq: Every day | ORAL | 1 refills | Status: DC

## 2021-12-31 NOTE — Progress Notes (Signed)
Patient taken off the floor via wheelchair, escorted home via [place] driver. No distress noted

## 2021-12-31 NOTE — TOC Progression Note (Signed)
Transition of Care [facility]) - Progression Note    Patient Details  Name: Lawrence Rowe MRN: [id number] Date of Birth: [birth year]  [facility] CM/SW Contact  Kemper Durie, RN Phone Number: 12/31/2021, 10:51 AM  Clinical Narrative:     PT recommendations changed to home with HHPT/OT.  MD also requesting resources for ETOH abuse and residential facilities.  Call placed to sister and mother per their request, no answer, message left.  Spoke with patient at the bedside, family not present, but friend was.  He granted permission to speak freely in front of friend.  Discussed recommendations for HHPT/OT, he declines stating he has equipment at home to get stronger and was told by inpatient PT to "walk."  Discussed the possibility of going to outpatient rehab, he again declines.  Presented patient with ETOH resources, to include AA meetings and residential facilities, he declined stating he will work on recovery on his own. TOC remains available if needed.   Expected Discharge Plan: Skilled Nursing Facility Barriers to Discharge: Continued Medical Work up  Expected Discharge Plan and Services   Discharge Planning Services: CM Consult Post Acute Care Choice: Skilled Nursing Facility Living arrangements for the past 2 months: Single Family Home                                       Social Determinants of Health (SDOH) Interventions SDOH Screenings   Food Insecurity: No Food Insecurity (12/24/2021)  Housing: Low Risk  (12/24/2021)  Transportation Needs: No Transportation Needs (12/24/2021)  Utilities: Not At Risk (12/24/2021)  Depression (PHQ2-9): Low Risk  (04/19/2021)  Tobacco Use: Medium Risk (12/24/2021)    Readmission Risk Interventions     No data to display

## 2021-12-31 NOTE — Progress Notes (Signed)
Physical Therapy Treatment Patient Details Name: Lawrence Rowe MRN: [id number] DOB: [birth year] Today's Date: 12/31/2021   History of Present Illness presented to ER secondary to cramping, tremors and palpitations associated with ETOH withdrawal; admitted for management of ETOH withdrawal, wernicke's encephalopathy    PT Comments    Pt was alert sitting EOB upon arrival. He agrees to session and was cooperative throughout." I hope I get to go home today." Pt does demonstrate overall much improved safety with transfers and gait. Safely ambulated hallways and perform going up/down 14 stair without difficulty. Some unsteadiness noted however no intervention required to prevent fall. Author did recommend pt use RW at DC for additional safety until his strength and balance returns to PLOF. Recommend HHPT to follow to progress his strength and balance. Pt had visitor arrive at conclusion of session who voiced concerns with pt Dcing home. He requested a Child psychotherapist consult to assist with lining up resources at DC. RN/MD made aware.    Recommendations for follow up therapy are one component of a multi-disciplinary discharge planning process, led by the attending physician.  Recommendations may be updated based on patient status, additional functional criteria and insurance authorization.  Follow Up Recommendations  Home health PT     Assistance Recommended at Discharge PRN  Patient can return home with the following A little help with walking and/or transfers;A little help with bathing/dressing/bathroom;Assistance with cooking/housework;Direct supervision/assist for medications management;Direct supervision/assist for financial management;Assist for transportation;Help with stairs or ramp for entrance   Equipment Recommendations  Other (comment) (pt states he has access to RW at home already)    Recommendations for Other Services       Precautions / Restrictions  Precautions Precautions: Fall Restrictions Weight Bearing Restrictions: No     Mobility  Bed Mobility Overal bed mobility: Independent   Transfers Overall transfer level: Modified independent Equipment used: None Transfers: Sit to/from Stand Sit to Stand: Modified independent (Device/Increase time)  General transfer comment: Pt was able to stand without physical assistance for Vcs. no AD used    Ambulation/Gait Ambulation/Gait assistance: Supervision Gait Distance (Feet): 200 Feet Assistive device: None Gait Pattern/deviations: Step-through pattern Gait velocity: WNL     General Gait Details: pt ambulated > 200 ft without AD. author did recommend pt use AD until balance and strength returns to PLOF for additional safety at DC. pt states understanding   Stairs Stairs: Yes Stairs assistance: Supervision Stair Management: One rail Left, Alternating pattern Number of Stairs: 14 General stair comments: Pt demonstrated safe ability to ascend/descend 14 stairs without physical assistance.    Balance Overall balance assessment: Needs assistance Sitting-balance support: No upper extremity supported, Feet supported Sitting balance-Leahy Scale: Normal     Standing balance support: No upper extremity supported, During functional activity Standing balance-Leahy Scale: Good Standing balance comment: did recommend use of AD at DC until balance is back to baseline      Cognition Arousal/Alertness: Awake/alert Behavior During Therapy: WFL for tasks assessed/performed Overall Cognitive Status: Within Functional Limits for tasks assessed    General Comments: Pt demonstrated much imporved cognition versus to days prior. answer and asked appropriate questions correctly.               Pertinent Vitals/Pain Pain Assessment Pain Assessment: No/denies pain     PT Goals (current goals can now be found in the care plan section) Acute Rehab PT Goals Patient Stated Goal: " I want  to go home." Progress towards PT goals: Progressing toward goals  Frequency    Min 2X/week      PT Plan Current plan remains appropriate       AM-PAC PT "6 Clicks" Mobility   Outcome Measure  Help needed turning from your back to your side while in a flat bed without using bedrails?: None Help needed moving from lying on your back to sitting on the side of a flat bed without using bedrails?: A Little Help needed moving to and from a bed to a chair (including a wheelchair)?: A Little Help needed standing up from a chair using your arms (e.g., wheelchair or bedside chair)?: A Little Help needed to walk in hospital room?: A Little Help needed climbing 3-5 steps with a railing? : A Little 6 Click Score: 19    End of Session   Activity Tolerance: Patient tolerated treatment well Patient left: in chair;with call bell/phone within reach;with chair alarm set;with family/visitor present Nurse Communication: Mobility status PT Visit Diagnosis: Muscle weakness (generalized) (M62.81);Difficulty in walking, not elsewhere classified (R26.2)     Time: 0539-7673 PT Time Calculation (min) (ACUTE ONLY): 17 min  Charges:  $Gait Training: 8-22 mins                     Jetta Lout PTA 12/31/21, 9:07 AM

## 2021-12-31 NOTE — Progress Notes (Signed)
[name] to be D/C'd Home per MD order.  Discussed prescriptions and follow up appointments with the patient. Prescription electronically submitted, medication list explained in detail. Pt verbalized understanding.  Allergies as of 12/31/2021       Reactions   Penicillins Other (See Comments)        Medication List     TAKE these medications    folic acid 1 MG tablet Commonly known as: FOLVITE Take 1 tablet (1 mg total) by mouth daily. Start taking on: January 01, 2022   metoprolol succinate 50 MG 24 hr tablet Commonly known as: TOPROL-XL Take 1 tablet (50 mg total) by mouth daily. Take with or immediately following a meal.   thiamine 100 MG tablet Commonly known as: VITAMIN B1 Take 1 tablet (100 mg total) by mouth daily.        Vitals:   12/31/21 0339 12/31/21 0739  BP: 109/68 122/88  Pulse: 63 82  Resp: 18 18  Temp: 97.6 F (36.4 C) 98.3 F (36.8 C)  SpO2: 98% 98%    Skin clean, dry and intact without evidence of skin break down, no evidence of skin tears noted. IV catheter discontinued intact. Site without signs and symptoms of complications. Dressing and pressure applied. Pt denies pain at this time. No complaints noted.  An After Visit Summary was printed and given to the patient. Patient waiting on ride to be discharged home  Lawrence Rowe

## 2021-12-31 NOTE — Discharge Summary (Signed)
Physician Discharge Summary  Lawrence Rowe PFX:[id number] DOB: 1970-10-31 DOA: 12/23/2021  PCP: Baldwin Jamaica, MD  Admit date: 12/23/2021  Discharge date: 12/31/2021  Admitted From: Home.  Disposition:  Home Health Services  Recommendations for Outpatient Follow-up:  Follow up with PCP in 1-2 weeks. Please obtain BMP/CBC in one week. Patient was provided resources for detox from alcohol. Home health services been arranged.  Home Health:Home PT/OT Equipment/Devices:None  Discharge Condition: Stable CODE STATUS:Full code Diet recommendation: Heart Healthy   Brief [facility] Course: This 51 yrs old Male with PMH significant for alcohol abuse , Essential hypertension, presents to the ED with concerns of alcohol withdrawal. Patient was transferred to ICU for increased agitation, He was placed on Precedex. Condition has improved, he was able to be transferred out of ICU. However, patient still has significant confusion, tremor, anxiety.  He has significant memory defect. He is restarted on CIWA protocol, started high-dose thiamine for Warnicke Korsakoff syndrome. Patient has intermittent periods of agitation , required phenobarbitol tapering.  Patient was evaluated by psychiatry recommended to continue CIWA protocol.  Patient was very deconditioned , has unsteadiness, PT and OT recommended home health services.  Patient want to be discharged.  Family is concerned that if patient is discharged home,  he will continue drinking alcohol.  They wanted patient to go to rehab, social worker has long discussion with family and Patient. Patient refuses detox rehab.  He also declined home health PT and OT.  Patient is fully awake and oriented x 3.  Resources provided.  Patient is being discharged home.  Discharge Diagnoses:  Principal Problem:   Alcohol withdrawal (HCC) Active Problems:   Tachycardia with hypertension   PAD (peripheral artery disease) (HCC)   Alcohol abuse   Primary  hypertension   Transaminitis   Unilateral edema of lower extremity   Hypokalemia   Wernicke encephalopathy syndrome   Delirium tremens (HCC)   Alcoholic hepatitis  ETOH withdrawal with delirium tremens, POA: Wernicke's encephalopathy: Patient is currently off Precedex. Still has significant agitation and confusion. Continued on one-to-one monitoring, Continue CIWA protocol. Patient's condition is more consistent with Warnicke's encephalopathy, Continue high-dose thiamine. Continue neurochecks and behavior. Patient was agitated and restless, now requiring phenobarbital taper with as needed Ativan. Patient seems much improved today.   Fully awake and oriented x 3 wants to be discharged.  Patient is being discharged home.   Alcoholic hepatitis: Thrombocytopenia: Due to ETOH use. Continue supportive care.   Hypokalemia: Replaced.  Resolved   Essential hypertension: Continue metoprolol. Continue hydralazine as needed  Discharge Instructions  Discharge Instructions     Call MD for:  difficulty breathing, headache or visual disturbances   Complete by: As directed    Call MD for:  persistant dizziness or light-headedness   Complete by: As directed    Call MD for:  persistant nausea and vomiting   Complete by: As directed    Diet - low sodium heart healthy   Complete by: As directed    Diet Carb Modified   Complete by: As directed    Discharge instructions   Complete by: As directed    Advised to follow-up with primary care physician in 1 week. Patient was provided resources for detox from alcohol. Home health services been arranged.   Increase activity slowly   Complete by: As directed       Allergies as of 12/31/2021       Reactions   Penicillins Other (See Comments)  Medication List     TAKE these medications    folic acid 1 MG tablet Commonly known as: FOLVITE Take 1 tablet (1 mg total) by mouth daily. Start taking on: January 01, 2022    metoprolol succinate 50 MG 24 hr tablet Commonly known as: TOPROL-XL Take 1 tablet (50 mg total) by mouth daily. Take with or immediately following a meal.   thiamine 100 MG tablet Commonly known as: VITAMIN B1 Take 1 tablet (100 mg total) by mouth daily.        Follow-up Information     Baldwin Jamaica, MD Follow up in 1 week(s).   Specialty: Internal Medicine Contact information: [address] [place] [address] [phone]                Allergies  Allergen Reactions   Penicillins Other (See Comments)    Consultations: Psychiatry   Procedures/Studies: US Venous Img Lower Unilateral Right  Result Date: 12/23/2021 CLINICAL DATA:  Leg swelling and discoloration EXAM: RIGHT LOWER EXTREMITY VENOUS DOPPLER ULTRASOUND TECHNIQUE: Gray-scale sonography with compression, as well as color and duplex ultrasound, were performed to evaluate the deep venous system(s) from the level of the common femoral vein through the popliteal and proximal calf veins. COMPARISON:  09/15/2021 FINDINGS: VENOUS Normal compressibility of the common femoral, superficial femoral, and popliteal veins, as well as the visualized calf veins. Visualized portions of profunda femoral vein and great saphenous vein unremarkable. No filling defects to suggest DVT on grayscale or color Doppler imaging. Doppler waveforms show normal direction of venous flow, normal respiratory plasticity and response to augmentation. Limited views of the contralateral common femoral vein are unremarkable. OTHER None. Limitations: none IMPRESSION: Negative. Electronically Signed   By: Charline Bills M.D.   On: 12/23/2021 20:08   DG Chest Portable 1 View  Result Date: 12/23/2021 CLINICAL DATA:  Tachycardia, ETOH withdrawal EXAM: PORTABLE CHEST 1 VIEW COMPARISON:  None Available. FINDINGS: Lungs are clear.  No pleural effusion or pneumothorax. The heart is normal in size. IMPRESSION: No evidence of acute cardiopulmonary  disease. Electronically Signed   By: Charline Bills M.D.   On: 12/23/2021 19:37      Subjective: Patient was seen and examined at bedside. Overnight events noted.   Patient reports doing much better today,  fully awake and oriented and wants to be discharged.   He stated he will not drink alcohol again.  Resources provided.  Discharge Exam: Vitals:   12/31/21 0339 12/31/21 0739  BP: 109/68 122/88  Pulse: 63 82  Resp: 18 18  Temp: 97.6 F (36.4 C) 98.3 F (36.8 C)  SpO2: 98% 98%   Vitals:   12/30/21 2130 12/31/21 0049 12/31/21 0339 12/31/21 0739  BP: 133/77 (!) 140/96 109/68 122/88  Pulse: 74 73 63 82  Resp: 17 20 18 18   Temp: 97.7 F (36.5 C) 97.6 F (36.4 C) 97.6 F (36.4 C) 98.3 F (36.8 C)  TempSrc: Oral Oral Oral   SpO2: 98% 100% 98% 98%  Weight:      Height:        General: Pt is alert, awake, not in acute distress Cardiovascular: RRR, S1/S2 +, no rubs, no gallops Respiratory: CTA bilaterally, no wheezing, no rhonchi Abdominal: Soft, NT, ND, bowel sounds + Extremities: no edema, no cyanosis    The results of significant diagnostics from this hospitalization (including imaging, microbiology, ancillary and laboratory) are listed below for reference.     Microbiology: No results found for this or any previous  visit (from the past 240 hour(s)).   Labs: BNP (last 3 results) No results for input(s): "BNP" in the last 8760 hours. Basic Metabolic Panel: Recent Labs  Lab 12/25/21 0024 12/26/21 1110 12/27/21 0648 12/28/21 0235 12/31/21 0522  NA 140  --  139 139 138  K 3.5 3.4* 3.2* 3.5 3.8  CL 107  --  107 109 105  CO2 20*  --  21* 23 26  GLUCOSE 114*  --  117* 99 107*  BUN 14  --  12 15 13   CREATININE 0.77  --  0.65 0.72 0.76  CALCIUM 9.0  --  9.2 8.9 8.8*  MG 1.8 1.9 1.8 2.0 2.1  PHOS 3.0 4.0 4.0 5.0* 5.3*   Liver Function Tests: Recent Labs  Lab 12/25/21 0024 12/25/21 0115 12/31/21 0522  AST 68* 67* 67*  ALT 44 44 69*  ALKPHOS 45 53 53   BILITOT 2.5* 2.7* 1.2  PROT 7.7 7.7 7.6  ALBUMIN 4.0 4.1 3.8   No results for input(s): "LIPASE", "AMYLASE" in the last 168 hours. No results for input(s): "AMMONIA" in the last 168 hours. CBC: Recent Labs  Lab 12/25/21 0024 12/28/21 0235 12/31/21 0522  WBC 5.8 7.0 6.7  NEUTROABS 3.7  --   --   HGB 13.8 12.8* 13.7  HCT 41.0 38.9* 41.4  MCV 103.8* 105.1* 103.8*  PLT 97* 113* 155   Cardiac Enzymes: No results for input(s): "CKTOTAL", "CKMB", "CKMBINDEX", "TROPONINI" in the last 168 hours. BNP: Invalid input(s): "POCBNP" CBG: Recent Labs  Lab 12/30/21 0526 12/30/21 1124 12/30/21 1536 12/31/21 0047 12/31/21 0558  GLUCAP 106* 125* 170* 117* 106*   D-Dimer No results for input(s): "DDIMER" in the last 72 hours. Hgb A1c No results for input(s): "HGBA1C" in the last 72 hours. Lipid Profile No results for input(s): "CHOL", "HDL", "LDLCALC", "TRIG", "CHOLHDL", "LDLDIRECT" in the last 72 hours. Thyroid function studies No results for input(s): "TSH", "T4TOTAL", "T3FREE", "THYROIDAB" in the last 72 hours.  Invalid input(s): "FREET3" Anemia work up No results for input(s): "VITAMINB12", "FOLATE", "FERRITIN", "TIBC", "IRON", "RETICCTPCT" in the last 72 hours. Urinalysis No results found for: "COLORURINE", "APPEARANCEUR", "LABSPEC", "PHURINE", "GLUCOSEU", "HGBUR", "BILIRUBINUR", "KETONESUR", "PROTEINUR", "UROBILINOGEN", "NITRITE", "LEUKOCYTESUR" Sepsis Labs Recent Labs  Lab 12/25/21 0024 12/28/21 0235 12/31/21 0522  WBC 5.8 7.0 6.7   Microbiology No results found for this or any previous visit (from the past 240 hour(s)).   Time coordinating discharge: Over 30 minutes  SIGNED:   01/02/22, MD  [facility] 12/31/2021, 1:19 PM Pager   If 7PM-7AM, please contact night-coverage

## 2022-01-02 LAB — DRUG PROFILE, UR, 9 DRUGS (LABCORP)
Amphetamines, Urine: NEGATIVE ng/mL
Barbiturate, Ur: POSITIVE ng/mL — AB
Benzodiazepine Quant, Ur: NEGATIVE ng/mL
Cannabinoid Quant, Ur: NEGATIVE ng/mL
Cocaine (Metab.): NEGATIVE ng/mL
Methadone Screen, Urine: NEGATIVE ng/mL
Opiate Quant, Ur: POSITIVE ng/mL — AB
Phencyclidine, Ur: NEGATIVE ng/mL
Propoxyphene, Urine: NEGATIVE ng/mL

## 2022-01-11 ENCOUNTER — Inpatient Hospital Stay
Admission: EM | Admit: 2022-01-11 | Discharge: 2022-01-13 | DRG: 897 | Disposition: A | Discharge status: Home / Self Care | Payer: No Typology Code available for payment source | Attending: Internal Medicine | Admitting: Internal Medicine

## 2022-01-11 ENCOUNTER — Encounter: Payer: Self-pay | Admitting: Internal Medicine

## 2022-01-11 ENCOUNTER — Inpatient Hospital Stay: Payer: No Typology Code available for payment source

## 2022-01-11 ENCOUNTER — Other Ambulatory Visit: Payer: Self-pay

## 2022-01-11 DIAGNOSIS — Z87891 Personal history of nicotine dependence: Secondary | ICD-10-CM

## 2022-01-11 DIAGNOSIS — I10 Essential (primary) hypertension: Secondary | ICD-10-CM | POA: Diagnosis present

## 2022-01-11 DIAGNOSIS — I110 Hypertensive heart disease with heart failure: Secondary | ICD-10-CM | POA: Diagnosis present

## 2022-01-11 DIAGNOSIS — Z79899 Other long term (current) drug therapy: Secondary | ICD-10-CM

## 2022-01-11 DIAGNOSIS — R262 Difficulty in walking, not elsewhere classified: Secondary | ICD-10-CM | POA: Diagnosis present

## 2022-01-11 DIAGNOSIS — I739 Peripheral vascular disease, unspecified: Secondary | ICD-10-CM | POA: Diagnosis present

## 2022-01-11 DIAGNOSIS — F10930 Alcohol use, unspecified with withdrawal, uncomplicated: Secondary | ICD-10-CM | POA: Diagnosis not present

## 2022-01-11 DIAGNOSIS — R9431 Abnormal electrocardiogram [ECG] [EKG]: Secondary | ICD-10-CM | POA: Diagnosis not present

## 2022-01-11 DIAGNOSIS — K760 Fatty (change of) liver, not elsewhere classified: Secondary | ICD-10-CM | POA: Diagnosis present

## 2022-01-11 DIAGNOSIS — F419 Anxiety disorder, unspecified: Secondary | ICD-10-CM | POA: Diagnosis present

## 2022-01-11 DIAGNOSIS — F1021 Alcohol dependence, in remission: Secondary | ICD-10-CM | POA: Diagnosis present

## 2022-01-11 DIAGNOSIS — F10139 Alcohol abuse with withdrawal, unspecified: Principal | ICD-10-CM | POA: Diagnosis present

## 2022-01-11 DIAGNOSIS — Y904 Blood alcohol level of 80-99 mg/100 ml: Secondary | ICD-10-CM | POA: Diagnosis present

## 2022-01-11 DIAGNOSIS — I5032 Chronic diastolic (congestive) heart failure: Secondary | ICD-10-CM | POA: Diagnosis present

## 2022-01-11 DIAGNOSIS — E8729 Other acidosis: Secondary | ICD-10-CM | POA: Diagnosis present

## 2022-01-11 DIAGNOSIS — E663 Overweight: Secondary | ICD-10-CM | POA: Diagnosis present

## 2022-01-11 DIAGNOSIS — Z880 Allergy status to penicillin: Secondary | ICD-10-CM | POA: Diagnosis not present

## 2022-01-11 DIAGNOSIS — R945 Abnormal results of liver function studies: Secondary | ICD-10-CM | POA: Diagnosis present

## 2022-01-11 DIAGNOSIS — E876 Hypokalemia: Secondary | ICD-10-CM | POA: Diagnosis present

## 2022-01-11 DIAGNOSIS — M7989 Other specified soft tissue disorders: Secondary | ICD-10-CM | POA: Diagnosis present

## 2022-01-11 DIAGNOSIS — F1091 Alcohol use, unspecified, in remission: Secondary | ICD-10-CM | POA: Diagnosis present

## 2022-01-11 DIAGNOSIS — F10939 Alcohol use, unspecified with withdrawal, unspecified: Principal | ICD-10-CM | POA: Diagnosis present

## 2022-01-11 DIAGNOSIS — F1010 Alcohol abuse, uncomplicated: Secondary | ICD-10-CM | POA: Diagnosis present

## 2022-01-11 LAB — COMPREHENSIVE METABOLIC PANEL
ALT: 41 U/L (ref 0–44)
AST: 61 U/L — ABNORMAL HIGH (ref 15–41)
Albumin: 4.4 g/dL (ref 3.5–5.0)
Alkaline Phosphatase: 58 U/L (ref 38–126)
Anion gap: 16 — ABNORMAL HIGH (ref 5–15)
BUN: 8 mg/dL (ref 6–20)
CO2: 18 mmol/L — ABNORMAL LOW (ref 22–32)
Calcium: 9.1 mg/dL (ref 8.9–10.3)
Chloride: 106 mmol/L (ref 98–111)
Creatinine, Ser: 0.6 mg/dL — ABNORMAL LOW (ref 0.61–1.24)
GFR, Estimated: >60 mL/min (ref >60)
Glucose, Bld: 115 mg/dL — ABNORMAL HIGH (ref 70–99)
Potassium: 3.7 mmol/L (ref 3.5–5.1)
Sodium: 140 mmol/L (ref 135–145)
Total Bilirubin: 1 mg/dL (ref 0.3–1.2)
Total Protein: 8.5 g/dL — ABNORMAL HIGH (ref 6.5–8.1)

## 2022-01-11 LAB — URINE DRUG SCREEN, QUALITATIVE (ARMC ONLY)
Amphetamines, Ur Screen: NOT DETECTED
Barbiturates, Ur Screen: POSITIVE — AB
Benzodiazepine, Ur Scrn: POSITIVE — AB
Cannabinoid 50 Ng, Ur ~~LOC~~: NOT DETECTED
Cocaine Metabolite,Ur ~~LOC~~: NOT DETECTED
MDMA (Ecstasy)Ur Screen: NOT DETECTED
Methadone Scn, Ur: NOT DETECTED
Opiate, Ur Screen: NOT DETECTED
Phencyclidine (PCP) Ur S: NOT DETECTED
Tricyclic, Ur Screen: NOT DETECTED

## 2022-01-11 LAB — CBC
HCT: 42.9 % (ref 39.0–52.0)
Hemoglobin: 14.5 g/dL (ref 13.0–17.0)
MCH: 34.4 pg — ABNORMAL HIGH (ref 26.0–34.0)
MCHC: 33.8 g/dL (ref 30.0–36.0)
MCV: 101.9 fL — ABNORMAL HIGH (ref 80.0–100.0)
Platelets: 154 10*3/uL (ref 150–400)
RBC: 4.21 MIL/uL — ABNORMAL LOW (ref 4.22–5.81)
RDW: 14.2 % (ref 11.5–15.5)
WBC: 7.6 10*3/uL (ref 4.0–10.5)
nRBC: 0 % (ref 0.0–0.2)

## 2022-01-11 LAB — TROPONIN I (HIGH SENSITIVITY): Troponin I (High Sensitivity): 7 ng/L (ref <18)

## 2022-01-11 LAB — ETHANOL: Alcohol, Ethyl (B): 86 mg/dL — ABNORMAL HIGH (ref <10)

## 2022-01-11 LAB — CK: Total CK: 184 U/L (ref 49–397)

## 2022-01-11 LAB — MAGNESIUM: Magnesium: 1.9 mg/dL (ref 1.7–2.4)

## 2022-01-11 MED ORDER — LORAZEPAM 1 MG PO TABS
1.0000 mg | ORAL_TABLET | ORAL | Status: DC | PRN
  Administered 2022-01-12 – 2022-01-13 (×3): 1 mg via ORAL
  Filled 2022-01-11 (×3): qty 1

## 2022-01-11 MED ORDER — DEXTROSE-NACL 5-0.9 % IV SOLN
1000.0000 mL | Freq: Once | INTRAVENOUS | Status: AC
  Administered 2022-01-11: 1000 mL via INTRAVENOUS

## 2022-01-11 MED ORDER — ENOXAPARIN SODIUM 40 MG/0.4ML IJ SOSY
40.0000 mg | PREFILLED_SYRINGE | INTRAMUSCULAR | Status: DC
  Administered 2022-01-11 – 2022-01-12 (×2): 40 mg via SUBCUTANEOUS
  Filled 2022-01-11 (×2): qty 0.4

## 2022-01-11 MED ORDER — FOLIC ACID 1 MG PO TABS
1.0000 mg | ORAL_TABLET | Freq: Every day | ORAL | Status: DC
  Administered 2022-01-12 – 2022-01-13 (×2): 1 mg via ORAL
  Filled 2022-01-11 (×2): qty 1

## 2022-01-11 MED ORDER — ADULT MULTIVITAMIN W/MINERALS CH
1.0000 | ORAL_TABLET | Freq: Every day | ORAL | Status: DC
  Administered 2022-01-12 – 2022-01-13 (×2): 1 via ORAL
  Filled 2022-01-11 (×2): qty 1

## 2022-01-11 MED ORDER — CHLORDIAZEPOXIDE HCL 25 MG PO CAPS
25.0000 mg | ORAL_CAPSULE | Freq: Every day | ORAL | Status: AC
  Administered 2022-01-12: 25 mg via ORAL
  Filled 2022-01-11: qty 1

## 2022-01-11 MED ORDER — IBUPROFEN 400 MG PO TABS
400.0000 mg | ORAL_TABLET | Freq: Once | ORAL | Status: AC
  Administered 2022-01-11: 400 mg via ORAL
  Filled 2022-01-11: qty 1

## 2022-01-11 MED ORDER — CHLORDIAZEPOXIDE HCL 25 MG PO CAPS
50.0000 mg | ORAL_CAPSULE | Freq: Once | ORAL | Status: AC
  Administered 2022-01-11: 50 mg via ORAL
  Filled 2022-01-11: qty 2

## 2022-01-11 MED ORDER — THIAMINE HCL 100 MG/ML IJ SOLN: 100.0000 mg | Freq: Every day | INTRAMUSCULAR | Status: DC

## 2022-01-11 MED ORDER — LORAZEPAM 2 MG/ML IJ SOLN
0.0000 mg | INTRAMUSCULAR | Status: DC
  Administered 2022-01-12 – 2022-01-13 (×3): 1 mg via INTRAVENOUS
  Filled 2022-01-11 (×4): qty 1

## 2022-01-11 MED ORDER — THIAMINE MONONITRATE 100 MG PO TABS
100.0000 mg | ORAL_TABLET | Freq: Every day | ORAL | Status: DC
  Administered 2022-01-12 – 2022-01-13 (×2): 100 mg via ORAL
  Filled 2022-01-11 (×2): qty 1

## 2022-01-11 MED ORDER — DIAZEPAM 5 MG/ML IJ SOLN
20.0000 mg | Freq: Once | INTRAMUSCULAR | Status: AC
  Administered 2022-01-11: 20 mg via INTRAVENOUS
  Filled 2022-01-11: qty 4

## 2022-01-11 MED ORDER — SODIUM CHLORIDE 0.9 % IV BOLUS
1000.0000 mL | Freq: Once | INTRAVENOUS | Status: AC
  Administered 2022-01-11: 1000 mL via INTRAVENOUS

## 2022-01-11 MED ORDER — METOPROLOL SUCCINATE ER 50 MG PO TB24
50.0000 mg | ORAL_TABLET | Freq: Every day | ORAL | Status: DC
  Administered 2022-01-12: 50 mg via ORAL
  Filled 2022-01-11: qty 1

## 2022-01-11 MED ORDER — THIAMINE HCL 100 MG/ML IJ SOLN
100.0000 mg | Freq: Once | INTRAMUSCULAR | Status: AC
  Administered 2022-01-11: 100 mg via INTRAVENOUS
  Filled 2022-01-11: qty 2

## 2022-01-11 MED ORDER — LORAZEPAM 2 MG/ML IJ SOLN
1.0000 mg | INTRAMUSCULAR | Status: DC | PRN
  Administered 2022-01-12 (×2): 2 mg via INTRAVENOUS
  Filled 2022-01-11: qty 1

## 2022-01-11 MED ORDER — FOLIC ACID 1 MG PO TABS
1.0000 mg | ORAL_TABLET | Freq: Once | ORAL | Status: AC
  Administered 2022-01-11: 1 mg via ORAL
  Filled 2022-01-11: qty 1

## 2022-01-11 MED ORDER — DIAZEPAM 5 MG/ML IJ SOLN
10.0000 mg | Freq: Once | INTRAMUSCULAR | Status: AC
  Administered 2022-01-11: 10 mg via INTRAVENOUS
  Filled 2022-01-11: qty 2

## 2022-01-11 MED ORDER — ACETAMINOPHEN 650 MG RE SUPP: 650.0000 mg | Freq: Four times a day (QID) | RECTAL | Status: DC | PRN

## 2022-01-11 MED ORDER — METOPROLOL TARTRATE 5 MG/5ML IV SOLN: 5.0000 mg | Freq: Four times a day (QID) | INTRAVENOUS | Status: DC | PRN

## 2022-01-11 MED ORDER — ACETAMINOPHEN 325 MG PO TABS
650.0000 mg | ORAL_TABLET | Freq: Four times a day (QID) | ORAL | Status: DC | PRN
  Administered 2022-01-12 (×2): 650 mg via ORAL
  Filled 2022-01-11 (×2): qty 2

## 2022-01-11 MED ORDER — METOPROLOL SUCCINATE ER 25 MG PO TB24: 25.0000 mg | ORAL_TABLET | Freq: Once | ORAL | Status: DC

## 2022-01-11 MED ORDER — METOPROLOL TARTRATE 5 MG/5ML IV SOLN: 5.0000 mg | Freq: Once | INTRAVENOUS | Status: DC

## 2022-01-11 MED ORDER — LORAZEPAM 2 MG/ML IJ SOLN: 0.0000 mg | Freq: Three times a day (TID) | INTRAMUSCULAR | Status: DC

## 2022-01-11 NOTE — ED Notes (Signed)
Pt in US at this time 

## 2022-01-11 NOTE — ED Triage Notes (Signed)
Pt here via [facility] with bilateral swollen feet and cramping. Pt also c/o weakness. Pt has hx of etoh abuse and would like to see a Education officer, museum in hopes of getting him into rehab. Pt last drink was 2100 Tuesday night. Pt normally drinks a 12 pack a day. Pt also has anxiety.

## 2022-01-11 NOTE — ED Provider Notes (Signed)
[facility] Provider Note    Event Date/Time   First MD Initiated Contact with Patient 01/11/22 1511     (approximate)   History   Leg Swelling and Behavior Problem   HPI  Lawrence Rowe is a 52 y.o. male  with opmh ETOH use disorder who presents because of foot cramping.  Patient tells me that upon wakening this morning he had significant cramping in the bilateral feet and is having unsteadiness and difficulty ambulating.  Last drink was around 8 PM last night.  Typically drinks 12 pack of beer daily.  Denies history of withdrawal seizures but does feel like he has the shakes currently.  Denies hallucinations.  Patient Dors is feeling anxious but denies suicidality.  No other drug use.     Past Medical History:  Diagnosis Date   Alcohol abuse     Patient Active Problem List   Diagnosis Date Noted   Hypokalemia 12/27/2021   Wernicke encephalopathy syndrome 12/27/2021   Delirium tremens ([place]) 62/13/0865   Alcoholic hepatitis 78/46/9629   Alcohol withdrawal ([place]) 12/23/2021   Transaminitis 12/23/2021   Unilateral edema of lower extremity 12/23/2021   Dermatitis 05/10/2021   Primary hypertension 05/10/2021   Tachycardia with hypertension 04/19/2021   PAD (peripheral artery disease) ([place]) 04/19/2021   Alcohol abuse 52/84/1324   Metabolic syndrome 40/10/2723     Physical Exam  Triage Vital Signs: ED Triage Vitals [01/11/22 1318]  Enc Vitals Group     BP (!) 180/114     Pulse Rate (!) 136     Resp 20     Temp 98.1 F (36.7 C)     Temp Source Oral     SpO2 96 %     Weight 217 lb 2.5 oz (98.5 kg)     Height 6' (1.829 m)     Head Circumference      Peak Flow      Pain Score 0     Pain Loc      Pain Edu?      Excl. in [place]?     Most recent vital signs: Vitals:   01/11/22 1610 01/11/22 1748  BP: (!) 181/99 (!) 170/112  Pulse: (!) 119 (!) 115  Resp: 20 20  Temp: 97.7 F (36.5 C) 98 F (36.7 C)  SpO2: 96% 96%      General: Awake, no distress.  CV:  Good peripheral perfusion.  Pitting edema bilateral lower extremities Resp:  Normal effort.  Abd:  No distention.  Neuro:             Awake, Alert, Oriented x 3  Other:  Patient is tremulous and anxious appearing, normal mentation 2+ DP pulses, no open wounds on the feet  ED Results / Procedures / Treatments  Labs (all labs ordered are listed, but only abnormal results are displayed) Labs Reviewed  COMPREHENSIVE METABOLIC PANEL - Abnormal; Notable for the following components:      Result Value   CO2 18 (*)    Glucose, Bld 115 (*)    Creatinine, Ser 0.60 (*)    Total Protein 8.5 (*)    AST 61 (*)    Anion gap 16 (*)    All other components within normal limits  ETHANOL - Abnormal; Notable for the following components:   Alcohol, Ethyl (B) 86 (*)    All other components within normal limits  CBC - Abnormal; Notable for the following components:   RBC 4.21 (*)  MCV 101.9 (*)    MCH 34.4 (*)    All other components within normal limits  URINE DRUG SCREEN, QUALITATIVE ([facility] ONLY) - Abnormal; Notable for the following components:   Barbiturates, Ur Screen POSITIVE (*)    Benzodiazepine, Ur Scrn POSITIVE (*)    All other components within normal limits  MAGNESIUM     EKG  EKG reviewed interpreted myself as sinus tachycardia normal axis normal intervals no acute any changes   RADIOLOGY    PROCEDURES:  Critical Care performed: Yes, see critical care procedure note(s)  .Critical Care  Performed by: Rada Hay, MD Authorized by: Rada Hay, MD   Critical care provider statement:    Critical care time (minutes):  30   Critical care was time spent personally by me on the following activities:  Development of treatment plan with patient or surrogate, discussions with consultants, evaluation of patient's response to treatment, examination of patient, ordering and review of laboratory studies, ordering and review of  radiographic studies, ordering and performing treatments and interventions, pulse oximetry, re-evaluation of patient's condition and review of old charts     MEDICATIONS ORDERED IN ED: Medications  diazepam (VALIUM) injection 20 mg (has no administration in time range)  thiamine (VITAMIN B1) injection 100 mg (100 mg Intravenous Given 4/0/98 1191)  folic acid (FOLVITE) tablet 1 mg (1 mg Oral Given 01/11/22 1611)  diazepam (VALIUM) injection 10 mg (10 mg Intravenous Given 01/11/22 1611)  dextrose 5 %-0.9 % sodium chloride infusion (0 mLs Intravenous Stopped 01/11/22 1748)  diazepam (VALIUM) injection 10 mg (10 mg Intravenous Given 01/11/22 1716)  sodium chloride 0.9 % bolus 1,000 mL (1,000 mLs Intravenous New Bag/Given 01/11/22 1827)  diazepam (VALIUM) injection 20 mg (20 mg Intravenous Given 01/11/22 1825)  ibuprofen (ADVIL) tablet 400 mg (400 mg Oral Given 01/11/22 1825)     IMPRESSION / MDM / [place] / ED COURSE  I reviewed the triage vital signs and the nursing notes.                              Patient's presentation is most consistent with acute presentation with potential threat to life or bodily function.  Differential diagnosis includes, but is not limited to, alcohol withdrawal, DTs, alcohol ketoacidosis  Patient is a 52 year old male with alcohol use disorder rinks about 12 beers per day presents with concern for bilateral feet cramping.  He also appears to be in alcohol withdrawal tachycardic hypertensive and tremulous on arrival.  Still mentating okay not hallucinating.  Says he drank normal amount yesterday so somewhat unclear why he is having withdrawal at this point typically drinks in the evening last drink was at 8 PM yesterday.  Patient given thiamine folate and D5 normal saline infusion because appears to have a component of AKA with anion gap 16 bicarb 18.  Potassium magnesium are okay.  Patient given fluid bolus multiple doses of IV Valium.  Continues to be tremulous  tachycardic in the ED.  This point do not think he will be appropriate for outpatient treatment of withdrawal and will consult the hospitalist for admission.       FINAL CLINICAL IMPRESSION(S) / ED DIAGNOSES   Final diagnoses:  None     Rx / DC Orders   ED Discharge Orders     None        Note:  This document was prepared using Dragon voice recognition software and  may include unintentional dictation errors.   Georga Hacking, MD 01/11/22 2035

## 2022-01-11 NOTE — H&P (Signed)
History and Physical    Patient: Lawrence Rowe IDP:[id number] DOB: 1970-01-29 DOA: 01/11/2022 DOS: the patient was seen and examined on 01/11/2022 PCP: George Hugh, MD  Patient coming from: Home  Chief Complaint:  Chief Complaint  Patient presents with   Leg Swelling   Behavior Problem   HPI: GEN CLAGG is a 52 y.o. male with medical history significant of alcohol abuse, transaminitis, Hypertension, PAD, apparently presents with cramps in his feet.  Pt is not sure about wanting to stop drinking.  Last drink was about 8pm last nite.  Pt typically drinks about 12 pack of beer per day.  Pt denies withdrawal seizures but feels shaky, pt denies hallucination.  No prior hx of etoh withdrawal.  Pt appears anxious.    In ED, T 97.7  P 137  Bp 162./95  pox 96% on RA  Na 140, K 3.7 Bun 8, Creat 0.6 Ast 61, Alt 41, Alk phos 58, T. Bili 1.0  Hco3 18, AG 16 Wbc 7.6, hgb 14.5, Plt 154  ETOH level 86    EKG st at 130, nl axis, nl pr, no st-t changes c/w acute ischemia, note EKG poor quality UDS benzo +, barbituate +  Pt given valium 64m iv x 2, valvium 237miv x2 thiamine 10011mv x1,  pt appears to be in withdrawal per ED, ED requesting that pt be admitted for etoh withdrawal   Review of Systems: negative for all 10 organ systems except for + above HPI  Past Medical History:  Diagnosis Date   Alcohol abuse    Past Surgical History:  Procedure Laterality Date   ELBOW SURGERY Left    Social History:  reports that he quit smoking about 4 years ago. His smoking use included cigarettes. He has never used smokeless tobacco. He reports current alcohol use of about 12.0 standard drinks of alcohol per week. He reports that he does not use drugs.  Allergies  Allergen Reactions   Penicillins Other (See Comments)    Family History  Problem Relation Age of Onset   Alcohol abuse Neg Hx     Prior to Admission medications   Medication Sig Start Date End Date Taking?  Authorizing Provider  folic acid (FOLVITE) 1 MG tablet Take 1 tablet (1 mg total) by mouth daily. 01/01/22   KumShawna ClampD  metoprolol succinate (TOPROL-XL) 50 MG 24 hr tablet Take 1 tablet (50 mg total) by mouth daily. Take with or immediately following a meal. 09/25/21   StaCarrie MewD  thiamine (VITAMIN B1) 100 MG tablet Take 1 tablet (100 mg total) by mouth daily. 12/31/21   KumShawna ClampD    Physical Exam: Vitals:   01/11/22 1318 01/11/22 1610 01/11/22 1748 01/11/22 2038  BP: (!) 180/114 (!) 181/99 (!) 170/112 (!) 198/96  Pulse: (!) 136 (!) 119 (!) 115 (!) 110  Resp: _0 Temp: 98.1 F (36.7 C) 97.7 F (36.5 C) 98 F (36.7 C)   TempSrc: Oral  Oral   SpO2: 96% 96% 96% 96%  Weight: 98.5 kg     Height: 6' (1.829 m)      Heent: anciteric Neck: no jvd Heart: tachy s1, s2, no m/g/r Lung: ctab Abd: soft, nt, nd, +bs Ext: no c/c/e Skin : no rash, + telangiectasia,slight palmar erythema,  Neuro: nonfocal, + asterixis,  + resting tremor, no cogwheeling.   Data Reviewed:   Assessment and Plan: ETOH withdrawal  Librium 36m67m qday x 1 day,  then 65m po qday x 1 day CIWA (ativan ) iv  Sinus tachycardia likely due to ETOH withdrawal Check trop Check tsh Check cardiac echo r/o etoh cardiomyopathy Lopressor 542miv x1, Toprol XL 5073mo qday  Hypertension uncontrolled  Lopressor 5mg53m x1=> couldn't give cause not on heart monitor, so give Toprol XL 25mg72mx1 Resume Toprol XL 50mg 8mday  Abnormal liver function Check acute hepatitis panel  Check cpk Check RUQ ultrasound  DVT prophylaxis: lovenox [place] FULL CODE Dispo: home  Pt will require inpatient > 2 nites admission for etoh withdrawal  and tachycardia.    Advance Care Planning:   Code Status: Prior FULL CODE  Consults: none  Family Communication:  w patient  Severity of Illness: The appropriate patient status for this patient is INPATIENT. Inpatient status is judged to be reasonable  and necessary in order to provide the required intensity of service to ensure the patient's safety. The patient's presenting symptoms, physical exam findings, and initial radiographic and laboratory data in the context of their chronic comorbidities is felt to place them at high risk for further clinical deterioration. Furthermore, it is not anticipated that the patient will be medically stable for discharge from the hospital within 2 midnights of admission.   * I certify that at the point of admission it is my clinical judgment that the patient will require inpatient hospital care spanning beyond 2 midnights from the point of admission due to high intensity of service, high risk for further deterioration and high frequency of surveillance required.*  Author: Sarkis Rhines Jani Gravel/03/2022 10:27 PM  For on call review [email or web address]

## 2022-01-11 NOTE — ED Notes (Signed)
Dinner tray given to pts nurse who advised to hold off on dinner for the time being. Pts food still available when pt gets medically cleared.

## 2022-01-11 NOTE — ED Triage Notes (Signed)
Arrives via [facility].  EMS called for weakness.  Per report, patient is anxious, admits to drinking a 12 pack of beer a day.  Last drink was last night.  Woke up this morning with weakness, shaking, anxiety.  VS wnl.  HR:  135.

## 2022-01-11 NOTE — ED Notes (Signed)
Pt dressed out by this tech and tech stephanie   Pt belongings:  Black jacket Blue tshirt Green/yellow pj pants Yellow socks Two black/white slides 1 phone  Pair of keys  1 black hat  Pair of green underwear  Pair of glasses (still with pt)

## 2022-01-11 NOTE — ED Notes (Signed)
Pt called this tech over due to his "feet cramping". RN made aware. Pt had not been to the bathroom since arriving to the hospital, so this tech encouraged the pt to go to the restroom with assistance. Pt agreed. Wheelchair was provided to transfer pt from recliner to the bathroom. Tech stayed in bathroom with pt and held the urinal. Pt did not want to stand up in the bathroom. Pt given time to void and let this tech know he was finished. Clean catch urine sample was collected and sent to lab.  Pt transferred from wheelchair to recliner and set up in a position of comfort. Pt denied needing anything else at this tine.

## 2022-01-12 ENCOUNTER — Encounter: Payer: Self-pay | Admitting: Internal Medicine

## 2022-01-12 ENCOUNTER — Other Ambulatory Visit: Payer: No Typology Code available for payment source

## 2022-01-12 DIAGNOSIS — I10 Essential (primary) hypertension: Secondary | ICD-10-CM | POA: Diagnosis not present

## 2022-01-12 DIAGNOSIS — F1010 Alcohol abuse, uncomplicated: Secondary | ICD-10-CM

## 2022-01-12 DIAGNOSIS — F10930 Alcohol use, unspecified with withdrawal, uncomplicated: Secondary | ICD-10-CM | POA: Diagnosis not present

## 2022-01-12 DIAGNOSIS — K760 Fatty (change of) liver, not elsewhere classified: Secondary | ICD-10-CM | POA: Diagnosis present

## 2022-01-12 DIAGNOSIS — E663 Overweight: Secondary | ICD-10-CM

## 2022-01-12 LAB — COMPREHENSIVE METABOLIC PANEL
ALT: 32 U/L (ref 0–44)
AST: 48 U/L — ABNORMAL HIGH (ref 15–41)
Albumin: 3.7 g/dL (ref 3.5–5.0)
Alkaline Phosphatase: 49 U/L (ref 38–126)
Anion gap: 10 (ref 5–15)
BUN: 10 mg/dL (ref 6–20)
CO2: 23 mmol/L (ref 22–32)
Calcium: 8.6 mg/dL — ABNORMAL LOW (ref 8.9–10.3)
Chloride: 106 mmol/L (ref 98–111)
Creatinine, Ser: 0.63 mg/dL (ref 0.61–1.24)
GFR, Estimated: >60 mL/min (ref >60)
Glucose, Bld: 117 mg/dL — ABNORMAL HIGH (ref 70–99)
Potassium: 3.2 mmol/L — ABNORMAL LOW (ref 3.5–5.1)
Sodium: 139 mmol/L (ref 135–145)
Total Bilirubin: 1.5 mg/dL — ABNORMAL HIGH (ref 0.3–1.2)
Total Protein: 7.4 g/dL (ref 6.5–8.1)

## 2022-01-12 LAB — CBC
HCT: 39 % (ref 39.0–52.0)
Hemoglobin: 13.4 g/dL (ref 13.0–17.0)
MCH: 34.8 pg — ABNORMAL HIGH (ref 26.0–34.0)
MCHC: 34.4 g/dL (ref 30.0–36.0)
MCV: 101.3 fL — ABNORMAL HIGH (ref 80.0–100.0)
Platelets: 119 10*3/uL — ABNORMAL LOW (ref 150–400)
RBC: 3.85 MIL/uL — ABNORMAL LOW (ref 4.22–5.81)
RDW: 14.2 % (ref 11.5–15.5)
WBC: 6.6 10*3/uL (ref 4.0–10.5)
nRBC: 0 % (ref 0.0–0.2)

## 2022-01-12 LAB — TSH: TSH: 3.249 u[IU]/mL (ref 0.350–4.500)

## 2022-01-12 LAB — HEPATITIS PANEL, ACUTE
HCV Ab: NONREACTIVE
Hep A IgM: NONREACTIVE
Hep B C IgM: NONREACTIVE
Hepatitis B Surface Ag: NONREACTIVE

## 2022-01-12 LAB — D-DIMER, QUANTITATIVE: D-Dimer, Quant: 0.39 ug/mL-FEU (ref 0.00–0.50)

## 2022-01-12 MED ORDER — HYDRALAZINE HCL 20 MG/ML IJ SOLN
5.0000 mg | INTRAMUSCULAR | Status: DC | PRN
  Administered 2022-01-12: 5 mg via INTRAVENOUS
  Filled 2022-01-12: qty 1

## 2022-01-12 MED ORDER — HYDRALAZINE HCL 50 MG PO TABS
100.0000 mg | ORAL_TABLET | Freq: Three times a day (TID) | ORAL | Status: DC
  Administered 2022-01-12 – 2022-01-13 (×3): 100 mg via ORAL
  Filled 2022-01-12 (×3): qty 2

## 2022-01-12 MED ORDER — METOPROLOL SUCCINATE ER 50 MG PO TB24
100.0000 mg | ORAL_TABLET | Freq: Every day | ORAL | Status: DC
  Administered 2022-01-13: 100 mg via ORAL
  Filled 2022-01-12: qty 2

## 2022-01-12 NOTE — Assessment & Plan Note (Addendum)
Not on any medications.  Asymptomatic

## 2022-01-12 NOTE — Assessment & Plan Note (Addendum)
Unusual etiology.  Patient is quite lucid and tells me he clearly drinks no more than 11-12 beers and only went down to 9-10 beers and only the last few days without missing any others.  Doubtful this alone would cause him to go into withdrawals although he does so some minor signs of tremors.  On Ativan protocol.  I suspect that his anxiety and shakiness may be coming from taking his metoprolol inconsistently.  Nevertheless, patient looks to be stable.  Will discharge home on Librium taper.  Patient states that he is not going to drink for at least a week or so.  Advised him that he really needs to quit drinking altogether.

## 2022-01-12 NOTE — Assessment & Plan Note (Signed)
Advised patient of this.  Advised weight loss and exercise and stop drinking.

## 2022-01-12 NOTE — Assessment & Plan Note (Addendum)
Continue Toprol, advised patient to take consistently every day

## 2022-01-12 NOTE — Assessment & Plan Note (Signed)
Replacing as needed 

## 2022-01-12 NOTE — ED Notes (Signed)
vol

## 2022-01-12 NOTE — Assessment & Plan Note (Signed)
Secondary to alcohol use 

## 2022-01-12 NOTE — ED Notes (Signed)
Pt taken the bathroom via [place] by this RN. Pt able to ambulate with x1 assistance.

## 2022-01-12 NOTE — Hospital Course (Signed)
52 year old male with past medical history of alcohol abuse (drinks 11-12 beers daily), hypertension and PAD who presented to the emergency room after concerns for withdrawal as he was having shakes.  Patient states that he decided to cut back on his drinking, but states that he only cut back to 9-10 beers that was just a few days ago.  He states that he did not drink any less than this and came in for further evaluation.  In the emergency room, noted to have an alcohol level of 86 and urine drug screen positive for benzodiazepines and barbiturates.  Admitted to the hospitalist service for alcohol withdrawal.

## 2022-01-12 NOTE — ED Notes (Signed)
Belongings given back to pt as he is a medical admit, charge Benay Pillow aware and ok with this.

## 2022-01-12 NOTE — ED Notes (Signed)
Report given and care endorsed to [place], [place]

## 2022-01-12 NOTE — Assessment & Plan Note (Signed)
Meets criteria BMI greater than 25 

## 2022-01-12 NOTE — ED Notes (Signed)
Pt provided with diet cola per request, no further needs expressed at this time. WCTM.

## 2022-01-12 NOTE — ED Notes (Signed)
Pt placed on IP bed at this time. Pt denies any needs, WCTM.

## 2022-01-12 NOTE — Progress Notes (Signed)
[facility] Progress Note  Patient: Lawrence Rowe    BWL:[id number]  DOA: 01/11/2022    Date of Service: the patient was seen and examined on 01/12/2022  Brief hospital course: 52 year old male with past medical history of alcohol abuse (drinks 11-12 beers daily), hypertension and PAD who presented to the emergency room after concerns for withdrawal as he was having shakes.  Patient states that he decided to cut back on his drinking, but states that he only cut back to 9-10 beers that was just a few days ago.  He states that he did not drink any less than this and came in for further evaluation.  In the emergency room, noted to have an alcohol level of 86 and urine drug screen positive for benzodiazepines and barbiturates.  Admitted to the hospitalist service for alcohol withdrawal.   Assessment and Plan: * Alcohol withdrawal ([place]) Unusual etiology.  Patient is quite lucid and tells me he clearly drinks no more than 11-12 beers and only went down to 9-10 beers and only the last few days without missing any others.  Doubtful this alone would cause him to go into withdrawals although he does so some minor signs of tremors.  On Librium protocol.  He is not interested in fully quitting at this time, so would only need to get him through acute withdrawals and then can discharge home.  Alcohol abuse Thiamine and folate.  As above.  Primary hypertension Continue Toprol  Abnormal liver function Secondary to alcohol use  Fatty liver Advised patient of this.  Advised weight loss and exercise and stop drinking.  Hypokalemia Replacing as needed  Overweight (BMI 25.0-29.9) Meets criteria BMI greater than 25  PAD (peripheral artery disease) (HCC) Not on any medications.  Will clarify patient is to be on a daily aspirin.       Body mass index is 29.45 kg/m.        Consultants: None  Procedures: None  Antimicrobials: None  Code Status: Full code   Subjective: Patient  complains of feeling a little shaky  Objective: Noted elevated blood pressures Vitals:   01/12/22 1617 01/12/22 1632  BP: (!) 187/107   Pulse: 88 80  Resp: 18   Temp: 98 F (36.7 C)   SpO2:  99%   No intake or output data in the 24 hours ending 01/12/22 1633 Filed Weights   01/11/22 1318  Weight: 98.5 kg   Body mass index is 29.45 kg/m.  Exam:  General: Alert and oriented x 3, no acute distress HEENT: Normocephalic, atraumatic, mucous membranes are moist Cardiovascular: Regular rate and rhythm, borderline tachycardia Respiratory: Clear to auscultation bilaterally Abdomen: Clear to auscultation bilaterally soft, nontender, nondistended, positive bowel sounds Musculoskeletal: No clubbing or cyanosis or edema Skin: No skin breaks, tears or lesions Psychiatry: Patient is appropriate, no evidence of psychoses Neurology: Mild tremors  Data Reviewed: Potassium of 3.2, AST of 48 and total bilirubin of 1.5. Hemoglobin of 13.4 with MCV of 101.  Hepatitis panel unremarkable.  Disposition:  Status is: Inpatient Remains inpatient appropriate because:  -Getting through acute withdrawals    Anticipated discharge date: 1/6 or 1/7  Family Communication: Declined for me to call any family DVT Prophylaxis: enoxaparin (LOVENOX) injection 40 mg Start: 01/11/22 2300 SCDs Start: 01/11/22 2249    Author: Annita Brod ,MD 01/12/2022 4:33 PM  To reach On-call, see care teams to locate the attending and reach out via [email or web address]. Between 7PM-7AM, please contact night-coverage If you still have difficulty  reaching the attending provider, please page the [facility] (Director on Call) for [facility] on amion for assistance.

## 2022-01-12 NOTE — Assessment & Plan Note (Signed)
Thiamine and folate.  As above.

## 2022-01-12 NOTE — ED Notes (Signed)
Pt taken to bathroom via [place] by this RN and back to bed.

## 2022-01-12 NOTE — ED Notes (Signed)
Pt provided with new underwear and new scrubs.

## 2022-01-13 ENCOUNTER — Inpatient Hospital Stay (HOSPITAL_COMMUNITY)
Admit: 2022-01-13 | Discharge: 2022-01-13 | Disposition: A | Discharge status: Home / Self Care | Payer: No Typology Code available for payment source | Attending: Internal Medicine | Admitting: Internal Medicine

## 2022-01-13 DIAGNOSIS — R9431 Abnormal electrocardiogram [ECG] [EKG]: Secondary | ICD-10-CM

## 2022-01-13 DIAGNOSIS — K760 Fatty (change of) liver, not elsewhere classified: Secondary | ICD-10-CM | POA: Diagnosis not present

## 2022-01-13 DIAGNOSIS — I5032 Chronic diastolic (congestive) heart failure: Secondary | ICD-10-CM | POA: Diagnosis present

## 2022-01-13 DIAGNOSIS — F1010 Alcohol abuse, uncomplicated: Secondary | ICD-10-CM | POA: Diagnosis not present

## 2022-01-13 DIAGNOSIS — F10930 Alcohol use, unspecified with withdrawal, uncomplicated: Secondary | ICD-10-CM | POA: Diagnosis not present

## 2022-01-13 LAB — ECHOCARDIOGRAM COMPLETE
AR max vel: 2.62 cm2
AV Area VTI: 2.96 cm2
AV Area mean vel: 2.76 cm2
AV Mean grad: 4 mmHg
AV Peak grad: 7.7 mmHg
Ao pk vel: 1.39 m/s
Area-P 1/2: 3.87 cm2
Height: 72 in
MV VTI: 4.78 cm2
S' Lateral: 3.1 cm
Weight: 3474.45 oz

## 2022-01-13 LAB — COMPREHENSIVE METABOLIC PANEL
ALT: 34 U/L (ref 0–44)
AST: 51 U/L — ABNORMAL HIGH (ref 15–41)
Albumin: 4.1 g/dL (ref 3.5–5.0)
Alkaline Phosphatase: 57 U/L (ref 38–126)
Anion gap: 10 (ref 5–15)
BUN: 12 mg/dL (ref 6–20)
CO2: 20 mmol/L — ABNORMAL LOW (ref 22–32)
Calcium: 9.3 mg/dL (ref 8.9–10.3)
Chloride: 108 mmol/L (ref 98–111)
Creatinine, Ser: 0.66 mg/dL (ref 0.61–1.24)
GFR, Estimated: >60 mL/min (ref >60)
Glucose, Bld: 112 mg/dL — ABNORMAL HIGH (ref 70–99)
Potassium: 3.8 mmol/L (ref 3.5–5.1)
Sodium: 138 mmol/L (ref 135–145)
Total Bilirubin: 1.9 mg/dL — ABNORMAL HIGH (ref 0.3–1.2)
Total Protein: 8 g/dL (ref 6.5–8.1)

## 2022-01-13 LAB — BRAIN NATRIURETIC PEPTIDE: B Natriuretic Peptide: 174.6 pg/mL — ABNORMAL HIGH (ref 0.0–100.0)

## 2022-01-13 MED ORDER — CHLORDIAZEPOXIDE HCL 5 MG PO CAPS: ORAL_CAPSULE | ORAL | 0 refills | Status: AC

## 2022-01-13 NOTE — Progress Notes (Signed)
*  PRELIMINARY RESULTS* Echocardiogram 2D Echocardiogram has been performed.  Lawrence Rowe 01/13/2022, 10:14 AM

## 2022-01-13 NOTE — Discharge Summary (Signed)
Physician Discharge Summary   Patient: Lawrence Rowe MRN: [id number] DOB: 1970/02/13  Admit date:     01/11/2022  Discharge date: 01/13/22  Discharge Physician: Annita Brod   PCP: George Hugh, MD   Recommendations at discharge:   New medication: Librium taper  Discharge Diagnoses: Principal Problem:   Alcohol withdrawal ([place]) Active Problems:   Chronic diastolic CHF (congestive heart failure) ([place])   Alcohol abuse   Primary hypertension   Abnormal liver function   Fatty liver   Overweight (BMI 25.0-29.9)   PAD (peripheral artery disease) ([facility])  Resolved Problems:   Hypokalemia  Hospital Course: 52 year old male with past medical history of alcohol abuse (drinks 11-12 beers daily), hypertension and PAD who presented to the emergency room after concerns for withdrawal as he was having shakes.  Patient states that he decided to cut back on his drinking, but states that he only cut back to 9-10 beers that was just a few days ago.  He states that he did not drink any less than this and came in for further evaluation.  In the emergency room, noted to have an alcohol level of 86 and urine drug screen positive for benzodiazepines and barbiturates.  Admitted to the hospitalist service for alcohol withdrawal.  Assessment and Plan: * Alcohol withdrawal ([place]) Unusual etiology.  Patient is quite lucid and tells me he clearly drinks no more than 11-12 beers and only went down to 9-10 beers and only the last few days without missing any others.  Doubtful this alone would cause him to go into withdrawals although he does so some minor signs of tremors.  On Ativan protocol.  I suspect that his anxiety and shakiness may be coming from taking his metoprolol inconsistently.  Nevertheless, patient looks to be stable.  Will discharge home on Librium taper.  Patient states that he is not going to drink for at least a week or so.  Advised him that he really needs to quit drinking  altogether.  Chronic diastolic CHF (congestive heart failure) ([place]) Noted mildly elevated BNP.  Echocardiogram notes grade 1 diastolic dysfunction.  Euvolemic.  Alcohol abuse Thiamine and folate.  As above.  Primary hypertension Continue Toprol, advised patient to take consistently every day  Abnormal liver function Secondary to alcohol use  Fatty liver Advised patient of this.  Advised weight loss and exercise and stop drinking.  Hypokalemia-resolved as of 01/13/2022 Replacing as needed  Overweight (BMI 25.0-29.9) Meets criteria BMI greater than 25  PAD (peripheral artery disease) ([facility]) Not on any medications.  Asymptomatic        Pain control - [facility] Controlled Substance Reporting System database was reviewed. and patient was instructed, not to drive, operate heavy machinery, perform activities at heights, swimming or participation in water activities or provide baby-sitting services while on Pain, Sleep and Anxiety Medications; until their outpatient Physician has advised to do so again. Also recommended to not to take more than prescribed Pain, Sleep and Anxiety Medications.  Consultants: None Procedures performed: None Disposition: Home Diet recommendation:  Discharge Diet Orders (From admission, onward)     Start     Ordered   01/13/22 0000  Diet - low sodium heart healthy        01/13/22 1832           Heart healthy DISCHARGE MEDICATION: Allergies as of 01/13/2022       Reactions   Penicillins Other (See Comments)        Medication List  TAKE these medications    chlordiazePOXIDE 5 MG capsule Commonly known as: LIBRIUM Take 2 capsules (10 mg total) by mouth 3 (three) times daily for 2 days, THEN 1 capsule (5 mg total) 3 (three) times daily for 2 days, THEN 1 capsule (5 mg total) 2 (two) times daily for 2 days, THEN 1 capsule (5 mg total) daily for 2 days. Start taking on: January 5, 123456   folic acid 1 MG tablet Commonly known as:  FOLVITE Take 1 tablet (1 mg total) by mouth daily.   metoprolol succinate 50 MG 24 hr tablet Commonly known as: TOPROL-XL Take 1 tablet (50 mg total) by mouth daily. Take with or immediately following a meal.   thiamine 100 MG tablet Commonly known as: VITAMIN B1 Take 1 tablet (100 mg total) by mouth daily.        Discharge Exam: Filed Weights   01/11/22 1318 01/13/22 0200  Weight: 98.5 kg 98.5 kg   General: Alert and oriented x 3, no acute distress Cardiovascular: Regular rate and rhythm, S1-S2 Lungs: Clear to auscultation bilaterally  Condition at discharge: improving  The results of significant diagnostics from this hospitalization (including imaging, microbiology, ancillary and laboratory) are listed below for reference.   Imaging Studies: ECHOCARDIOGRAM COMPLETE  Result Date: 01/13/2022    ECHOCARDIOGRAM REPORT   Patient Name:   Lawrence Rowe Date of Exam: 01/13/2022 Medical Rec #:  [id number]             Height:       72.0 in Accession #:    [id number]            Weight:       217.2 lb Date of Birth:  Sep 17, 1970             BSA:          2.206 m Patient Age:    23 years              BP:           132/82 mmHg Patient Gender: M                     HR:           76 bpm. Exam Location:  [facility] Procedure: 2D Echo, Cardiac Doppler and Color Doppler Indications:     Abnormal ECG  History:         Patient has no prior history of Echocardiogram examinations.                  Abnormal ECG, PAD, Arrythmias:Tachycardia; Risk                  Factors:Hypertension. ETOH abuse-withdrawal.  Sonographer:     Wenda Low Referring Phys:  [place] Diagnosing Phys: Ida Rogue MD IMPRESSIONS  1. Left ventricular ejection fraction, by estimation, is 55 to 60%. The left ventricle has normal function. The left ventricle has no regional wall motion abnormalities. Left ventricular diastolic parameters are consistent with Grade I diastolic dysfunction (impaired relaxation).  2. Right  ventricular systolic function is normal. The right ventricular size is normal. There is normal pulmonary artery systolic pressure.  3. The mitral valve is normal in structure. No evidence of mitral valve regurgitation. No evidence of mitral stenosis.  4. The aortic valve is normal in structure. Aortic valve regurgitation is not visualized. No aortic stenosis is present.  5. The inferior vena cava is normal in size  with greater than 50% respiratory variability, suggesting right atrial pressure of 3 mmHg. FINDINGS  Left Ventricle: Left ventricular ejection fraction, by estimation, is 55 to 60%. The left ventricle has normal function. The left ventricle has no regional wall motion abnormalities. The left ventricular internal cavity size was normal in size. There is  no left ventricular hypertrophy. Left ventricular diastolic parameters are consistent with Grade I diastolic dysfunction (impaired relaxation). Right Ventricle: The right ventricular size is normal. No increase in right ventricular wall thickness. Right ventricular systolic function is normal. There is normal pulmonary artery systolic pressure. The tricuspid regurgitant velocity is 2.23 m/s, and  with an assumed right atrial pressure of 3 mmHg, the estimated right ventricular systolic pressure is [id number] mmHg. Left Atrium: Left atrial size was normal in size. Right Atrium: Right atrial size was normal in size. Pericardium: There is no evidence of pericardial effusion. Mitral Valve: The mitral valve is normal in structure. No evidence of mitral valve regurgitation. No evidence of mitral valve stenosis. MV peak gradient, 2.7 mmHg. The mean mitral valve gradient is 1.0 mmHg. Tricuspid Valve: The tricuspid valve is normal in structure. Tricuspid valve regurgitation is mild . No evidence of tricuspid stenosis. Aortic Valve: The aortic valve is normal in structure. Aortic valve regurgitation is not visualized. No aortic stenosis is present. Aortic valve mean  gradient measures 4.0 mmHg. Aortic valve peak gradient measures 7.7 mmHg. Aortic valve area, by VTI measures 2.96 cm. Pulmonic Valve: The pulmonic valve was normal in structure. Pulmonic valve regurgitation is not visualized. No evidence of pulmonic stenosis. Aorta: The aortic root is normal in size and structure. Venous: The inferior vena cava is normal in size with greater than 50% respiratory variability, suggesting right atrial pressure of 3 mmHg. IAS/Shunts: No atrial level shunt detected by color flow Doppler.  LEFT VENTRICLE PLAX 2D LVIDd:         5.50 cm   Diastology LVIDs:         3.10 cm   LV e' medial:    6.85 cm/s LV PW:         1.00 cm   LV E/e' medial:  7.6 LV IVS:        1.20 cm   LV e' lateral:   10.00 cm/s LVOT diam:     2.00 cm   LV E/e' lateral: 5.2 LV SV:         90 LV SV Index:   41 LVOT Area:     3.14 cm  RIGHT VENTRICLE RV Basal diam:  3.75 cm RV Mid diam:    3.80 cm TAPSE (M-mode): 2.6 cm LEFT ATRIUM             Index        RIGHT ATRIUM           Index LA diam:        4.20 cm 1.90 cm/m   RA Area:     20.70 cm LA Vol (A2C):   72.6 ml 32.90 ml/m  RA Volume:   61.30 ml  27.78 ml/m LA Vol (A4C):   69.7 ml 31.59 ml/m LA Biplane Vol: 73.2 ml 33.18 ml/m  AORTIC VALVE                    PULMONIC VALVE AV Area (Vmax):    2.62 cm     PV Vmax:       1.24 m/s AV Area (Vmean):   2.76 cm  PV Peak grad:  6.2 mmHg AV Area (VTI):     2.96 cm AV Vmax:           139.00 cm/s AV Vmean:          93.400 cm/s AV VTI:            0.304 m AV Peak Grad:      7.7 mmHg AV Mean Grad:      4.0 mmHg LVOT Vmax:         116.00 cm/s LVOT Vmean:        82.000 cm/s LVOT VTI:          0.286 m LVOT/AV VTI ratio: 0.94  AORTA Ao Root diam: 3.80 cm MITRAL VALVE               TRICUSPID VALVE MV Area (PHT): 3.87 cm    TR Peak grad:   19.9 mmHg MV Area VTI:   4.78 cm    TR Vmax:        223.00 cm/s MV Peak grad:  2.7 mmHg MV Mean grad:  1.0 mmHg    SHUNTS MV Vmax:       0.82 m/s    Systemic VTI:  0.29 m MV Vmean:       44.0 cm/s   Systemic Diam: 2.00 cm MV Decel Time: 196 msec MV E velocity: 51.90 cm/s MV A velocity: 73.90 cm/s MV E/A ratio:  0.70 Ida Rogue MD Electronically signed by Ida Rogue MD Signature Date/Time: 01/13/2022/2:19:06 PM    Final    US Abdomen Limited RUQ (LIVER/GB)  Result Date: 01/12/2022 CLINICAL DATA:  Abnormal liver function tests. EXAM: ULTRASOUND ABDOMEN LIMITED RIGHT UPPER QUADRANT COMPARISON:  None Available. FINDINGS: Gallbladder: No gallstones or wall thickening visualized (2.7 mm). A trace amount of pericholecystic fluid is noted. No sonographic Murphy sign noted by sonographer. Common bile duct: Diameter: 3.4 mm Liver: No focal lesion identified. The liver borders are lobulated in appearance. Diffusely increased echogenicity of the liver parenchyma is noted. Portal vein is patent on color Doppler imaging with normal direction of blood flow towards the liver. Other: None. IMPRESSION: 1. Trace amount of pericholecystic fluid. 2. Hepatic steatosis without focal liver lesions. Electronically Signed   By: Virgina Norfolk M.D.   On: 01/12/2022 00:14   US Venous Img Lower Unilateral Right  Result Date: 12/23/2021 CLINICAL DATA:  Leg swelling and discoloration EXAM: RIGHT LOWER EXTREMITY VENOUS DOPPLER ULTRASOUND TECHNIQUE: Gray-scale sonography with compression, as well as color and duplex ultrasound, were performed to evaluate the deep venous system(s) from the level of the common femoral vein through the popliteal and proximal calf veins. COMPARISON:  09/15/2021 FINDINGS: VENOUS Normal compressibility of the common femoral, superficial femoral, and popliteal veins, as well as the visualized calf veins. Visualized portions of profunda femoral vein and great saphenous vein unremarkable. No filling defects to suggest DVT on grayscale or color Doppler imaging. Doppler waveforms show normal direction of venous flow, normal respiratory plasticity and response to augmentation. Limited views of  the contralateral common femoral vein are unremarkable. OTHER None. Limitations: none IMPRESSION: Negative. Electronically Signed   By: Julian Hy M.D.   On: 12/23/2021 20:08   DG Chest Portable 1 View  Result Date: 12/23/2021 CLINICAL DATA:  Tachycardia, ETOH withdrawal EXAM: PORTABLE CHEST 1 VIEW COMPARISON:  None Available. FINDINGS: Lungs are clear.  No pleural effusion or pneumothorax. The heart is normal in size. IMPRESSION: No evidence of acute cardiopulmonary disease. Electronically Signed  By: Julian Hy M.D.   On: 12/23/2021 19:37    Microbiology: Results for orders placed or performed during the hospital encounter of 09/25/21  SARS Coronavirus 2 by RT PCR (hospital order, performed in [facility] hospital lab) *cepheid single result test* Anterior Nasal Swab     Status: None   Collection Time: 09/25/21 11:43 AM   Specimen: Anterior Nasal Swab  Result Value Ref Range Status   SARS Coronavirus 2 by RT PCR NEGATIVE NEGATIVE Final    Comment: (NOTE) SARS-CoV-2 target nucleic acids are NOT DETECTED.  The SARS-CoV-2 RNA is generally detectable in upper and lower respiratory specimens during the acute phase of infection. The lowest concentration of SARS-CoV-2 viral copies this assay can detect is 250 copies / mL. A negative result does not preclude SARS-CoV-2 infection and should not be used as the sole basis for treatment or other patient management decisions.  A negative result may occur with improper specimen collection / handling, submission of specimen other than nasopharyngeal swab, presence of viral mutation(s) within the areas targeted by this assay, and inadequate number of viral copies (<250 copies / mL). A negative result must be combined with clinical observations, patient history, and epidemiological information.  Fact Sheet for Patients:   https://www.patel.info/  Fact Sheet for Healthcare  Providers: https://hall.com/  This test is not yet approved or  cleared by the [place] FDA and has been authorized for detection and/or diagnosis of SARS-CoV-2 by FDA under an Emergency Use Authorization (EUA).  This EUA will remain in effect (meaning this test can be used) for the duration of the COVID-19 declaration under Section 564(b)(1) of the Act, 21 U.S.C. section 360bbb-3(b)(1), unless the authorization is terminated or revoked sooner.  Performed at [facility], [address] [place], [place] [address]     Labs: CBC: Recent Labs  Lab 01/11/22 1320 01/12/22 0435  WBC 7.6 6.6  HGB 14.5 13.4  HCT 42.9 39.0  MCV 101.9* 101.3*  PLT 154 123456*   Basic Metabolic Panel: Recent Labs  Lab 01/11/22 1320 01/12/22 0435 01/13/22 0642  NA 140 139 138  K 3.7 3.2* 3.8  CL 106 106 108  CO2 18* 23 20*  GLUCOSE 115* 117* 112*  BUN 8 10 12   CREATININE 0.60* 0.63 0.66  CALCIUM 9.1 8.6* 9.3  MG 1.9  --   --    Liver Function Tests: Recent Labs  Lab 01/11/22 1320 01/12/22 0435 01/13/22 0642  AST 61* 48* 51*  ALT 41 32 34  ALKPHOS 58 49 57  BILITOT 1.0 1.5* 1.9*  PROT 8.5* 7.4 8.0  ALBUMIN 4.4 3.7 4.1   CBG: No results for input(s): "GLUCAP" in the last 168 hours.  Discharge time spent: greater than 30 minutes.  Signed: Annita Brod, MD [facility] 01/13/2022

## 2022-01-13 NOTE — Assessment & Plan Note (Signed)
Noted mildly elevated BNP.  Echocardiogram notes grade 1 diastolic dysfunction.  Euvolemic.

## 2022-01-13 NOTE — Evaluation (Signed)
Physical Therapy Evaluation Patient Details Name: Lawrence Rowe MRN: [id number] DOB: 1970/03/16 Today's Date: 01/13/2022  History of Present Illness  Patient is a 52 year old male  who presented to the emergency room after concerns for alcohol withdrawal as he was having shakes. History of alcohol abuse, HTN, PAD.  Clinical Impression  Patient is agreeable to PT evaluation. He was seated in the chair eating lunch on arrival to the room. He lives alone and reports that he is independent with functional activity at baseline.  The patient has mild tremor noted today, mostly in BUE. He required no physical assistance for standing or ambulation. The patient ambulated a lap in the hallway without assistive device with no loss of balance. Sp02 98% on room air after walking. The patient is fatigued with activity. Encouraged patient to progress activity slowly and for routine ambulation for conditioning at home. Patient could benefit from HHPT if available. PT will continue to follow to maximize independence and facilitate return to prior level of function.      Recommendations for follow up therapy are one component of a multi-disciplinary discharge planning process, led by the attending physician.  Recommendations may be updated based on patient status, additional functional criteria and insurance authorization.  Follow Up Recommendations Home health PT      Assistance Recommended at Discharge PRN  Patient can return home with the following  Help with stairs or ramp for entrance;Assist for transportation    Equipment Recommendations None recommended by PT  Recommendations for Other Services       Functional Status Assessment Patient has had a recent decline in their functional status and demonstrates the ability to make significant improvements in function in a reasonable and predictable amount of time.     Precautions / Restrictions Precautions Precautions: Fall      Mobility  Bed  Mobility               General bed mobility comments: not assessed as patient sitting up on arrival and post session    Transfers Overall transfer level: Modified independent Equipment used: None                    Ambulation/Gait Ambulation/Gait assistance: Supervision Gait Distance (Feet): 175 Feet Assistive device: None Gait Pattern/deviations: Wide base of support, Step-through pattern Gait velocity: decreased     General Gait Details: patient ambulated in hallway without loss of balance. no shortness of breath noted. Sp02 98% on room air after the walk. mild tremor noted more at the start of the walk  Stairs            Wheelchair Mobility    Modified Rankin (Stroke Patients Only)       Balance Overall balance assessment: Mild deficits observed, not formally tested                                           Pertinent Vitals/Pain Pain Assessment Pain Assessment: No/denies pain    Home Living Family/patient expects to be discharged to:: Private residence Living Arrangements: Alone   Type of Home: House Home Access: Stairs to enter Entrance Stairs-Rails: None Entrance Stairs-Number of Steps: 3-4 Alternate Level Stairs-Number of Steps: 14 Home Layout: Two level Home Equipment: None      Prior Function Prior Level of Function : Independent/Modified Independent;Driving  Mobility Comments: patient reports he ambualtes in the home and around the grocery store without difficulty. one fall reported recently       Hand Dominance        Extremity/Trunk Assessment   Upper Extremity Assessment Upper Extremity Assessment: Overall WFL for tasks assessed (mild tremor is noted bilaterally)    Lower Extremity Assessment Lower Extremity Assessment: Generalized weakness (endurance impaired for sustained activity)       Communication      Cognition Arousal/Alertness: Awake/alert Behavior During Therapy: WFL  for tasks assessed/performed Overall Cognitive Status: Within Functional Limits for tasks assessed                                          General Comments General comments (skin integrity, edema, etc.): encouraged patient to progress activity slowly and for short distance ambulation for conditioning    Exercises     Assessment/Plan    PT Assessment Patient needs continued PT services  PT Problem List Decreased strength;Decreased activity tolerance;Decreased balance;Decreased mobility       PT Treatment Interventions DME instruction;Gait training;Stair training;Functional mobility training;Therapeutic activities;Therapeutic exercise;Balance training;Neuromuscular re-education;Cognitive remediation;Patient/family education    PT Goals (Current goals can be found in the Care Plan section)  Acute Rehab PT Goals Patient Stated Goal: to go home today PT Goal Formulation: With patient Time For Goal Achievement: 01/27/22 Potential to Achieve Goals: Fair    Frequency Min 2X/week     Co-evaluation               AM-PAC PT "6 Clicks" Mobility  Outcome Measure Help needed turning from your back to your side while in a flat bed without using bedrails?: None Help needed moving from lying on your back to sitting on the side of a flat bed without using bedrails?: None Help needed moving to and from a bed to a chair (including a wheelchair)?: None Help needed standing up from a chair using your arms (e.g., wheelchair or bedside chair)?: A Little Help needed to walk in hospital room?: A Little Help needed climbing 3-5 steps with a railing? : A Little 6 Click Score: 21    End of Session   Activity Tolerance: Patient tolerated treatment well Patient left: in chair;with call bell/phone within reach   PT Visit Diagnosis: Unsteadiness on feet (R26.81);Muscle weakness (generalized) (M62.81)    Time: 9983-3825 PT Time Calculation (min) (ACUTE ONLY): 36  min   Charges:   PT Evaluation $PT Eval Low Complexity: 1 Low PT Treatments $Therapeutic Activity: 8-22 mins        Minna Merritts, PT, MPT   Percell Locus 01/13/2022, 3:38 PM

## 2022-06-08 ENCOUNTER — Inpatient Hospital Stay: Payer: No Typology Code available for payment source

## 2022-06-08 ENCOUNTER — Inpatient Hospital Stay
Admission: EM | Admit: 2022-06-08 | Discharge: 2022-06-16 | DRG: 897 | Disposition: A | Discharge status: Home / Self Care | Payer: No Typology Code available for payment source | Attending: Obstetrics and Gynecology | Admitting: Obstetrics and Gynecology

## 2022-06-08 ENCOUNTER — Other Ambulatory Visit: Payer: Self-pay

## 2022-06-08 DIAGNOSIS — E871 Hypo-osmolality and hyponatremia: Secondary | ICD-10-CM | POA: Diagnosis present

## 2022-06-08 DIAGNOSIS — E876 Hypokalemia: Secondary | ICD-10-CM | POA: Diagnosis present

## 2022-06-08 DIAGNOSIS — F10930 Alcohol use, unspecified with withdrawal, uncomplicated: Secondary | ICD-10-CM | POA: Diagnosis not present

## 2022-06-08 DIAGNOSIS — Z87891 Personal history of nicotine dependence: Secondary | ICD-10-CM

## 2022-06-08 DIAGNOSIS — F10229 Alcohol dependence with intoxication, unspecified: Secondary | ICD-10-CM | POA: Diagnosis present

## 2022-06-08 DIAGNOSIS — D7589 Other specified diseases of blood and blood-forming organs: Secondary | ICD-10-CM | POA: Diagnosis present

## 2022-06-08 DIAGNOSIS — F10239 Alcohol dependence with withdrawal, unspecified: Principal | ICD-10-CM | POA: Diagnosis present

## 2022-06-08 DIAGNOSIS — E8729 Other acidosis: Secondary | ICD-10-CM | POA: Diagnosis present

## 2022-06-08 DIAGNOSIS — E872 Acidosis, unspecified: Secondary | ICD-10-CM | POA: Diagnosis present

## 2022-06-08 DIAGNOSIS — F1091 Alcohol use, unspecified, in remission: Secondary | ICD-10-CM | POA: Diagnosis present

## 2022-06-08 DIAGNOSIS — F10939 Alcohol use, unspecified with withdrawal, unspecified: Secondary | ICD-10-CM | POA: Diagnosis present

## 2022-06-08 DIAGNOSIS — Z880 Allergy status to penicillin: Secondary | ICD-10-CM | POA: Diagnosis not present

## 2022-06-08 DIAGNOSIS — I739 Peripheral vascular disease, unspecified: Secondary | ICD-10-CM | POA: Diagnosis present

## 2022-06-08 DIAGNOSIS — F1010 Alcohol abuse, uncomplicated: Secondary | ICD-10-CM | POA: Diagnosis present

## 2022-06-08 DIAGNOSIS — Z79899 Other long term (current) drug therapy: Secondary | ICD-10-CM | POA: Diagnosis not present

## 2022-06-08 DIAGNOSIS — R7401 Elevation of levels of liver transaminase levels: Secondary | ICD-10-CM | POA: Diagnosis not present

## 2022-06-08 DIAGNOSIS — F1021 Alcohol dependence, in remission: Secondary | ICD-10-CM | POA: Diagnosis present

## 2022-06-08 DIAGNOSIS — I10 Essential (primary) hypertension: Secondary | ICD-10-CM | POA: Diagnosis present

## 2022-06-08 LAB — URINE DRUG SCREEN, QUALITATIVE (ARMC ONLY)
Amphetamines, Ur Screen: NOT DETECTED
Barbiturates, Ur Screen: NOT DETECTED
Benzodiazepine, Ur Scrn: NOT DETECTED
Cannabinoid 50 Ng, Ur ~~LOC~~: NOT DETECTED
Cocaine Metabolite,Ur ~~LOC~~: NOT DETECTED
MDMA (Ecstasy)Ur Screen: NOT DETECTED
Methadone Scn, Ur: NOT DETECTED
Opiate, Ur Screen: NOT DETECTED
Phencyclidine (PCP) Ur S: NOT DETECTED
Tricyclic, Ur Screen: NOT DETECTED

## 2022-06-08 LAB — COMPREHENSIVE METABOLIC PANEL
ALT: 45 U/L — ABNORMAL HIGH (ref 0–44)
AST: 141 U/L — ABNORMAL HIGH (ref 15–41)
Albumin: 4.1 g/dL (ref 3.5–5.0)
Alkaline Phosphatase: 92 U/L (ref 38–126)
Anion gap: 21 — ABNORMAL HIGH (ref 5–15)
BUN: 8 mg/dL (ref 6–20)
CO2: 13 mmol/L — ABNORMAL LOW (ref 22–32)
Calcium: 8.8 mg/dL — ABNORMAL LOW (ref 8.9–10.3)
Chloride: 104 mmol/L (ref 98–111)
Creatinine, Ser: 0.78 mg/dL (ref 0.61–1.24)
GFR, Estimated: >60 mL/min (ref >60)
Glucose, Bld: 101 mg/dL — ABNORMAL HIGH (ref 70–99)
Potassium: 4.3 mmol/L (ref 3.5–5.1)
Sodium: 138 mmol/L (ref 135–145)
Total Bilirubin: 2.4 mg/dL — ABNORMAL HIGH (ref 0.3–1.2)
Total Protein: 8.2 g/dL — ABNORMAL HIGH (ref 6.5–8.1)

## 2022-06-08 LAB — URINALYSIS, ROUTINE W REFLEX MICROSCOPIC
Bilirubin Urine: NEGATIVE
Glucose, UA: NEGATIVE mg/dL
Ketones, ur: 20 mg/dL — AB
Leukocytes,Ua: NEGATIVE
Nitrite: NEGATIVE
Protein, ur: 30 mg/dL — AB
Specific Gravity, Urine: 1.011 (ref 1.005–1.030)
pH: 6 (ref 5.0–8.0)

## 2022-06-08 LAB — CBC
HCT: 43.9 % (ref 39.0–52.0)
Hemoglobin: 15.1 g/dL (ref 13.0–17.0)
MCH: 35.7 pg — ABNORMAL HIGH (ref 26.0–34.0)
MCHC: 34.4 g/dL (ref 30.0–36.0)
MCV: 103.8 fL — ABNORMAL HIGH (ref 80.0–100.0)
Platelets: 88 10*3/uL — ABNORMAL LOW (ref 150–400)
RBC: 4.23 MIL/uL (ref 4.22–5.81)
RDW: 15.4 % (ref 11.5–15.5)
WBC: 9.2 10*3/uL (ref 4.0–10.5)
nRBC: 0 % (ref 0.0–0.2)

## 2022-06-08 LAB — LACTIC ACID, PLASMA
Lactic Acid, Venous: 5.7 mmol/L (ref 0.5–1.9)
Lactic Acid, Venous: 2.5 mmol/L (ref 0.5–1.9)

## 2022-06-08 LAB — SAMPLE TO BLOOD BANK

## 2022-06-08 LAB — PHOSPHORUS: Phosphorus: 2.8 mg/dL (ref 2.5–4.6)

## 2022-06-08 LAB — ETHANOL: Alcohol, Ethyl (B): 68 mg/dL — ABNORMAL HIGH (ref <10)

## 2022-06-08 MED ORDER — ADULT MULTIVITAMIN W/MINERALS CH
1.0000 | ORAL_TABLET | Freq: Every day | ORAL | Status: DC
  Administered 2022-06-08 – 2022-06-16 (×9): 1 via ORAL
  Filled 2022-06-08 (×11): qty 1

## 2022-06-08 MED ORDER — LORAZEPAM 2 MG/ML IJ SOLN: 0.0000 mg | Freq: Four times a day (QID) | INTRAMUSCULAR | Status: DC

## 2022-06-08 MED ORDER — FOLIC ACID 1 MG PO TABS
1.0000 mg | ORAL_TABLET | Freq: Every day | ORAL | Status: DC
  Administered 2022-06-08 – 2022-06-16 (×9): 1 mg via ORAL
  Filled 2022-06-08 (×9): qty 1

## 2022-06-08 MED ORDER — MELATONIN 5 MG PO TABS
5.0000 mg | ORAL_TABLET | Freq: Every evening | ORAL | Status: DC | PRN
  Administered 2022-06-10 – 2022-06-12 (×4): 5 mg via ORAL
  Filled 2022-06-08 (×4): qty 1

## 2022-06-08 MED ORDER — CLONIDINE HCL 0.1 MG PO TABS
0.2000 mg | ORAL_TABLET | Freq: Three times a day (TID) | ORAL | Status: AC | PRN
  Administered 2022-06-09: 0.2 mg via ORAL
  Filled 2022-06-08: qty 2

## 2022-06-08 MED ORDER — THIAMINE MONONITRATE 100 MG PO TABS
100.0000 mg | ORAL_TABLET | Freq: Every day | ORAL | Status: DC
  Administered 2022-06-08 – 2022-06-09 (×2): 100 mg via ORAL
  Filled 2022-06-08 (×2): qty 1

## 2022-06-08 MED ORDER — THIAMINE MONONITRATE 100 MG PO TABS
100.0000 mg | ORAL_TABLET | Freq: Every day | ORAL | Status: DC
Start: 2022-06-08 — End: 2022-06-08

## 2022-06-08 MED ORDER — SODIUM CHLORIDE 0.45 % IV SOLN
INTRAVENOUS | Status: DC
  Filled 2022-06-08 (×4): qty 75

## 2022-06-08 MED ORDER — ACETAMINOPHEN 325 MG PO TABS
650.0000 mg | ORAL_TABLET | Freq: Four times a day (QID) | ORAL | Status: AC | PRN
  Administered 2022-06-08: 650 mg via ORAL
  Filled 2022-06-08: qty 2

## 2022-06-08 MED ORDER — ONDANSETRON HCL 4 MG PO TABS: 4.0000 mg | ORAL_TABLET | Freq: Four times a day (QID) | ORAL | Status: AC | PRN

## 2022-06-08 MED ORDER — SODIUM CHLORIDE 0.9 % IV BOLUS
1000.0000 mL | Freq: Once | INTRAVENOUS | Status: AC
  Administered 2022-06-08: 1000 mL via INTRAVENOUS

## 2022-06-08 MED ORDER — ONDANSETRON HCL 4 MG/2ML IJ SOLN: 4.0000 mg | Freq: Four times a day (QID) | INTRAMUSCULAR | Status: AC | PRN

## 2022-06-08 MED ORDER — DIAZEPAM 5 MG/ML IJ SOLN
10.0000 mg | Freq: Once | INTRAMUSCULAR | Status: AC
  Administered 2022-06-08: 10 mg via INTRAVENOUS
  Filled 2022-06-08: qty 2

## 2022-06-08 MED ORDER — ENOXAPARIN SODIUM 40 MG/0.4ML IJ SOSY
40.0000 mg | PREFILLED_SYRINGE | INTRAMUSCULAR | Status: DC
  Administered 2022-06-08 – 2022-06-15 (×8): 40 mg via SUBCUTANEOUS
  Filled 2022-06-08 (×8): qty 0.4

## 2022-06-08 MED ORDER — LORAZEPAM 2 MG PO TABS
0.0000 mg | ORAL_TABLET | Freq: Four times a day (QID) | ORAL | Status: DC
  Administered 2022-06-08: 2 mg via ORAL
  Filled 2022-06-08: qty 1

## 2022-06-08 MED ORDER — THIAMINE HCL 100 MG/ML IJ SOLN
100.0000 mg | Freq: Every day | INTRAMUSCULAR | Status: DC
  Administered 2022-06-08: 100 mg via INTRAVENOUS
  Filled 2022-06-08: qty 2

## 2022-06-08 MED ORDER — ACETAMINOPHEN 650 MG RE SUPP: 650.0000 mg | Freq: Four times a day (QID) | RECTAL | Status: AC | PRN

## 2022-06-08 MED ORDER — CLONIDINE HCL 0.1 MG PO TABS
0.1000 mg | ORAL_TABLET | Freq: Once | ORAL | Status: AC
  Administered 2022-06-08: 0.1 mg via ORAL
  Filled 2022-06-08: qty 1

## 2022-06-08 MED ORDER — LORAZEPAM 1 MG PO TABS
1.0000 mg | ORAL_TABLET | ORAL | Status: AC | PRN
  Administered 2022-06-08: 2 mg via ORAL
  Administered 2022-06-09: 1 mg via ORAL
  Administered 2022-06-09: 3 mg via ORAL
  Administered 2022-06-09 – 2022-06-10 (×3): 2 mg via ORAL
  Administered 2022-06-10 – 2022-06-11 (×3): 1 mg via ORAL
  Filled 2022-06-08: qty 3
  Filled 2022-06-08 (×2): qty 2
  Filled 2022-06-08 (×2): qty 1
  Filled 2022-06-08 (×2): qty 2
  Filled 2022-06-08 (×2): qty 1
  Filled 2022-06-08: qty 2

## 2022-06-08 MED ORDER — SENNOSIDES-DOCUSATE SODIUM 8.6-50 MG PO TABS: 1.0000 | ORAL_TABLET | Freq: Every evening | ORAL | Status: DC | PRN

## 2022-06-08 MED ORDER — LORAZEPAM 2 MG/ML IJ SOLN
1.0000 mg | INTRAMUSCULAR | Status: AC | PRN
  Administered 2022-06-08 – 2022-06-09 (×2): 2 mg via INTRAVENOUS
  Administered 2022-06-09 (×2): 1 mg via INTRAVENOUS
  Administered 2022-06-10: 3 mg via INTRAVENOUS
  Administered 2022-06-10: 1 mg via INTRAVENOUS
  Administered 2022-06-10 (×4): 2 mg via INTRAVENOUS
  Administered 2022-06-11 (×2): 3 mg via INTRAVENOUS
  Filled 2022-06-08: qty 2
  Filled 2022-06-08 (×4): qty 1
  Filled 2022-06-08: qty 2
  Filled 2022-06-08 (×7): qty 1

## 2022-06-08 MED ORDER — THIAMINE HCL 100 MG/ML IJ SOLN
100.0000 mg | Freq: Every day | INTRAMUSCULAR | Status: DC
Start: 2022-06-08 — End: 2022-06-08

## 2022-06-08 NOTE — H&P (Signed)
History and Physical   DEAN LUBBERS ZOX:[id number] DOB: 12/18/1970 DOA: 06/08/2022  PCP: Baldwin Jamaica, MD  Patient coming from: Home  I have personally briefly reviewed patient's old medical records in [facility] EMR.  Chief Concern: Tremors  HPI: [name] is a 52 year old male with history of alcohol abuse, hypertension, who presents emergency department for chief concerns of tremors and elevated heart rate.  Vitals in the ED showed temperature of 98.1, respiration of 18, heart rate of 121, blood pressure 151/95, SpO2 100% on room air.  Serum sodium is 138, potassium 4.8, chloride 104, bicarb 13, BUN of 8, serum creatinine of 0.78, EGFR greater than 60, nonfasting blood glucose 101, WBC 9.2, hemoglobin 15.1, platelets of 88.  Lactic acid is 5.7.  ED treatment: Valium 10 mg IV one-time dose, CIWA precaution initiated, sodium chloride 1 L bolus x 2 were given. -------------------------- At bedside, patient is able to tell me his name, age, location, current calendar year.  Patient is tremulous at bedside.  He reports that at baseline he drinks 12 cans (12 oz) beers per day.  His last drink was at 9 PM on 3/39/24.  He reports he normally starts drinking around 3 to 4 PM.  Today at approximately 10 AM, he felt tremulous while running errands.  He denies chest pain, shortness of breath, dysuria, hematuria, diarrhea, swelling of his lower extremities.  He reports he does not eat often, the last time he ate was 05/29/2022.  Social history: He lives by self.  He denies tobacco and recreational drug use.  He drinks 12 beers per day.  ROS: Constitutional: no weight change, no fever ENT/Mouth: no sore throat, no rhinorrhea Eyes: no eye pain, no vision changes Cardiovascular: no chest pain, no dyspnea,  no edema, no palpitations Respiratory: no cough, no sputum, no wheezing Gastrointestinal: no nausea, no vomiting, no diarrhea, no constipation Genitourinary: no  urinary incontinence, no dysuria, no hematuria Musculoskeletal: no arthralgias, no myalgias Skin: no skin lesions, no pruritus, Neuro: + weakness, no loss of consciousness, no syncope, + tremors Psych: no anxiety, no depression, + decrease appetite Heme/Lymph: no bruising, no bleeding  ED Course: Discussed with emergency medicine provider, patient requiring hospitalization for chief concerns of alcohol withdrawal.  Assessment/Plan  Principal Problem:   Alcohol withdrawal with inpatient treatment [facility]) Active Problems:   Alcohol withdrawal (HCC)   Alcohol abuse   Primary hypertension   PAD (peripheral artery disease) (HCC)   Transaminitis   High anion gap metabolic acidosis   Assessment and Plan:  * Alcohol withdrawal with inpatient treatment (HCC) CIWA precaution initiated Patient is status post sodium chloride 2 L bolus per EDP  Alcohol abuse Check stat phosphorus level, replace if low  Primary hypertension Clonidine 0.2 mg p.o. 3 times daily as needed for SBP greater than 170, 2 days ordered  High anion gap metabolic acidosis Presumed secondary to chronic alcohol abuse, and acute alcohol use Sodium bicarbonate IV, 75 mEq in half-normal saline, at 100 mL/h ordered  Transaminitis AST/ALT elevated by 2-1, consistent with history of alcohol use  Chart reviewed.   DVT prophylaxis: Enoxaparin Code Status: Full code Diet: Heart healthy Family Communication: Patient declines Disposition Plan: Pending clinical course Consults called: None at this time Admission status: Stepdown, inpatient  Past Medical History:  Diagnosis Date   Alcohol abuse    Past Surgical History:  Procedure Laterality Date   ELBOW SURGERY Left    Social History:  reports that he quit smoking about 4  years ago. His smoking use included cigarettes. He has never used smokeless tobacco. He reports current alcohol use of about 12.0 standard drinks of alcohol per week. He reports that he does not use  drugs.  Allergies  Allergen Reactions   Penicillins Other (See Comments)   Family History  Problem Relation Age of Onset   Alcohol abuse Neg Hx    Family history: Family history reviewed and not pertinent.  Prior to Admission medications   Medication Sig Start Date End Date Taking? Authorizing Provider  folic acid (FOLVITE) 1 MG tablet Take 1 tablet (1 mg total) by mouth daily. Patient not taking: Reported on 06/08/2022 01/01/22   Willeen Niece, MD  metoprolol succinate (TOPROL-XL) 50 MG 24 hr tablet Take 1 tablet (50 mg total) by mouth daily. Take with or immediately following a meal. Patient not taking: Reported on 06/08/2022 09/25/21   Sharman Cheek, MD  thiamine (VITAMIN B1) 100 MG tablet Take 1 tablet (100 mg total) by mouth daily. Patient not taking: Reported on 06/08/2022 12/31/21   Willeen Niece, MD   Physical Exam: Vitals:   06/08/22 1732 06/08/22 1800 06/08/22 1815 06/08/22 1819  BP: (!) 175/94 (!) 165/107    Pulse: (!) 121 (!) 126 (!) 128   Resp:  (!) 21 (!) 22   Temp:    100.3 F (37.9 C)  TempSrc:    Oral  SpO2:  97% 97%   Weight:      Height:       Constitutional: appears older than chronological age, frail, poor hygiene, tremulous, calm Eyes: PERRL, lids and conjunctivae normal ENMT: Mucous membranes are moist. Posterior pharynx clear of any exudate or lesions. Age-appropriate dentition. Hearing appropriate Neck: normal, supple, no masses, no thyromegaly Respiratory: clear to auscultation bilaterally, no wheezing, no crackles. Normal respiratory effort. No accessory muscle use.  Cardiovascular: Regular rate and rhythm, no murmurs / rubs / gallops. No extremity edema. 2+ pedal pulses. No carotid bruits.  Abdomen: no tenderness, no masses palpated, no hepatosplenomegaly. Bowel sounds positive.  Musculoskeletal: no clubbing / cyanosis. No joint deformity upper and lower extremities. Good ROM, no contractures, no atrophy. Normal muscle tone.  Skin: no rashes,  lesions, ulcers. No induration.  Bilateral lower extremity skin scratches, does not appear to be acutely infected, present on admission. Neurologic: Sensation intact. Strength 5/5 in all 4.  Bilateral upper extremity tremors.  Negative for asterixis. Psychiatric: Normal judgment and insight. Alert and oriented x 3. Normal mood.   EKG: independently reviewed, showing sinus tachycardia with rate of 126, QTc 469  Chest x-ray on Admission: I personally reviewed and I agree with radiologist reading as below.  DG Chest Port 1 View  Result Date: 06/08/2022 CLINICAL DATA:  10026 Shortness of breath 10026 EXAM: PORTABLE CHEST 1 VIEW COMPARISON:  Chest x-ray 12/23/2021 FINDINGS: The heart and mediastinal contours are unchanged. No focal consolidation. No pulmonary edema. No pleural effusion. No pneumothorax. No acute osseous abnormality. IMPRESSION: No active disease. Electronically Signed   By: Tish Frederickson M.D.   On: 06/08/2022 17:54    Labs on Admission: I have personally reviewed following labs  CBC: Recent Labs  Lab 06/08/22 1420  WBC 9.2  HGB 15.1  HCT 43.9  MCV 103.8*  PLT 88*   Basic Metabolic Panel: Recent Labs  Lab 06/08/22 1420 06/08/22 1750  NA 138  --   K 4.3  --   CL 104  --   CO2 13*  --   GLUCOSE 101*  --  BUN 8  --   CREATININE 0.78  --   CALCIUM 8.8*  --   PHOS  --  2.8   GFR: Estimated Creatinine Clearance: 128.4 mL/min (by C-G formula based on SCr of 0.78 mg/dL).  Liver Function Tests: Recent Labs  Lab 06/08/22 1420  AST 141*  ALT 45*  ALKPHOS 92  BILITOT 2.4*  PROT 8.2*  ALBUMIN 4.1   This document was prepared using Dragon Voice Recognition software and may include unintentional dictation errors.  Dr. Sedalia Muta [facility]  If 7PM-7AM, please contact overnight-coverage provider If 7AM-7PM, please contact day attending provider [email or web address]  06/08/2022, 6:27 PM

## 2022-06-08 NOTE — ED Provider Notes (Signed)
[facility] Provider Note    Event Date/Time   First MD Initiated Contact with Patient 06/08/22 1458     (approximate)   History   Alcohol Intoxication   HPI  Lawrence Rowe is a 52 y.o. male with history of alcohol abuse (drinks 10 to 12 years daily), HTN, PAD presenting to the emergency department for evaluation of shakiness.  Patient reports that around 10 this morning he had onset of tremors that made it difficult for him to walk.  He reports that he has a history of withdrawal and this feels similar.  No history of withdrawal seizures.  He does report that he has been wanting to cut back on his drinking to about 2 drinks per day, initially reports that he had been cutting back, then tells me that has been drinking his normal amount recently.  Reports his last drink was last night.  In addition he noticed some bleeding around his buttocks.  He initially calls this rectal bleeding, but when asked to clarify further yesterday reports that he had bleeding from the skin of his buttocks, no blood noted in his stool.     Physical Exam   Triage Vital Signs: ED Triage Vitals [06/08/22 1418]  Enc Vitals Group     BP (!) 151/95     Pulse Rate (!) 55     Resp 18     Temp 98.1 F (36.7 C)     Temp Source Oral     SpO2 100 %     Weight 217 lb 2.5 oz (98.5 kg)     Height 6' (1.829 m)     Head Circumference      Peak Flow      Pain Score 8     Pain Loc      Pain Edu?      Excl. in GC?     Most recent vital signs: Vitals:   06/08/22 1930 06/08/22 2200  BP: (!) 164/79 (!) 148/84  Pulse: (!) 130 (!) 113  Resp: 19 18  Temp:  99.2 F (37.3 C)  SpO2: 98% 97%     General: Awake, interactive  CV:  Tachycardic with regular rhythm, normal peripheral perfusion Resp:  Lungs clear, unlabored respirations.  Abd:  Soft, nondistended, there is significant skin breakdown in the gluteal region extending to the perianal region.  Exam is somewhat limited due  to patient discomfort.  There are visible areas of bleeding secondary to skin breakdown  Neuro:  Symmetric facial movement, moving extremity spontaneously, fluid speech, tremulous noted with tongue fasciculations present.   ED Results / Procedures / Treatments   Labs (all labs ordered are listed, but only abnormal results are displayed) Labs Reviewed  COMPREHENSIVE METABOLIC PANEL - Abnormal; Notable for the following components:      Result Value   CO2 13 (*)    Glucose, Bld 101 (*)    Calcium 8.8 (*)    Total Protein 8.2 (*)    AST 141 (*)    ALT 45 (*)    Total Bilirubin 2.4 (*)    Anion gap 21 (*)    All other components within normal limits  ETHANOL - Abnormal; Notable for the following components:   Alcohol, Ethyl (B) 68 (*)    All other components within normal limits  CBC - Abnormal; Notable for the following components:   MCV 103.8 (*)    MCH 35.7 (*)    Platelets 88 (*)  All other components within normal limits  LACTIC ACID, PLASMA - Abnormal; Notable for the following components:   Lactic Acid, Venous 5.7 (*)    All other components within normal limits  LACTIC ACID, PLASMA - Abnormal; Notable for the following components:   Lactic Acid, Venous 2.5 (*)    All other components within normal limits  URINALYSIS, ROUTINE W REFLEX MICROSCOPIC - Abnormal; Notable for the following components:   Color, Urine YELLOW (*)    APPearance HAZY (*)    Hgb urine dipstick SMALL (*)    Ketones, ur 20 (*)    Protein, ur 30 (*)    Bacteria, UA RARE (*)    All other components within normal limits  URINE DRUG SCREEN, QUALITATIVE ([facility] ONLY)  PHOSPHORUS  BASIC METABOLIC PANEL  CBC  SAMPLE TO BLOOD BANK     EKG EKG independently reviewed interpreted by myself (ER attending) demonstrates:    RADIOLOGY Imaging independently reviewed and interpreted by myself demonstrates:    PROCEDURES:  Critical Care performed: Yes, see critical care procedure note(s)  CRITICAL  CARE Performed by: Trinna Post   Total critical care time: 30 minutes  Critical care time was exclusive of separately billable procedures and treating other patients.  Critical care was necessary to treat or prevent imminent or life-threatening deterioration.  Critical care was time spent personally by me on the following activities: development of treatment plan with patient and/or surrogate as well as nursing, discussions with consultants, evaluation of patient's response to treatment, examination of patient, obtaining history from patient or surrogate, ordering and performing treatments and interventions, ordering and review of laboratory studies, ordering and review of radiographic studies, pulse oximetry and re-evaluation of patient's condition.    MEDICATIONS ORDERED IN ED: Medications  thiamine (VITAMIN B1) tablet 100 mg (100 mg Oral Given 06/08/22 1925)    Or  thiamine (VITAMIN B1) injection 100 mg ( Intravenous See Alternative 06/08/22 1925)  acetaminophen (TYLENOL) tablet 650 mg (650 mg Oral Given 06/08/22 1826)    Or  acetaminophen (TYLENOL) suppository 650 mg ( Rectal See Alternative 06/08/22 1826)  ondansetron (ZOFRAN) tablet 4 mg (has no administration in time range)    Or  ondansetron (ZOFRAN) injection 4 mg (has no administration in time range)  enoxaparin (LOVENOX) injection 40 mg (40 mg Subcutaneous Given 06/08/22 2212)  senna-docusate (Senokot-S) tablet 1 tablet (has no administration in time range)  sodium bicarbonate 75 mEq in sodium chloride 0.45 % 1,075 mL infusion ( Intravenous New Bag/Given 06/08/22 1816)  cloNIDine (CATAPRES) tablet 0.2 mg (has no administration in time range)  LORazepam (ATIVAN) tablet 1-4 mg ( Oral See Alternative 06/08/22 2040)    Or  LORazepam (ATIVAN) injection 1-4 mg (2 mg Intravenous Given 06/08/22 2040)  folic acid (FOLVITE) tablet 1 mg (1 mg Oral Given 06/08/22 1926)  multivitamin with minerals tablet 1 tablet (1 tablet Oral Given 06/08/22 2213)   melatonin tablet 5 mg (has no administration in time range)  diazepam (VALIUM) injection 10 mg (10 mg Intravenous Given 06/08/22 1545)  sodium chloride 0.9 % bolus 1,000 mL (0 mLs Intravenous Stopped 06/08/22 1630)  sodium chloride 0.9 % bolus 1,000 mL (0 mLs Intravenous Stopped 06/08/22 1753)  sodium chloride 0.9 % bolus 1,000 mL (0 mLs Intravenous Stopped 06/08/22 2033)  cloNIDine (CATAPRES) tablet 0.1 mg (0.1 mg Oral Given 06/08/22 1943)     IMPRESSION / MDM / ASSESSMENT AND PLAN / ED COURSE  I reviewed the triage vital signs and the nursing notes.  Differential diagnosis includes, but is not limited to, alcohol withdrawal, anemia, electrolyte abnormality, clinical history and physical exam are much less suggestive of a GI bleed, seems to be bleeding due to skin breakdown  Patient's presentation is most consistent with acute presentation with potential threat to life or bodily function.  52 year old male presenting to the emergency department with tremors and skin breakdown around his gluteal region.  Somewhat unclear if patient has been cutting back on his drinking based on his clinical history.  However he is tachycardic with tremulousness and tongue fasciculations here suggestive of withdrawal symptoms.  I have ordered him for 10 mg of Valium. His hemoglobin is actually improved from to treat withdrawal symptoms and initiated CIWA protocol including thiamine.  His lab work here demonstrates normal white blood cell count and hemoglobin prior.  Do not suspect significant acute bleeding loss.  EtOH 68.  His CMP does demonstrate a significant anion gap metabolic acidosis with a bicarb of 13 and anion gap of 21.  Glucose of 101, I am concerned about alcoholic ketoacidosis.  Will order lactate to further assess, but do think patient will likely require admission given his significant acid-base abnormality.  Clinical Course as of 06/09/22 0018  Thu Jun 08, 2022  1702 Lactic Acid, Venous(!!):  5.7 Significant lactic acidosis at 5.7.  Suspect that this may be related to poor clearance in the setting of his alcohol use.  Patient denies any seizure-like activity, but consideration for this in the setting of withdrawal.  He has not had any fevers, has a normal white blood cell count, is not reporting any acute infectious symptoms.  I do not feel that this is a presentation of septic shock.  Will order another liter of fluid and reach out to hospitalist team to discuss admission. [NR]    Clinical Course User Index [NR] Trinna Post, MD     FINAL CLINICAL IMPRESSION(S) / ED DIAGNOSES   Final diagnoses:  Alcohol withdrawal syndrome with complication (HCC)  Lactic acidosis     Rx / DC Orders   ED Discharge Orders     None        Note:  This document was prepared using Dragon voice recognition software and may include unintentional dictation errors.   Trinna Post, MD 06/09/22 [phone]

## 2022-06-08 NOTE — ED Triage Notes (Signed)
Arrives from home via [facility].  Hx of ETOH abuse.  Last drink last night at 2100.  Running errands today and at around 1000 this morning, having tremors and was unable to walk.  Per report, patient possibly incontinent of stool and urine.    HR  130-140.  Has history of DT's and patient reports having DT's in the past and states this feels similar.

## 2022-06-08 NOTE — Assessment & Plan Note (Addendum)
Patient was on metoprolol at home which he was not taking. -Restart home metoprolol Clonidine 0.2 mg p.o. 3 times daily as needed for SBP greater than 170, 2 days ordered

## 2022-06-08 NOTE — Assessment & Plan Note (Addendum)
Phosphorus was normal.  Counseling was provided.

## 2022-06-08 NOTE — Assessment & Plan Note (Addendum)
CIWA score of 10 this morning, patient remained little tremulous. -Continue with CIWA protocol

## 2022-06-08 NOTE — ED Notes (Signed)
Pt removed oxygen at this time, VSS. WCTM.

## 2022-06-08 NOTE — Assessment & Plan Note (Addendum)
Resolved with bicarb infusion.  Likely secondary to alcohol abuse -Stop bicarb -Continue to

## 2022-06-08 NOTE — ED Notes (Signed)
Report given to [facility]

## 2022-06-08 NOTE — ED Notes (Signed)
Pt desatting to 85% s/p valium admin. Pt placed on 2L [place] with marked improvement. WCTM.

## 2022-06-08 NOTE — ED Triage Notes (Signed)
Pt here via [facility] with tremors. Pt has a hx of using alcohol, last drink was last night. Pt has a hx of DT, states this is similar. Pt also states he has trouble walking and he has some rectal bleeding. Pt denies abd pain. Pt is incontinent of urine and stool.

## 2022-06-08 NOTE — Assessment & Plan Note (Signed)
AST/ALT elevated by 2-1, consistent with history of alcohol use

## 2022-06-08 NOTE — Hospital Course (Addendum)
[name] is a 52 year old male with history of alcohol abuse, hypertension, who presents emergency department for chief concerns of tremors and elevated heart rate.  He normally drinks on a daily basis, last drink was 06/07/2022.  Drinks about 12 beers per day.  Vitals in the ED showed temperature of 98.1, respiration of 18, heart rate of 121, blood pressure 151/95, SpO2 100% on room air.  Serum sodium is 138, potassium 4.8, chloride 104, bicarb 13, BUN of 8, serum creatinine of 0.78, EGFR greater than 60, nonfasting blood glucose 101, WBC 9.2, hemoglobin 15.1, platelets of 88.  Lactic acid is 5.7.  ED treatment: Valium 10 mg IV one-time dose, CIWA precaution initiated, sodium chloride 1 L bolus x 2 were given.  5/31: Blood pressure mildly elevated at 169/93, persistent mild tachycardia.  Labs with platelets of 60 but all cell lines decreased so most likely some dilutional effect.  Macrocytosis.  Mild hypokalemia which is being repleted.  CIWA score of 10.  6/1: Vital stable, overnight becoming more confused and agitated.  Worsening ataxia.  Concern of alcohol related delirium and/or [place].  Starting him on phenobarbital taper and high-dose thiamine.  Sitter at bedside.  PT ordered.  6/2: Patient remained ataxic and little shaky.  No hallucinations or confabulation.  CIWA of 17  6/3: Hemodynamically stable and overall improving, CIWA score of 2.  Original PT recommendations were to go to SNF but patient wants to go home stating that his ability to walk is improving.  Message sent to PT to continue working with him.  Patient need to stay in hospital until Friday to complete high-dose thiamine. Patient does not want to involve any family member at this time.  6/4: Continue to improve, planning to walk without walker tomorrow.  Would like to go home instead of rehab.  Will finish high-dose thiamine on Friday.

## 2022-06-08 NOTE — ED Notes (Signed)
Pt taken to room and cleaned up by this Clinical research associate and [place], NT and [place], [place]. Pt had soiled himself last night and was unable to get up to clean himself. Pt has tremors but talking in complete sentences. Pt also bleeding from the skin of his buttocks as if he fell but pt denies fall. Brief placed on pt.

## 2022-06-09 ENCOUNTER — Encounter: Payer: Self-pay | Admitting: Internal Medicine

## 2022-06-09 DIAGNOSIS — E8729 Other acidosis: Secondary | ICD-10-CM

## 2022-06-09 DIAGNOSIS — R7401 Elevation of levels of liver transaminase levels: Secondary | ICD-10-CM

## 2022-06-09 DIAGNOSIS — I10 Essential (primary) hypertension: Secondary | ICD-10-CM

## 2022-06-09 LAB — CBC
HCT: 38.1 % — ABNORMAL LOW (ref 39.0–52.0)
Hemoglobin: 13.2 g/dL (ref 13.0–17.0)
MCH: 36 pg — ABNORMAL HIGH (ref 26.0–34.0)
MCHC: 34.6 g/dL (ref 30.0–36.0)
MCV: 103.8 fL — ABNORMAL HIGH (ref 80.0–100.0)
Platelets: 60 10*3/uL — ABNORMAL LOW (ref 150–400)
RBC: 3.67 MIL/uL — ABNORMAL LOW (ref 4.22–5.81)
RDW: 15.4 % (ref 11.5–15.5)
WBC: 5.1 10*3/uL (ref 4.0–10.5)
nRBC: 0 % (ref 0.0–0.2)

## 2022-06-09 LAB — BASIC METABOLIC PANEL
Anion gap: 11 (ref 5–15)
BUN: 11 mg/dL (ref 6–20)
CO2: 22 mmol/L (ref 22–32)
Calcium: 8.2 mg/dL — ABNORMAL LOW (ref 8.9–10.3)
Chloride: 106 mmol/L (ref 98–111)
Creatinine, Ser: 0.65 mg/dL (ref 0.61–1.24)
GFR, Estimated: >60 mL/min (ref >60)
Glucose, Bld: 107 mg/dL — ABNORMAL HIGH (ref 70–99)
Potassium: 3.4 mmol/L — ABNORMAL LOW (ref 3.5–5.1)
Sodium: 139 mmol/L (ref 135–145)

## 2022-06-09 LAB — MAGNESIUM: Magnesium: 2.1 mg/dL (ref 1.7–2.4)

## 2022-06-09 MED ORDER — METOPROLOL SUCCINATE ER 50 MG PO TB24
50.0000 mg | ORAL_TABLET | Freq: Every day | ORAL | Status: DC
  Administered 2022-06-09 – 2022-06-16 (×8): 50 mg via ORAL
  Filled 2022-06-09 (×8): qty 1

## 2022-06-09 MED ORDER — MELATONIN 5 MG PO TABS: 5.0000 mg | ORAL_TABLET | Freq: Every day | ORAL | Status: DC

## 2022-06-09 MED ORDER — POLYETHYLENE GLYCOL 3350 17 G PO PACK
17.0000 g | PACK | Freq: Every day | ORAL | Status: DC
  Administered 2022-06-09 – 2022-06-16 (×5): 17 g via ORAL
  Filled 2022-06-09 (×6): qty 1

## 2022-06-09 MED ORDER — POTASSIUM CHLORIDE CRYS ER 20 MEQ PO TBCR
40.0000 meq | EXTENDED_RELEASE_TABLET | Freq: Once | ORAL | Status: AC
  Administered 2022-06-09: 40 meq via ORAL
  Filled 2022-06-09: qty 2

## 2022-06-09 NOTE — Discharge Instructions (Addendum)
                  Intensive Outpatient Programs  [facility] [place] [facility]    [facility] [address]     [address] [place],  [place]     [place], [place] [phone]      [phone]  [facility]   [facility]  (Inpatient and outpatient)  [phone] (Suboxone and Methadone) [address]           [phone]           [facility]    [facility] - Intensive Outpatient [address]     [address] [place], [place] [address]     [place], [place]  [phone]      [phone]  [facility] (Outpatient, Inpatient, [facility]  [facility] (Groups and [facility]) (insurance only) [phone]    [place], [place]          [phone]       [facility]    [facility] (for caregivers and family) [address]     [address] [place], [place]     [place], [place] [phone]      [phone]  Residential Treatment Programs  [facility]  [facility](2 years) Residential: 90 days)  [facility] [address]      [address] [place], [place]     [place], [place] [phone]      [phone] or [phone]  [facility]    [facility] [address]      [address] [place], [place]     [place], [place] [phone]      [phone]  [facility]   [facility] ([facility]) [address]     [address] [place], [place] [address]     [place], [place] [phone]      [phone] Admissions: 8am-3pm M-F  [facility]: Residential Program (90 Days)              [facility]: [facility]  [place], [place]     [place], [place]  [phone] or [phone]    (Walk in Hours over the weekend or by referral)   [facility]: [facility]:[phone] (for crisis  response 24 hours a day) 

## 2022-06-09 NOTE — ED Notes (Signed)
Pt refused ativan at this time and stated he felt fine- Pt is stable

## 2022-06-09 NOTE — ED Notes (Signed)
Patient assisted with urinal. This RN rushed into patient room as bed alarm sounded due to patient getting out of bed without using call bell to call out for assistance. Patient observed to be confused as to current situation. Patient reoriented to room and situation, patient assisted back into bed, and cardiac leads with pulse ox replaced. Patient bed noticeably wet and patient sweating, this RN changed patient linens, performed CIWAA screening and administered ativan 3mg  for CIWAA 17

## 2022-06-09 NOTE — TOC Initial Note (Signed)
Transition of Care [facility]) - Initial/Assessment Note    Patient Details  Name: Lawrence Rowe MRN: [id number] Date of Birth: Feb 22, 1970  Transition of Care [facility]) CM/SW Contact:    Darolyn Rua, LCSW Phone Number: 06/09/2022, 3:42 PM  Clinical Narrative:                   Transition of Care [facility]) Screening Note   Patient Details  Name: Lawrence Rowe Date of Birth: [birth year]   Transition of Care [facility]) CM/SW Contact:    Darolyn Rua, LCSW Phone Number: 06/09/2022, 3:43 PM  SA resources on AVS.  Transition of Care Department [facility]) has reviewed patient and no TOC needs have been identified at this time. We will continue to monitor patient advancement through interdisciplinary progression rounds. If new patient transition needs arise, please place a TOC consult.         Patient Goals and CMS Choice            Expected Discharge Plan and Services                                              Prior Living Arrangements/Services                       Activities of Daily Living Home Assistive Devices/Equipment: None ADL Screening (condition at time of admission) Patient's cognitive ability adequate to safely complete daily activities?: Yes Is the patient deaf or have difficulty hearing?: No Does the patient have difficulty seeing, even when wearing glasses/contacts?: No Does the patient have difficulty concentrating, remembering, or making decisions?: No Patient able to express need for assistance with ADLs?: Yes Does the patient have difficulty dressing or bathing?: No Independently performs ADLs?: Yes (appropriate for developmental age) Does the patient have difficulty walking or climbing stairs?: No Weakness of Legs: None Weakness of Arms/Hands: None  Permission Sought/Granted                  Emotional Assessment              Admission diagnosis:  Alcohol withdrawal with inpatient treatment [facility])  [F10.939] Patient Active Problem List   Diagnosis Date Noted   Alcohol withdrawal with inpatient treatment (HCC) 06/08/2022   High anion gap metabolic acidosis 06/08/2022   Chronic diastolic CHF (congestive heart failure) (HCC) 01/13/2022   Overweight (BMI 25.0-29.9) 01/12/2022   Fatty liver 01/12/2022   Abnormal liver function 01/11/2022   Wernicke encephalopathy syndrome 12/27/2021   Delirium tremens (HCC) 12/27/2021   Alcoholic hepatitis 12/27/2021   Alcohol withdrawal (HCC) 12/23/2021   Transaminitis 12/23/2021   Unilateral edema of lower extremity 12/23/2021   Dermatitis 05/10/2021   Primary hypertension 05/10/2021   Tachycardia with hypertension 04/19/2021   PAD (peripheral artery disease) (HCC) 04/19/2021   Alcohol abuse 04/19/2021   Metabolic syndrome 04/19/2021   PCP:  Baldwin Jamaica, MD Pharmacy:   [facility] - [place], [place] - [address] [place] [address] Phone: [phone] Fax: [phone]     Social Determinants of Health (SDOH) Social History: SDOH Screenings   Food Insecurity: No Food Insecurity (06/09/2022)  Housing: Low Risk  (06/09/2022)  Transportation Needs: No Transportation Needs (06/09/2022)  Utilities: Not At Risk (06/09/2022)  Depression (PHQ2-9): Low Risk  (04/19/2021)  Tobacco Use: Medium Risk (06/09/2022)  SDOH Interventions:     Readmission Risk Interventions     No data to display

## 2022-06-09 NOTE — Progress Notes (Signed)
  Progress Note   Patient: Lawrence Rowe:[id number] DOB: 1970-12-20 DOA: 06/08/2022     1 DOS: the patient was seen and examined on 06/09/2022   Brief hospital course: Mr. Trevione Darko is a 52 year old male with history of alcohol abuse, hypertension, who presents emergency department for chief concerns of tremors and elevated heart rate.  He normally drinks on a daily basis, last drink was 06/07/2022.  Drinks about 12 beers per day.  Vitals in the ED showed temperature of 98.1, respiration of 18, heart rate of 121, blood pressure 151/95, SpO2 100% on room air.  Serum sodium is 138, potassium 4.8, chloride 104, bicarb 13, BUN of 8, serum creatinine of 0.78, EGFR greater than 60, nonfasting blood glucose 101, WBC 9.2, hemoglobin 15.1, platelets of 88.  Lactic acid is 5.7.  ED treatment: Valium 10 mg IV one-time dose, CIWA precaution initiated, sodium chloride 1 L bolus x 2 were given.  5/31: Blood pressure mildly elevated at 169/93, persistent mild tachycardia.  Labs with platelets of 60 but all cell lines decreased so most likely some dilutional effect.  Macrocytosis.  Mild hypokalemia which is being repleted.  CIWA score of 10.  Assessment and Plan: * Alcohol withdrawal with inpatient treatment (HCC) CIWA score of 10 this morning, patient remained little tremulous. -Continue with CIWA protocol  Alcohol abuse Phosphorus was normal.  Counseling was provided.  Primary hypertension Patient was on metoprolol at home which he was not taking. -Restart home metoprolol Clonidine 0.2 mg p.o. 3 times daily as needed for SBP greater than 170, 2 days ordered  High anion gap metabolic acidosis Resolved with bicarb infusion.  Likely secondary to alcohol abuse -Stop bicarb -Continue to  Transaminitis AST/ALT elevated by 2-1, consistent with history of alcohol use   Subjective: Patient was still little tremulous when seen today.  Denies any pain.  Physical Exam: Vitals:    06/09/22 0400 06/09/22 0433 06/09/22 0946 06/09/22 1200  BP: (!) 169/93  (!) 164/106 (!) 163/100  Pulse: (!) 113  (!) 106 (!) 104  Resp: 15  15 14   Temp:  (!) 97.5 F (36.4 C) 99 F (37.2 C)   TempSrc:  Oral Oral   SpO2: 98%  97% 91%  Weight:      Height:       General.  Well-developed gentleman, in no acute distress. Pulmonary.  Lungs clear bilaterally, normal respiratory effort. CV.  Regular rate and rhythm, mild tachycardia Abdomen.  Soft, nontender, nondistended, BS positive. CNS.  Alert and oriented .  No focal neurologic deficit. Extremities.  No edema, no cyanosis, pulses intact and symmetrical. Psychiatry.  Judgment and insight appears normal.   Data Reviewed: Prior data reviewed  Family Communication: Discussed with patient  Disposition: Status is: Inpatient Remains inpatient appropriate because: Severity of illness  Planned Discharge Destination: Home  DVT prophylaxis.  Lovenox Time spent: 45 minutes  This record has been created using Conservation officer, historic buildings. Errors have been sought and corrected,but may not always be located. Such creation errors do not reflect on the standard of care.   Author: Arnetha Courser, MD 06/09/2022 2:36 PM  For on call review [email or web address].

## 2022-06-09 NOTE — ED Notes (Signed)
No sodium bicarb running when this RN took over patient

## 2022-06-10 DIAGNOSIS — E8729 Other acidosis: Secondary | ICD-10-CM

## 2022-06-10 DIAGNOSIS — I10 Essential (primary) hypertension: Secondary | ICD-10-CM

## 2022-06-10 DIAGNOSIS — R7401 Elevation of levels of liver transaminase levels: Secondary | ICD-10-CM

## 2022-06-10 LAB — BASIC METABOLIC PANEL
Anion gap: 11 (ref 5–15)
BUN: 9 mg/dL (ref 6–20)
CO2: 21 mmol/L — ABNORMAL LOW (ref 22–32)
Calcium: 8.6 mg/dL — ABNORMAL LOW (ref 8.9–10.3)
Chloride: 101 mmol/L (ref 98–111)
Creatinine, Ser: 0.63 mg/dL (ref 0.61–1.24)
GFR, Estimated: >60 mL/min (ref >60)
Glucose, Bld: 121 mg/dL — ABNORMAL HIGH (ref 70–99)
Potassium: 3.7 mmol/L (ref 3.5–5.1)
Sodium: 133 mmol/L — ABNORMAL LOW (ref 135–145)

## 2022-06-10 LAB — CBC
HCT: 38 % — ABNORMAL LOW (ref 39.0–52.0)
Hemoglobin: 13 g/dL (ref 13.0–17.0)
MCH: 34.9 pg — ABNORMAL HIGH (ref 26.0–34.0)
MCHC: 34.2 g/dL (ref 30.0–36.0)
MCV: 102.2 fL — ABNORMAL HIGH (ref 80.0–100.0)
Platelets: 65 10*3/uL — ABNORMAL LOW (ref 150–400)
RBC: 3.72 MIL/uL — ABNORMAL LOW (ref 4.22–5.81)
RDW: 14.3 % (ref 11.5–15.5)
WBC: 4.3 10*3/uL (ref 4.0–10.5)
nRBC: 0 % (ref 0.0–0.2)

## 2022-06-10 LAB — LACTIC ACID, PLASMA: Lactic Acid, Venous: 1.2 mmol/L (ref 0.5–1.9)

## 2022-06-10 MED ORDER — LOSARTAN POTASSIUM 25 MG PO TABS
25.0000 mg | ORAL_TABLET | Freq: Every day | ORAL | Status: DC
  Administered 2022-06-10 – 2022-06-12 (×3): 25 mg via ORAL
  Filled 2022-06-10 (×3): qty 1

## 2022-06-10 MED ORDER — SODIUM CHLORIDE 0.9 % IV SOLN: INTRAVENOUS | Status: DC | PRN

## 2022-06-10 MED ORDER — PHENOBARBITAL SODIUM 130 MG/ML IJ SOLN
97.5000 mg | Freq: Three times a day (TID) | INTRAMUSCULAR | Status: AC
  Administered 2022-06-10 – 2022-06-12 (×6): 97.5 mg via INTRAVENOUS
  Filled 2022-06-10 (×6): qty 1

## 2022-06-10 MED ORDER — THIAMINE HCL 100 MG/ML IJ SOLN
500.0000 mg | Freq: Three times a day (TID) | INTRAVENOUS | Status: AC
  Administered 2022-06-10 – 2022-06-11 (×5): 500 mg via INTRAVENOUS
  Filled 2022-06-10 (×5): qty 5

## 2022-06-10 MED ORDER — PHENOBARBITAL SODIUM 65 MG/ML IJ SOLN
65.0000 mg | Freq: Three times a day (TID) | INTRAMUSCULAR | Status: AC
  Administered 2022-06-12 – 2022-06-14 (×6): 65 mg via INTRAVENOUS
  Filled 2022-06-10 (×6): qty 1

## 2022-06-10 MED ORDER — PHENOBARBITAL SODIUM 65 MG/ML IJ SOLN
32.5000 mg | Freq: Three times a day (TID) | INTRAMUSCULAR | Status: AC
  Administered 2022-06-14 – 2022-06-16 (×6): 32.5 mg via INTRAVENOUS
  Filled 2022-06-10 (×6): qty 1

## 2022-06-10 MED ORDER — SODIUM CHLORIDE 0.9 % IV SOLN
260.0000 mg | Freq: Once | INTRAVENOUS | Status: AC
  Administered 2022-06-10: 260 mg via INTRAVENOUS
  Filled 2022-06-10: qty 2

## 2022-06-10 MED ORDER — THIAMINE HCL 100 MG/ML IJ SOLN
250.0000 mg | INTRAVENOUS | Status: AC
  Administered 2022-06-12 – 2022-06-16 (×5): 250 mg via INTRAVENOUS
  Filled 2022-06-10 (×5): qty 2.5

## 2022-06-10 MED ORDER — THIAMINE HCL 100 MG/ML IJ SOLN: 100.0000 mg | INTRAMUSCULAR | Status: DC

## 2022-06-10 NOTE — ED Notes (Signed)
ED TO INPATIENT HANDOFF REPORT  ED Nurse Name and Phone #: Al Corpus, RN [phone]  S Name/Age/Gender [name] 52 y.o. male Room/Bed: ED33A/ED33A  Code Status   Code Status: Full Code  Home/SNF/Other Home Patient oriented to: self Is this baseline? No   Triage Complete: Triage complete  Chief Complaint Alcohol withdrawal with inpatient treatment [facility]) [F10.939] Alcohol withdrawal (HCC) [F10.939]  Triage Note Arrives from home via [facility].  Hx of ETOH abuse.  Last drink last night at 2100.  Running errands today and at around 1000 this morning, having tremors and was unable to walk.  Per report, patient possibly incontinent of stool and urine.    HR  130-140.  Has history of DT's and patient reports having DT's in the past and states this feels similar.  Pt here via [facility] with tremors. Pt has a hx of using alcohol, last drink was last night. Pt has a hx of DT, states this is similar. Pt also states he has trouble walking and he has some rectal bleeding. Pt denies abd pain. Pt is incontinent of urine and stool.    Allergies Allergies  Allergen Reactions   Penicillins Other (See Comments)    Level of Care/Admitting Diagnosis ED Disposition     ED Disposition  Admit   Condition  --   Comment  Hospital Area: [facility] [[id number]]  Level of Care: Progressive [102]  Admit to Progressive based on following criteria: ACUTE MENTAL DISORDER-RELATED Drug/Alcohol Ingestion/Overdose/Withdrawal, Suicidal Ideation/attempt requiring safety sitter and < Q2h monitoring/assessments, moderate to severe agitation that is managed with medication/sitter, CIWA-Ar score < 20.  Covid Evaluation: Asymptomatic - no recent exposure (last 10 days) testing not required  Diagnosis: Alcohol withdrawal (HCC) [291.81.ICD-9-CM]  Admitting Physician: Arnetha Courser [[id number]]  Attending Physician: Arnetha Courser [phone]  Certification:: I certify this patient will need  inpatient services for at least 2 midnights          B Medical/Surgery History Past Medical History:  Diagnosis Date   Alcohol abuse    Past Surgical History:  Procedure Laterality Date   ELBOW SURGERY Left      A IV Location/Drains/Wounds Patient Lines/Drains/Airways Status     Active Line/Drains/Airways     Name Placement date Placement time Site Days   Peripheral IV 06/08/22 18 G Posterior;Right Forearm 06/08/22  1545  Forearm  2            Intake/Output Last 24 hours  Intake/Output Summary (Last 24 hours) at 06/10/2022 1415 Last data filed at 06/10/2022 1058 Gross per 24 hour  Intake 150 ml  Output 2375 ml  Net -2225 ml    Labs/Imaging Results for orders placed or performed during the hospital encounter of 06/08/22 (from the past 48 hour(s))  Comprehensive metabolic panel     Status: Abnormal   Collection Time: 06/08/22  2:20 PM  Result Value Ref Range   Sodium 138 135 - 145 mmol/L    Comment: ELECTROLYTES REPEATED TO VERIFY MU   Potassium 4.3 3.5 - 5.1 mmol/L   Chloride 104 98 - 111 mmol/L   CO2 13 (L) 22 - 32 mmol/L   Glucose, Bld 101 (H) 70 - 99 mg/dL    Comment: Glucose reference range applies only to samples taken after fasting for at least 8 hours.   BUN 8 6 - 20 mg/dL   Creatinine, Ser 5.78 0.61 - 1.24 mg/dL   Calcium 8.8 (L) 8.9 - 10.3 mg/dL   Total Protein 8.2 (  H) 6.5 - 8.1 g/dL   Albumin 4.1 3.5 - 5.0 g/dL   AST 914 (H) 15 - 41 U/L   ALT 45 (H) 0 - 44 U/L   Alkaline Phosphatase 92 38 - 126 U/L   Total Bilirubin 2.4 (H) 0.3 - 1.2 mg/dL   GFR, Estimated >78 >29 mL/min    Comment: (NOTE) Calculated using the CKD-EPI Creatinine Equation (2021)    Anion gap 21 (H) 5 - 15    Comment: Performed at [facility], [address] [place], [place] [address]  Ethanol     Status: Abnormal   Collection Time: 06/08/22  2:20 PM  Result Value Ref Range   Alcohol, Ethyl (B) 68 (H) <10 mg/dL    Comment: (NOTE) Lowest detectable limit for  serum alcohol is 10 mg/dL.  For medical purposes only. Performed at [facility], [address] [place], [place] [address]   cbc     Status: Abnormal   Collection Time: 06/08/22  2:20 PM  Result Value Ref Range   WBC 9.2 4.0 - 10.5 K/uL   RBC 4.23 4.22 - 5.81 MIL/uL   Hemoglobin 15.1 13.0 - 17.0 g/dL   HCT 84.6 96.2 - 95.2 %   MCV 103.8 (H) 80.0 - 100.0 fL   MCH 35.7 (H) 26.0 - 34.0 pg   MCHC 34.4 30.0 - 36.0 g/dL   RDW 84.1 32.4 - 40.1 %   Platelets 88 (L) 150 - 400 K/uL    Comment: Immature Platelet Fraction may be clinically indicated, consider ordering this additional test [id number]    nRBC 0.0 0.0 - 0.2 %    Comment: Performed at [facility], [address] [place], [place] [address]  Sample to Blood Bank     Status: None   Collection Time: 06/08/22  2:22 PM  Result Value Ref Range   Blood Bank Specimen SAMPLE AVAILABLE FOR TESTING    Sample Expiration      06/11/2022,2359 Performed at [facility], [address] [place], [place] [address]   Lactic acid, plasma     Status: Abnormal   Collection Time: 06/08/22  3:52 PM  Result Value Ref Range   Lactic Acid, Venous 5.7 (HH) 0.5 - 1.9 mmol/L    Comment: CRITICAL RESULT CALLED TO, READ BACK BY AND VERIFIED WITH JESSICA MADISSON AT 1627 ON 06/08/22 BY SS Performed at [facility], [address] [place], [place] [address]   Urine Drug Screen, Qualitative     Status: None   Collection Time: 06/08/22  4:39 PM  Result Value Ref Range   Tricyclic, Ur Screen NONE DETECTED NONE DETECTED   Amphetamines, Ur Screen NONE DETECTED NONE DETECTED   MDMA (Ecstasy)Ur Screen NONE DETECTED NONE DETECTED   Cocaine Metabolite,Ur [place] NONE DETECTED NONE DETECTED   Opiate, Ur Screen NONE DETECTED NONE DETECTED   Phencyclidine (PCP) Ur S NONE DETECTED NONE DETECTED   Cannabinoid 50 Ng, Ur [place] NONE DETECTED NONE DETECTED   Barbiturates, Ur Screen NONE DETECTED NONE DETECTED   Benzodiazepine, Ur  Scrn NONE DETECTED NONE DETECTED   Methadone Scn, Ur NONE DETECTED NONE DETECTED    Comment: (NOTE) Tricyclics + metabolites, urine    Cutoff 1000 ng/mL Amphetamines + metabolites, urine  Cutoff 1000 ng/mL MDMA (Ecstasy), urine              Cutoff 500 ng/mL Cocaine Metabolite, urine          Cutoff 300 ng/mL Opiate + metabolites,  urine        Cutoff 300 ng/mL Phencyclidine (PCP), urine         Cutoff 25 ng/mL Cannabinoid, urine                 Cutoff 50 ng/mL Barbiturates + metabolites, urine  Cutoff 200 ng/mL Benzodiazepine, urine              Cutoff 200 ng/mL Methadone, urine                   Cutoff 300 ng/mL  The urine drug screen provides only a preliminary, unconfirmed analytical test result and should not be used for non-medical purposes. Clinical consideration and professional judgment should be applied to any positive drug screen result due to possible interfering substances. A more specific alternate chemical method must be used in order to obtain a confirmed analytical result. Gas chromatography / mass spectrometry (GC/MS) is the preferred confirm atory method. Performed at [facility], [address] [place], [place] [address]   Urinalysis, Routine w reflex microscopic -Urine, Clean Catch     Status: Abnormal   Collection Time: 06/08/22  4:39 PM  Result Value Ref Range   Color, Urine YELLOW (A) YELLOW   APPearance HAZY (A) CLEAR   Specific Gravity, Urine 1.011 1.005 - 1.030   pH 6.0 5.0 - 8.0   Glucose, UA NEGATIVE NEGATIVE mg/dL   Hgb urine dipstick SMALL (A) NEGATIVE   Bilirubin Urine NEGATIVE NEGATIVE   Ketones, ur 20 (A) NEGATIVE mg/dL   Protein, ur 30 (A) NEGATIVE mg/dL   Nitrite NEGATIVE NEGATIVE   Leukocytes,Ua NEGATIVE NEGATIVE   RBC / HPF 0-5 0 - 5 RBC/hpf   WBC, UA 0-5 0 - 5 WBC/hpf   Bacteria, UA RARE (A) NONE SEEN   Squamous Epithelial / HPF 0-5 0 - 5 /HPF   Mucus PRESENT     Comment: Performed at [facility], [address] [place], [place] [address]  Lactic acid, plasma     Status: Abnormal   Collection Time: 06/08/22  5:50 PM  Result Value Ref Range   Lactic Acid, Venous 2.5 (HH) 0.5 - 1.9 mmol/L    Comment: CRITICAL VALUE NOTED. VALUE IS CONSISTENT WITH PREVIOUSLY REPORTED/CALLED VALUE SS Performed at [facility], [address] [place], [place] [address]   Phosphorus     Status: None   Collection Time: 06/08/22  5:50 PM  Result Value Ref Range   Phosphorus 2.8 2.5 - 4.6 mg/dL    Comment: Performed at [facility], [address] [place], [place] [address]  Basic metabolic panel     Status: Abnormal   Collection Time: 06/09/22  4:19 AM  Result Value Ref Range   Sodium 139 135 - 145 mmol/L   Potassium 3.4 (L) 3.5 - 5.1 mmol/L   Chloride 106 98 - 111 mmol/L   CO2 22 22 - 32 mmol/L   Glucose, Bld 107 (H) 70 - 99 mg/dL    Comment: Glucose reference range applies only to samples taken after fasting for at least 8 hours.   BUN 11 6 - 20 mg/dL   Creatinine, Ser 2.95 0.61 - 1.24 mg/dL   Calcium 8.2 (L) 8.9 - 10.3 mg/dL   GFR, Estimated >62 >13 mL/min    Comment: (NOTE) Calculated using the CKD-EPI Creatinine Equation (2021)    Anion gap 11 5 - 15    Comment: Performed at [facility], [address]  [place], [place] [address]  CBC     Status: Abnormal   Collection Time: 06/09/22  4:19 AM  Result Value Ref Range   WBC 5.1 4.0 - 10.5 K/uL   RBC 3.67 (L) 4.22 - 5.81 MIL/uL   Hemoglobin 13.2 13.0 - 17.0 g/dL   HCT 60.4 (L) 54.0 - 98.1 %   MCV 103.8 (H) 80.0 - 100.0 fL   MCH 36.0 (H) 26.0 - 34.0 pg   MCHC 34.6 30.0 - 36.0 g/dL   RDW 19.1 47.8 - 29.5 %   Platelets 60 (L) 150 - 400 K/uL    Comment: Immature Platelet Fraction may be clinically indicated, consider ordering this additional test [id number] PLATELET COUNT CONFIRMED BY SMEAR    nRBC 0.0 0.0 - 0.2 %    Comment: Performed at [facility], [address], [place], [place] [address]  Magnesium      Status: None   Collection Time: 06/09/22  4:19 AM  Result Value Ref Range   Magnesium 2.1 1.7 - 2.4 mg/dL    Comment: Performed at [facility], [address] [place], [place] [address]  Lactic acid, plasma     Status: None   Collection Time: 06/10/22  9:35 AM  Result Value Ref Range   Lactic Acid, Venous 1.2 0.5 - 1.9 mmol/L    Comment: Performed at [facility], [address] [place], [place] [address]  Basic metabolic panel     Status: Abnormal   Collection Time: 06/10/22  9:35 AM  Result Value Ref Range   Sodium 133 (L) 135 - 145 mmol/L   Potassium 3.7 3.5 - 5.1 mmol/L   Chloride 101 98 - 111 mmol/L   CO2 21 (L) 22 - 32 mmol/L   Glucose, Bld 121 (H) 70 - 99 mg/dL    Comment: Glucose reference range applies only to samples taken after fasting for at least 8 hours.   BUN 9 6 - 20 mg/dL   Creatinine, Ser 4.40 0.61 - 1.24 mg/dL   Calcium 8.6 (L) 8.9 - 10.3 mg/dL   GFR, Estimated >10 >27 mL/min    Comment: (NOTE) Calculated using the CKD-EPI Creatinine Equation (2021)    Anion gap 11 5 - 15    Comment: Performed at [facility], [address] [place], [place] [address]  CBC     Status: Abnormal   Collection Time: 06/10/22  9:35 AM  Result Value Ref Range   WBC 4.3 4.0 - 10.5 K/uL   RBC 3.72 (L) 4.22 - 5.81 MIL/uL   Hemoglobin 13.0 13.0 - 17.0 g/dL   HCT 44.0 (L) 34.7 - 42.5 %   MCV 102.2 (H) 80.0 - 100.0 fL   MCH 34.9 (H) 26.0 - 34.0 pg   MCHC 34.2 30.0 - 36.0 g/dL   RDW 95.6 38.7 - 56.4 %   Platelets 65 (L) 150 - 400 K/uL    Comment: Immature Platelet Fraction may be clinically indicated, consider ordering this additional test [id number]    nRBC 0.0 0.0 - 0.2 %    Comment: Performed at [facility], [address] [place], [place] [address]   DG Chest Port 1 View  Result Date: 06/08/2022 CLINICAL DATA:  10026 Shortness of breath 10026 EXAM: PORTABLE CHEST 1 VIEW COMPARISON:  Chest x-ray 12/23/2021 FINDINGS: The heart and  mediastinal contours are unchanged. No focal consolidation. No pulmonary edema. No pleural effusion. No pneumothorax. No acute osseous abnormality. IMPRESSION: No active disease. Electronically Signed  By: Tish Frederickson M.D.   On: 06/08/2022 17:54    Pending Labs Unresulted Labs (From admission, onward)     Start     Ordered   06/09/22 0837  MRSA Next Gen by PCR, Nasal  Once,   R        06/09/22 0836            Vitals/Pain Today's Vitals   06/10/22 1300 06/10/22 1330 06/10/22 1400 06/10/22 1408  BP: (!) 161/99 (!) 164/101 (!) 155/91 (!) 155/91  Pulse: 71 82 72 74  Resp: 14 14 17    Temp:   98.6 F (37 C)   TempSrc:   Oral   SpO2: 100% 97% 95%   Weight:      Height:      PainSc:        Isolation Precautions No active isolations  Medications Medications  acetaminophen (TYLENOL) tablet 650 mg (650 mg Oral Given 06/08/22 1826)    Or  acetaminophen (TYLENOL) suppository 650 mg ( Rectal See Alternative 06/08/22 1826)  ondansetron (ZOFRAN) tablet 4 mg (has no administration in time range)    Or  ondansetron (ZOFRAN) injection 4 mg (has no administration in time range)  enoxaparin (LOVENOX) injection 40 mg (40 mg Subcutaneous Given 06/09/22 2205)  senna-docusate (Senokot-S) tablet 1 tablet (has no administration in time range)  cloNIDine (CATAPRES) tablet 0.2 mg (0.2 mg Oral Given 06/09/22 1527)  LORazepam (ATIVAN) tablet 1-4 mg ( Oral See Alternative 06/10/22 1410)    Or  LORazepam (ATIVAN) injection 1-4 mg (1 mg Intravenous Given 06/10/22 1410)  folic acid (FOLVITE) tablet 1 mg (1 mg Oral Given 06/10/22 1011)  multivitamin with minerals tablet 1 tablet (1 tablet Oral Given 06/10/22 1011)  melatonin tablet 5 mg (5 mg Oral Given 06/10/22 0007)  polyethylene glycol (MIRALAX / GLYCOLAX) packet 17 g (17 g Oral Not Given 06/10/22 1020)  metoprolol succinate (TOPROL-XL) 24 hr tablet 50 mg (50 mg Oral Given 06/10/22 1010)  PHENObarbital (LUMINAL) injection 97.5 mg (has no administration in time  range)    Followed by  PHENObarbital (LUMINAL) injection 65 mg (has no administration in time range)    Followed by  PHENObarbital (LUMINAL) injection 32.5 mg (has no administration in time range)  thiamine (VITAMIN B1) 500 mg in sodium chloride 0.9 % 50 mL IVPB (0 mg Intravenous Stopped 06/10/22 1058)    Followed by  thiamine (VITAMIN B1) 250 mg in sodium chloride 0.9 % 50 mL IVPB (has no administration in time range)    Followed by  thiamine (VITAMIN B1) injection 100 mg (has no administration in time range)  losartan (COZAAR) tablet 25 mg (25 mg Oral Given 06/10/22 1410)  diazepam (VALIUM) injection 10 mg (10 mg Intravenous Given 06/08/22 1545)  sodium chloride 0.9 % bolus 1,000 mL (0 mLs Intravenous Stopped 06/08/22 1630)  sodium chloride 0.9 % bolus 1,000 mL (0 mLs Intravenous Stopped 06/08/22 1753)  sodium chloride 0.9 % bolus 1,000 mL (0 mLs Intravenous Stopped 06/08/22 2033)  cloNIDine (CATAPRES) tablet 0.1 mg (0.1 mg Oral Given 06/08/22 1943)  potassium chloride SA (KLOR-CON M) CR tablet 40 mEq (40 mEq Oral Given 06/09/22 0947)  PHENObarbital (LUMINAL) 260 mg in sodium chloride 0.9 % 100 mL IVPB (0 mg Intravenous Stopped 06/10/22 1058)    Mobility walks with person assist     Focused Assessments Cardiac Assessment Handoff:  Cardiac Rhythm: Sinus tachycardia Lab Results  Component Value Date   CKTOTAL 184 01/11/2022   Lab Results  Component Value Date   DDIMER 0.39 01/12/2022   Does the Patient currently have chest pain? No    R Recommendations: See Admitting Provider Note  Report given to:   Additional Notes: Pt wants to leave, thinks he is incarcerated, circular arguments, belongings bagged and updated and oriented, easily redirectable, CIWA 10, will give ativan, sitter present, preparing for transport to floor/ unit. Plan explained. Pt not retaining new information. Remains calm, anxious, pleasantly agitated, confused and cooperative.

## 2022-06-10 NOTE — Progress Notes (Addendum)
       CROSS COVER NOTE  NAME: Lawrence Rowe MRN: [id number] DOB : 1970-10-14    Concern from nurse Eyvonne Mechanic   Per nurse Tiffany: "Patient refuse to allow me to administer sodium bicarb.Marland KitchenMarland KitchenHe said he dont want no more IV fluid"     Pertinent findings on chart review: -Patient admitted 5/30 with alcohol withdrawal and high anion gap metabolic acidosis -Per today's progress note by Dr Nelson Chimes: "High anion gap metabolic acidosis Resolved with bicarb infusion.  Likely secondary to alcohol abuse -Stop bicarb"     Latest Ref Rng & Units 06/09/2022    4:19 AM 06/08/2022    2:20 PM 01/13/2022    6:42 AM  BMP  Glucose 70 - 99 mg/dL 409  811  914   BUN 6 - 20 mg/dL 11  8  12    Creatinine 0.61 - 1.24 mg/dL 7.82  9.56  2.13   Sodium 135 - 145 mmol/L 139  138  138   Potassium 3.5 - 5.1 mmol/L 3.4  4.3  3.8   Chloride 98 - 111 mmol/L 106  104  108   CO2 22 - 32 mmol/L 22  13  20    Calcium 8.9 - 10.3 mg/dL 8.2  8.8  9.3    Anion gap 21>11   Assessment and  Interventions   Assessment: Resolved anion gap metabolic acidosis  Plan: Clinical decision to DC bicarb infusion based on improvement in anion gap X X

## 2022-06-10 NOTE — ED Notes (Addendum)
Admitting MD at [facility]. Pt alert, NAD, calm, interactive. Sitter present.

## 2022-06-10 NOTE — ED Notes (Signed)
Pt started to pull IV out, get out of bed, looking for his belongings. Recurrent behavior. Pt restless, anxious, confused, not retaining recent information given multiple times, continues to say it is May, when he has been told frequently multiple times that it is June, thinks he is in a clinic, and does not remember MD visit this am or hospital admission for ETOH withdrawal. Updated and oriented. Sitter remains at [facility].

## 2022-06-10 NOTE — Assessment & Plan Note (Signed)
Patient with overnight concern of worsening delirium and ataxia.  This morning he was quite appropriate but still tremulous.  Denies any hallucinations but unable to walk due to worsening ataxia. -Start him on high-dose thiamine for concern of developing Warnicke -Phenobarbital taper -Continue with CIWA protocol

## 2022-06-10 NOTE — Assessment & Plan Note (Signed)
Resolved with bicarb infusion.  Likely secondary to alcohol abuse Bicarb infusion was discontinued -Continue to monitor

## 2022-06-10 NOTE — Progress Notes (Signed)
Progress Note   Patient: Lawrence Rowe UJW:[id number] DOB: [birth year] DOA: 06/08/2022     2 DOS: the patient was seen and examined on 06/10/2022   Brief hospital course: [name] is a 52 year old male with history of alcohol abuse, hypertension, who presents emergency department for chief concerns of tremors and elevated heart rate.  He normally drinks on a daily basis, last drink was 06/07/2022.  Drinks about 12 beers per day.  Vitals in the ED showed temperature of 98.1, respiration of 18, heart rate of 121, blood pressure 151/95, SpO2 100% on room air.  Serum sodium is 138, potassium 4.8, chloride 104, bicarb 13, BUN of 8, serum creatinine of 0.78, EGFR greater than 60, nonfasting blood glucose 101, WBC 9.2, hemoglobin 15.1, platelets of 88.  Lactic acid is 5.7.  ED treatment: Valium 10 mg IV one-time dose, CIWA precaution initiated, sodium chloride 1 L bolus x 2 were given.  5/31: Blood pressure mildly elevated at 169/93, persistent mild tachycardia.  Labs with platelets of 60 but all cell lines decreased so most likely some dilutional effect.  Macrocytosis.  Mild hypokalemia which is being repleted.  CIWA score of 10.  6/1: Vital stable, overnight becoming more confused and agitated.  Worsening ataxia.  Concern of alcohol related delirium and/or Warnicke.  Starting him on phenobarbital taper and high-dose thiamine.  Sitter at bedside.  PT ordered  Assessment and Plan: * Alcohol withdrawal with inpatient treatment [facility] Patient with overnight concern of worsening delirium and ataxia.  This morning he was quite appropriate but still tremulous.  Denies any hallucinations but unable to walk due to worsening ataxia. -Start him on high-dose thiamine for concern of developing Warnicke -Phenobarbital taper -Continue with CIWA protocol  Alcohol abuse Phosphorus was normal.  Counseling was provided.  Primary hypertension Patient was on metoprolol at home which he was not  taking. Blood pressure remained elevated -Restart home metoprolol -Start him on low-dose losartan   High anion gap metabolic acidosis Resolved with bicarb infusion.  Likely secondary to alcohol abuse Bicarb infusion was discontinued -Continue to monitor  Transaminitis AST/ALT elevated by 2-1, consistent with history of alcohol use   Subjective: Patient is still tremulous when seen today.  Denies any hallucinations.  Overnight worsening agitation and ataxia.  Sitter at bedside  Physical Exam: Vitals:   06/10/22 1200 06/10/22 1230 06/10/22 1256 06/10/22 1300  BP: (!) 162/97 (!) 169/103 (!) 169/103 (!) 161/99  Pulse: 66 78 78 71  Resp: (!) 22 18  14   Temp:      TempSrc:      SpO2: 93% 98%  100%  Weight:      Height:       General.  Well-developed gentleman, in no acute distress. Pulmonary.  Lungs clear bilaterally, normal respiratory effort. CV.  Regular rate and rhythm, no JVD, rub or murmur. Abdomen.  Soft, nontender, nondistended, BS positive. CNS.  Alert and oriented .  No focal neurologic deficit.  Ataxia Extremities.  No edema, no cyanosis, pulses intact and symmetrical. Psychiatry.  Judgment and insight appears normal. .   Data Reviewed: Prior data reviewed  Family Communication: Discussed with patient  Disposition: Status is: Inpatient Remains inpatient appropriate because: Severity of illness  Planned Discharge Destination: Home  DVT prophylaxis.  Lovenox Time spent: 50 minutes  This record has been created using Conservation officer, historic buildings. Errors have been sought and corrected,but may not always be located. Such creation errors do not reflect on the standard of care.  Author: Arnetha Courser, MD 06/10/2022 1:31 PM  For on call review [email or web address].

## 2022-06-10 NOTE — Assessment & Plan Note (Signed)
Patient was on metoprolol at home which he was not taking. Blood pressure remained elevated -Restart home metoprolol -Start him on low-dose losartan

## 2022-06-10 NOTE — ED Notes (Signed)
Pt wants to leave, thinks he is incarcerated, circular arguments, belongings bagged and updated and oriented, easily redirectable, CIWA 10, will give ativan, sitter present, preparing for transport to floor/ unit. Plan explained. Pt not retaining new information. Remains calm, anxious, pleasantly agitated, confused and cooperative.

## 2022-06-11 MED ORDER — LORAZEPAM 2 MG/ML IJ SOLN: 1.0000 mg | INTRAMUSCULAR | Status: AC | PRN

## 2022-06-11 MED ORDER — LORAZEPAM 1 MG PO TABS: 1.0000 mg | ORAL_TABLET | ORAL | Status: AC | PRN

## 2022-06-11 NOTE — Progress Notes (Signed)
Mobility Specialist - Progress Note    06/11/22 1400  Mobility  Activity Ambulated with assistance in hallway  Level of Assistance Contact guard assist, steadying assist  Assistive Device Front wheel walker  Distance Ambulated (ft) 60 ft  Range of Motion/Exercises Active  Activity Response Tolerated well  Mobility Referral Yes  $Mobility charge 1 Mobility  Mobility Specialist Start Time (ACUTE ONLY) 1338  Mobility Specialist Stop Time (ACUTE ONLY) 1401  Mobility Specialist Time Calculation (min) (ACUTE ONLY) 23 min   Pt resting in bed on RA upon entry. Pt STS x2 MinA (pt was a little excited/ impulsive when stand and almost fell back) and ambulates to hallway with RW CGA. Pt gait is short and mildly stable. Pt had trouble turning with walker due to gait. Pt returned to recliner and left with needs in reach; chair alarm activated.   Johnathan Hausen Mobility Specialist 06/11/22, 2:09 PM

## 2022-06-11 NOTE — TOC Progression Note (Signed)
Transition of Care [facility]) - Progression Note    Patient Details  Name: Lawrence Rowe MRN: [id number] Date of Birth: 07-08-1970  Transition of Care [facility]) CM/SW Contact  Kemper Durie, RN Phone Number: 06/11/2022, 3:08 PM  Clinical Narrative:     Recommendations for SNF for short term rehab.  Attempted to discuss discharge planning with mother, no answer.  TOC will continue to follow for needs.        Expected Discharge Plan and Services                                               Social Determinants of Health (SDOH) Interventions SDOH Screenings   Food Insecurity: No Food Insecurity (06/09/2022)  Housing: Low Risk  (06/09/2022)  Transportation Needs: No Transportation Needs (06/09/2022)  Utilities: Not At Risk (06/09/2022)  Depression (PHQ2-9): Low Risk  (04/19/2021)  Tobacco Use: Medium Risk (06/09/2022)    Readmission Risk Interventions     No data to display

## 2022-06-11 NOTE — Evaluation (Signed)
Physical Therapy Evaluation Patient Details Name: Lawrence Rowe MRN: [id number] DOB: 02-29-1970 Today's Date: 06/11/2022  History of Present Illness  presented to ER secondary to worsening tremors, elevated HR and difficulty walking; admitted for management of ETOH withdrawal requiring inpatient treatment  Clinical Impression  Patient resting in bed upon arrival to room; MHT present at bedside as sitter for safety.  Patient alert and oriented to self, location; follows simple commands. However, very distractible by both internal/external environment; very limited ability to effectively problem-solve and sequence functional tasks during session.  Denies pain; does voice need to empty bladder during session (assisted as appropriate).  Bilat UE/LE strength and ROM grossly symmetrical and WFL; no focal weakness appreciated.  Mild tremors to bilat UEs > LEs, with exertion > resting.  Currently requiring close sup for bed mobility; min/mod assist for sit/stand, standing balance, basic transfers.  Min/mod assist for initial stand due to heavy R lateral lean; corrected/improved to min assist with subsequent repetition. does require UE support, +1 and RW for optimal safety. Step by step cuing and manual faciltiation from therapist for walker placement and transfer sequencing; poor ability to problem-solve and progress mid-task.  Additional gait training deferred this date due to bladder/hygiene needs; will continue to assess/progress in subsequent sessions as appropriate. Would benefit from skilled PT to address above deficits and promote optimal return to PLOF.; recommend post-acute PT follow up as indicated by interdisciplinary care team.         Recommendations for follow up therapy are one component of a multi-disciplinary discharge planning process, led by the attending physician.  Recommendations may be updated based on patient status, additional functional criteria and insurance  authorization.  Follow Up Recommendations Can patient physically be transported by private vehicle: Yes     Assistance Recommended at Discharge Frequent or constant Supervision/Assistance  Patient can return home with the following  A lot of help with walking and/or transfers;A lot of help with bathing/dressing/bathroom    Equipment Recommendations    Recommendations for Other Services       Functional Status Assessment Patient has had a recent decline in their functional status and demonstrates the ability to make significant improvements in function in a reasonable and predictable amount of time.     Precautions / Restrictions Precautions Precautions: Fall Restrictions Weight Bearing Restrictions: No      Mobility  Bed Mobility Overal bed mobility: Needs Assistance Bed Mobility: Supine to Sit     Supine to sit: Supervision     General bed mobility comments: slightly impulsive    Transfers Overall transfer level: Needs assistance Equipment used: Rolling walker (2 wheels) Transfers: Sit to/from Stand, Bed to chair/wheelchair/BSC Sit to Stand: Min assist, Mod assist Stand pivot transfers: Min assist         General transfer comment: min/mod assist for initial stand due to heavy R lateral lean; corrected/improved to min assist with subsequent repetition.  does require UE support, +1 and RW for optimal safety.  Step by step cuing and manual faciltiation from therapist for walker placement and transfer sequencing; poor ability to problem-solve and progress mid-task    Ambulation/Gait               General Gait Details: deferred this date due to toileting needs; will assess/progress next session as appropriate  Stairs            Wheelchair Mobility    Modified Rankin (Stroke Patients Only)       Balance Overall balance  assessment: Needs assistance Sitting-balance support: No upper extremity supported, Feet supported Sitting balance-Leahy Scale:  Good     Standing balance support: Bilateral upper extremity supported Standing balance-Leahy Scale: Fair                               Pertinent Vitals/Pain Pain Assessment Pain Assessment: No/denies pain    Home Living Family/patient expects to be discharged to:: Private residence Living Arrangements: Alone Available Help at Discharge: Available PRN/intermittently Type of Home: House Home Access: Stairs to enter Entrance Stairs-Rails: None Entrance Stairs-Number of Steps: 3-4 Alternate Level Stairs-Number of Steps: 14 Home Layout: Two level   Additional Comments: Patient questionable historian; information obtained from previously documented evaluation.  Will verify with family as available.    Prior Function Prior Level of Function : Independent/Modified Independent;Driving             Mobility Comments: Indep with ADLs, household and community mobilization       Hand Dominance        Extremity/Trunk Assessment   Upper Extremity Assessment Upper Extremity Assessment: Overall WFL for tasks assessed (grossly at least 4-/5 throughout; no focal weakness; mild tremor, exertion > rest)    Lower Extremity Assessment Lower Extremity Assessment: Overall WFL for tasks assessed (grossly at least 4-/5 throughout; no focal weakness; mild tremor, exertion > rest)       Communication   Communication: No difficulties  Cognition Arousal/Alertness: Awake/alert Behavior During Therapy: WFL for tasks assessed/performed Overall Cognitive Status: No family/caregiver present to determine baseline cognitive functioning                                 General Comments: Alert and oriented to self, location; follows simple commands; very distractible by internal/external environment, poor ability to problem-solve and sequence functional activities        General Comments      Exercises Other Exercises Other Exercises: Sit/stand x3 from edge of bed,  min/mod progressing to min assist with RW.  Maintains broad BOS, limited balance reactions evident. Other Exercises: Voiced need to empty bladder, min/mod assist to maintain balance in standing to use urinal; does require unilateral UE support on RW at all times to maintain balance/safety.  Urine spilled from urinal during task; dep assisist for gown change, hygiene and clean up.  Able to don/doff socks (using figure 4-position and reaching to feet on floor) with set up/sup.  Mild UE tremors with purposeful task   Assessment/Plan    PT Assessment Patient needs continued PT services  PT Problem List Decreased activity tolerance;Decreased balance;Decreased mobility;Decreased coordination;Decreased cognition;Decreased knowledge of use of DME;Decreased safety awareness;Decreased knowledge of precautions       PT Treatment Interventions DME instruction;Gait training;Stair training;Functional mobility training;Therapeutic activities;Therapeutic exercise;Balance training;Patient/family education    PT Goals (Current goals can be found in the Care Plan section)  Acute Rehab PT Goals Patient Stated Goal: to get going and get back home PT Goal Formulation: With patient Time For Goal Achievement: 06/25/22 Potential to Achieve Goals: Good    Frequency Min 3X/week     Co-evaluation               AM-PAC PT "6 Clicks" Mobility  Outcome Measure Help needed turning from your back to your side while in a flat bed without using bedrails?: None Help needed moving from lying on your back to  sitting on the side of a flat bed without using bedrails?: None Help needed moving to and from a bed to a chair (including a wheelchair)?: A Little Help needed standing up from a chair using your arms (e.g., wheelchair or bedside chair)?: A Little Help needed to walk in hospital room?: A Lot Help needed climbing 3-5 steps with a railing? : A Lot 6 Click Score: 18    End of Session Equipment Utilized During  Treatment: Gait belt Activity Tolerance: Patient tolerated treatment well Patient left: in chair;with call bell/phone within reach (MHT present as sitter in room) Nurse Communication: Mobility status PT Visit Diagnosis: Muscle weakness (generalized) (M62.81);Difficulty in walking, not elsewhere classified (R26.2)    Time: 1610-9604 PT Time Calculation (min) (ACUTE ONLY): 20 min   Charges:   PT Evaluation $PT Eval Moderate Complexity: 1 Mod PT Treatments $Therapeutic Activity: 8-22 mins       Jazzlyn Huizenga H. Manson Passey, PT, DPT, NCS 06/11/22, 11:11 AM [phone]

## 2022-06-11 NOTE — Progress Notes (Signed)
Progress Note   Patient: Lawrence Rowe:[id number] DOB: May 05, 1970 DOA: 06/08/2022     3 DOS: the patient was seen and examined on 06/11/2022   Brief hospital course: [name] is a 52 year old male with history of alcohol abuse, hypertension, who presents emergency department for chief concerns of tremors and elevated heart rate.  He normally drinks on a daily basis, last drink was 06/07/2022.  Drinks about 12 beers per day.  Vitals in the ED showed temperature of 98.1, respiration of 18, heart rate of 121, blood pressure 151/95, SpO2 100% on room air.  Serum sodium is 138, potassium 4.8, chloride 104, bicarb 13, BUN of 8, serum creatinine of 0.78, EGFR greater than 60, nonfasting blood glucose 101, WBC 9.2, hemoglobin 15.1, platelets of 88.  Lactic acid is 5.7.  ED treatment: Valium 10 mg IV one-time dose, CIWA precaution initiated, sodium chloride 1 L bolus x 2 were given.  5/31: Blood pressure mildly elevated at 169/93, persistent mild tachycardia.  Labs with platelets of 60 but all cell lines decreased so most likely some dilutional effect.  Macrocytosis.  Mild hypokalemia which is being repleted.  CIWA score of 10.  6/1: Vital stable, overnight becoming more confused and agitated.  Worsening ataxia.  Concern of alcohol related delirium and/or Warnicke.  Starting him on phenobarbital taper and high-dose thiamine.  Sitter at bedside.  PT ordered.  6/2: Patient remained ataxic and little shaky.  No hallucinations or confabulation.  CIWA of 17  Assessment and Plan: * Alcohol withdrawal with inpatient treatment [facility]) Patient with overnight concern of worsening delirium and ataxia.  This morning he was quite appropriate but still tremulous.  Denies any hallucinations but unable to walk due to worsening ataxia. -Continue with high-dose thiamine for concern of developing Warnicke -Phenobarbital taper -Continue with CIWA protocol  Alcohol abuse Phosphorus was normal.   Counseling was provided.  Primary hypertension Patient was on metoprolol at home which he was not taking. Blood pressure remained elevated -Restart home metoprolol -Start him on low-dose losartan   High anion gap metabolic acidosis Resolved with bicarb infusion.  Likely secondary to alcohol abuse Bicarb infusion was discontinued -Continue to monitor  Transaminitis AST/ALT elevated by 2-1, consistent with history of alcohol use   Subjective: Patient was sitting comfortably when seen today.  Alert and oriented.  Still little shaky and wobbly.  Physical Exam: Vitals:   06/11/22 0007 06/11/22 0442 06/11/22 0830 06/11/22 1132  BP: (!) 157/96 (!) 160/99 (!) 144/92 (!) 149/93  Pulse: 79 77 65 68  Resp: 16 18 16 16   Temp: 98 F (36.7 C) 98.1 F (36.7 C) 97.8 F (36.6 C) 98 F (36.7 C)  TempSrc: Oral Oral    SpO2: 99% 98% 100% 98%  Weight:      Height:       General.  Well-developed gentleman, in no acute distress. Pulmonary.  Lungs clear bilaterally, normal respiratory effort. CV.  Regular rate and rhythm, no JVD, rub or murmur. Abdomen.  Soft, nontender, nondistended, BS positive. CNS.  Alert and oriented .  No focal neurologic deficit. Extremities.  No edema, no cyanosis, pulses intact and symmetrical. Psychiatry.  Judgment and insight appears normal.   Data Reviewed: Prior data reviewed  Family Communication: Discussed with patient, tried calling mother with no response  Disposition: Status is: Inpatient Remains inpatient appropriate because: Severity of illness  Planned Discharge Destination: Home  DVT prophylaxis.  Lovenox Time spent: 50 minutes  This record has been created using  Dragon Chemical engineer. Errors have been sought and corrected,but may not always be located. Such creation errors do not reflect on the standard of care.   Author: Arnetha Courser, MD 06/11/2022 2:14 PM  For on call review [email or web address].

## 2022-06-12 MED ORDER — LOSARTAN POTASSIUM 50 MG PO TABS
50.0000 mg | ORAL_TABLET | Freq: Every day | ORAL | Status: DC
  Administered 2022-06-13 – 2022-06-16 (×4): 50 mg via ORAL
  Filled 2022-06-12 (×4): qty 1

## 2022-06-12 MED ORDER — HYDRALAZINE HCL 25 MG PO TABS
25.0000 mg | ORAL_TABLET | Freq: Three times a day (TID) | ORAL | Status: DC | PRN
  Filled 2022-06-12: qty 1

## 2022-06-12 MED ORDER — ORAL CARE MOUTH RINSE: 15.0000 mL | OROMUCOSAL | Status: DC | PRN

## 2022-06-12 NOTE — Progress Notes (Signed)
Physical Therapy Treatment Patient Details Name: Lawrence Rowe MRN: [id number] DOB: 05/25/1970 Today's Date: 06/12/2022   History of Present Illness presented to ER secondary to worsening tremors, elevated HR and difficulty walking; admitted for management of ETOH withdrawal requiring inpatient treatment    PT Comments    Patient was agreeable to PT session. He required minimal assistance for standing with improved standing balance today. He also had increased activity tolerance and was able to ambulate a lap in the hallway with Min guard assistance using rolling walker with occasional safety cues required with mobility. PT will continue to follow to maximize independence and facilitate return to prior level of function. Anticipate the need for frequent assistance required at discharge with ongoing PT recommended after this hospital stay.    Recommendations for follow up therapy are one component of a multi-disciplinary discharge planning process, led by the attending physician.  Recommendations may be updated based on patient status, additional functional criteria and insurance authorization.  Follow Up Recommendations  Can patient physically be transported by private vehicle: Yes    Assistance Recommended at Discharge Frequent or constant Supervision/Assistance  Patient can return home with the following A lot of help with walking and/or transfers;A lot of help with bathing/dressing/bathroom   Equipment Recommendations  Rolling walker (2 wheels)    Recommendations for Other Services       Precautions / Restrictions Precautions Precautions: Fall Restrictions Weight Bearing Restrictions: No     Mobility  Bed Mobility Overal bed mobility: Needs Assistance Bed Mobility: Supine to Sit, Sit to Supine     Supine to sit: Min assist, HOB elevated Sit to supine: Supervision, HOB elevated   General bed mobility comments: trunk support provided to sit upright     Transfers Overall transfer level: Needs assistance Equipment used: Rolling walker (2 wheels) Transfers: Sit to/from Stand Sit to Stand: Min assist           General transfer comment: steadying assistance provided with standing as patient is miildly impulsive with activity.    Ambulation/Gait Ambulation/Gait assistance: Min guard Gait Distance (Feet): 175 Feet Assistive device: Rolling walker (2 wheels) Gait Pattern/deviations: Step-through pattern Gait velocity: decreased     General Gait Details: reinforcement to stand close to rolling walker for support with ambulation. mild unsteadiness but much improved standing balance today compared to previous session. heart rate in the 80's and Sp02 97% on room air after walking   Stairs             Wheelchair Mobility    Modified Rankin (Stroke Patients Only)       Balance Overall balance assessment: Needs assistance Sitting-balance support: No upper extremity supported, Feet supported Sitting balance-Leahy Scale: Good     Standing balance support: Bilateral upper extremity supported, Reliant on assistive device for balance Standing balance-Leahy Scale: Fair                              Cognition Arousal/Alertness: Awake/alert Behavior During Therapy: WFL for tasks assessed/performed Overall Cognitive Status: No family/caregiver present to determine baseline cognitive functioning                                 General Comments: patient is able to follow single step commands consistently with increased time. cues for safety with mobility intermittently        Exercises  General Comments        Pertinent Vitals/Pain Pain Assessment Pain Assessment: No/denies pain    Home Living                          Prior Function            PT Goals (current goals can now be found in the care plan section) Acute Rehab PT Goals Patient Stated Goal: to go home PT Goal  Formulation: With patient Time For Goal Achievement: 06/25/22 Potential to Achieve Goals: Good Progress towards PT goals: Progressing toward goals    Frequency    Min 3X/week      PT Plan Current plan remains appropriate    Co-evaluation              AM-PAC PT "6 Clicks" Mobility   Outcome Measure  Help needed turning from your back to your side while in a flat bed without using bedrails?: None Help needed moving from lying on your back to sitting on the side of a flat bed without using bedrails?: None Help needed moving to and from a bed to a chair (including a wheelchair)?: A Little Help needed standing up from a chair using your arms (e.g., wheelchair or bedside chair)?: A Little Help needed to walk in hospital room?: A Lot Help needed climbing 3-5 steps with a railing? : A Lot 6 Click Score: 18    End of Session Equipment Utilized During Treatment: Gait belt Activity Tolerance: Patient tolerated treatment well Patient left: in bed;with call bell/phone within reach;with nursing/sitter in room (1:1 sitter in the room) Nurse Communication: Mobility status PT Visit Diagnosis: Muscle weakness (generalized) (M62.81);Difficulty in walking, not elsewhere classified (R26.2)     Time: 1003-1016 PT Time Calculation (min) (ACUTE ONLY): 13 min  Charges:  $Therapeutic Activity: 8-22 mins                    Donna Bernard, PT, MPT    Ina Homes 06/12/2022, 11:42 AM

## 2022-06-12 NOTE — Progress Notes (Signed)
Progress Note   Patient: Lawrence Rowe ZOX:[id number] DOB: 11-24-1970 DOA: 06/08/2022     4 DOS: the patient was seen and examined on 06/12/2022   Brief hospital course: [name] is a 52 year old male with history of alcohol abuse, hypertension, who presents emergency department for chief concerns of tremors and elevated heart rate.  He normally drinks on a daily basis, last drink was 06/07/2022.  Drinks about 12 beers per day.  Vitals in the ED showed temperature of 98.1, respiration of 18, heart rate of 121, blood pressure 151/95, SpO2 100% on room air.  Serum sodium is 138, potassium 4.8, chloride 104, bicarb 13, BUN of 8, serum creatinine of 0.78, EGFR greater than 60, nonfasting blood glucose 101, WBC 9.2, hemoglobin 15.1, platelets of 88.  Lactic acid is 5.7.  ED treatment: Valium 10 mg IV one-time dose, CIWA precaution initiated, sodium chloride 1 L bolus x 2 were given.  5/31: Blood pressure mildly elevated at 169/93, persistent mild tachycardia.  Labs with platelets of 60 but all cell lines decreased so most likely some dilutional effect.  Macrocytosis.  Mild hypokalemia which is being repleted.  CIWA score of 10.  6/1: Vital stable, overnight becoming more confused and agitated.  Worsening ataxia.  Concern of alcohol related delirium and/or Warnicke.  Starting him on phenobarbital taper and high-dose thiamine.  Sitter at bedside.  PT ordered.  6/2: Patient remained ataxic and little shaky.  No hallucinations or confabulation.  CIWA of 17  6/3: Hemodynamically stable and overall improving, CIWA score of 2.  Original PT recommendations were to go to SNF but patient wants to go home stating that his ability to walk is improving.  Message sent to PT to continue working with him.  Patient need to stay in hospital until Friday to complete high-dose thiamine. Patient does not want to involve any family member at this time.  Assessment and Plan: * Alcohol withdrawal  with inpatient treatment (HCC) Overall improving, CIWA score of 2.  Tremors and ataxia both started improving -Continue with high-dose thiamine for concern of developing Warnicke, will complete the course on Friday -Phenobarbital taper -Continue with CIWA protocol  Alcohol abuse Phosphorus was normal.  Counseling was provided.  Primary hypertension Patient was on metoprolol at home which he was not taking. Blood pressure remained elevated -Continue home metoprolol -Increasing the dose of losartan-it was started during current hospitalization   High anion gap metabolic acidosis Resolved with bicarb infusion.  Likely secondary to alcohol abuse Bicarb infusion was discontinued -Continue to monitor  Transaminitis AST/ALT elevated by 2-1, consistent with history of alcohol use   Subjective: Patient with no new complaints.  Stating that his walking ability seems improving and he is much less shaky.  He does not want to go to rehab.  He also does not want to involve his family at this time.  Physical Exam: Vitals:   06/11/22 1912 06/11/22 2312 06/12/22 0328 06/12/22 1249  BP: (!) 145/91 (!) 140/88 (!) 138/90 (!) 151/96  Pulse: 79 80 76 73  Resp: 18 16 16 20   Temp: 98.1 F (36.7 C) 98.1 F (36.7 C) 98 F (36.7 C) 97.7 F (36.5 C)  TempSrc: Oral Oral Oral Oral  SpO2: 98% 99% 99% 96%  Weight:      Height:       General.  Well-developed gentleman, in no acute distress. Pulmonary.  Lungs clear bilaterally, normal respiratory effort. CV.  Regular rate and rhythm, no JVD, rub or murmur. Abdomen.  Soft, nontender, nondistended, BS positive. CNS.  Alert and oriented .  No focal neurologic deficit. Extremities.  No edema, no cyanosis, pulses intact and symmetrical. Psychiatry.  Judgment and insight appears normal.   Data Reviewed: Prior data reviewed  Family Communication: Discussed with patient, patient does not want me to call any family member.  Disposition: Status is:  Inpatient Remains inpatient appropriate because: Severity of illness  Planned Discharge Destination: Home  DVT prophylaxis.  Lovenox Time spent: 45 minutes  This record has been created using Conservation officer, historic buildings. Errors have been sought and corrected,but may not always be located. Such creation errors do not reflect on the standard of care.   Author: Arnetha Courser, MD 06/12/2022 2:48 PM  For on call review [email or web address].

## 2022-06-13 LAB — CBC
HCT: 39.7 % (ref 39.0–52.0)
Hemoglobin: 13.6 g/dL (ref 13.0–17.0)
MCH: 35.7 pg — ABNORMAL HIGH (ref 26.0–34.0)
MCHC: 34.3 g/dL (ref 30.0–36.0)
MCV: 104.2 fL — ABNORMAL HIGH (ref 80.0–100.0)
Platelets: 93 10*3/uL — ABNORMAL LOW (ref 150–400)
RBC: 3.81 MIL/uL — ABNORMAL LOW (ref 4.22–5.81)
RDW: 14.2 % (ref 11.5–15.5)
WBC: 5.8 10*3/uL (ref 4.0–10.5)
nRBC: 0 % (ref 0.0–0.2)

## 2022-06-13 NOTE — Progress Notes (Signed)
Physical Therapy Treatment Patient Details Name: Lawrence Rowe MRN: [id number] DOB: Jan 22, 1970 Today's Date: 06/13/2022   History of Present Illness presented to ER secondary to worsening tremors, elevated HR and difficulty walking; admitted for management of ETOH withdrawal requiring inpatient treatment    PT Comments    Patient is agreeable to PT session. He needs intermittent safety cues with mobility. Patient ambulated in hallway with occasional unsteadiness that is self corrected. Recommend to continue using rolling walker at the this time but patient has a goal of progressing ambulation without device. PT will continue to follow to maximize independence and decrease caregiver burden.    Recommendations for follow up therapy are one component of a multi-disciplinary discharge planning process, led by the attending physician.  Recommendations may be updated based on patient status, additional functional criteria and insurance authorization.  Follow Up Recommendations  Can patient physically be transported by private vehicle: Yes    Assistance Recommended at Discharge Frequent or constant Supervision/Assistance  Patient can return home with the following A lot of help with walking and/or transfers;A lot of help with bathing/dressing/bathroom   Equipment Recommendations  Rolling walker (2 wheels)    Recommendations for Other Services       Precautions / Restrictions Precautions Precautions: Fall Restrictions Weight Bearing Restrictions: No     Mobility  Bed Mobility Overal bed mobility: Needs Assistance Bed Mobility: Supine to Sit, Sit to Supine     Supine to sit: Min guard, HOB elevated Sit to supine: Supervision, HOB elevated   General bed mobility comments: verbal cues for technique.    Transfers Overall transfer level: Needs assistance Equipment used: Rolling walker (2 wheels) Transfers: Sit to/from Stand Sit to Stand: Min assist           General  transfer comment: steadying assistance provided for stand. patient is impulsive initially with sitting up and wanted to stand immediately. encouraged patient to sit for a few minutes before standing to monitor for any signs of dizziness    Ambulation/Gait Ambulation/Gait assistance: Min guard Gait Distance (Feet): 175 Feet Assistive device: Rolling walker (2 wheels) Gait Pattern/deviations: Step-through pattern Gait velocity: decreased     General Gait Details: occasional unsteadiness that is self corrected. rolling walker used for safety and encouraged as long a balance is impaired. no shortness of breath is noted with activity. Sp02 98% on room air and heart rate in the 80's after activity.   Stairs             Wheelchair Mobility    Modified Rankin (Stroke Patients Only)       Balance Overall balance assessment: Needs assistance Sitting-balance support: No upper extremity supported, Feet supported Sitting balance-Leahy Scale: Good     Standing balance support: Bilateral upper extremity supported, Reliant on assistive device for balance Standing balance-Leahy Scale: Fair                              Cognition Arousal/Alertness: Awake/alert Behavior During Therapy: WFL for tasks assessed/performed Overall Cognitive Status: No family/caregiver present to determine baseline cognitive functioning                                 General Comments: patient is able to follow single step commands consistently. he was able to correctly tell me about this morning's events (walking in the hallway 2 laps with assistance)  Exercises      General Comments        Pertinent Vitals/Pain Pain Assessment Pain Assessment: No/denies pain    Home Living                          Prior Function            PT Goals (current goals can now be found in the care plan section) Acute Rehab PT Goals Patient Stated Goal: to go home PT  Goal Formulation: With patient Time For Goal Achievement: 06/25/22 Potential to Achieve Goals: Good Progress towards PT goals: Progressing toward goals    Frequency    Min 3X/week      PT Plan Current plan remains appropriate    Co-evaluation              AM-PAC PT "6 Clicks" Mobility   Outcome Measure  Help needed turning from your back to your side while in a flat bed without using bedrails?: None Help needed moving from lying on your back to sitting on the side of a flat bed without using bedrails?: None Help needed moving to and from a bed to a chair (including a wheelchair)?: A Little Help needed standing up from a chair using your arms (e.g., wheelchair or bedside chair)?: A Little Help needed to walk in hospital room?: A Lot Help needed climbing 3-5 steps with a railing? : A Lot 6 Click Score: 18    End of Session Equipment Utilized During Treatment: Gait belt Activity Tolerance: Patient tolerated treatment well Patient left: in bed;with call bell/phone within reach;with nursing/sitter in room Nurse Communication: Mobility status PT Visit Diagnosis: Muscle weakness (generalized) (M62.81);Difficulty in walking, not elsewhere classified (R26.2)     Time: 1610-9604 PT Time Calculation (min) (ACUTE ONLY): 12 min  Charges:  $Therapeutic Activity: 8-22 mins                     Donna Bernard, PT, MPT    Ina Homes 06/13/2022, 1:09 PM

## 2022-06-13 NOTE — Progress Notes (Signed)
Progress Note   Patient: Lawrence Rowe ZOX:[id number] DOB: [birth year] DOA: 06/08/2022     5 DOS: the patient was seen and examined on 06/13/2022   Brief hospital course: [name] is a 52 year old male with history of alcohol abuse, hypertension, who presents emergency department for chief concerns of tremors and elevated heart rate.  He normally drinks on a daily basis, last drink was 06/07/2022.  Drinks about 12 beers per day.  Vitals in the ED showed temperature of 98.1, respiration of 18, heart rate of 121, blood pressure 151/95, SpO2 100% on room air.  Serum sodium is 138, potassium 4.8, chloride 104, bicarb 13, BUN of 8, serum creatinine of 0.78, EGFR greater than 60, nonfasting blood glucose 101, WBC 9.2, hemoglobin 15.1, platelets of 88.  Lactic acid is 5.7.  ED treatment: Valium 10 mg IV one-time dose, CIWA precaution initiated, sodium chloride 1 L bolus x 2 were given.  5/31: Blood pressure mildly elevated at 169/93, persistent mild tachycardia.  Labs with platelets of 60 but all cell lines decreased so most likely some dilutional effect.  Macrocytosis.  Mild hypokalemia which is being repleted.  CIWA score of 10.  6/1: Vital stable, overnight becoming more confused and agitated.  Worsening ataxia.  Concern of alcohol related delirium and/or Warnicke.  Starting him on phenobarbital taper and high-dose thiamine.  Sitter at bedside.  PT ordered.  6/2: Patient remained ataxic and little shaky.  No hallucinations or confabulation.  CIWA of 17  6/3: Hemodynamically stable and overall improving, CIWA score of 2.  Original PT recommendations were to go to SNF but patient wants to go home stating that his ability to walk is improving.  Message sent to PT to continue working with him.  Patient need to stay in hospital until Friday to complete high-dose thiamine. Patient does not want to involve any family member at this time.  6/4: Continue to improve, planning to walk  without walker tomorrow.  Would like to go home instead of rehab.  Will finish high-dose thiamine on Friday.  Assessment and Plan: * Alcohol withdrawal with inpatient treatment (HCC) Overall improving, CIWA score of 1.  Tremors and ataxia both started improving -Continue with high-dose thiamine for concern of developing Warnicke, will complete the course on Friday -Phenobarbital taper -Continue with CIWA protocol  Alcohol abuse Phosphorus was normal.  Counseling was provided.  Primary hypertension Patient was on metoprolol at home which he was not taking. Blood pressure remained elevated -Continue home metoprolol -Increasing the dose of losartan-it was started during current hospitalization   High anion gap metabolic acidosis Resolved with bicarb infusion.  Likely secondary to alcohol abuse Bicarb infusion was discontinued -Continue to monitor  Transaminitis AST/ALT elevated by 2-1, consistent with history of alcohol use   Subjective: Patient with no new concerns today.  Stating that he has a goal to walk without walker tomorrow.  Physical Exam: Vitals:   06/13/22 0531 06/13/22 0804 06/13/22 1226 06/13/22 1254  BP: (!) 148/92 (!) 140/92 (!) 175/94 (!) 154/94  Pulse: 78 70 70 68  Resp: 20 20    Temp: 98 F (36.7 C) 98.2 F (36.8 C)  97.9 F (36.6 C)  TempSrc:  Oral    SpO2: 100% 98% 98%   Weight:      Height:       General.  Well-developed gentleman, in no acute distress. Pulmonary.  Lungs clear bilaterally, normal respiratory effort. CV.  Regular rate and rhythm, no JVD, rub or murmur.  Abdomen.  Soft, nontender, nondistended, BS positive. CNS.  Alert and oriented .  No focal neurologic deficit. Extremities.  No edema, no cyanosis, pulses intact and symmetrical. Psychiatry.  Judgment and insight appears normal.    Data Reviewed: Prior data reviewed  Family Communication: Discussed with patient, patient does not want me to call any family  member.  Disposition: Status is: Inpatient Remains inpatient appropriate because: Severity of illness  Planned Discharge Destination: Home  DVT prophylaxis.  Lovenox Time spent: 40 minutes  This record has been created using Conservation officer, historic buildings. Errors have been sought and corrected,but may not always be located. Such creation errors do not reflect on the standard of care.   Author: Arnetha Courser, MD 06/13/2022 2:52 PM  For on call review [email or web address].

## 2022-06-13 NOTE — Progress Notes (Signed)
Mobility Specialist - Progress Note   06/13/22 1100  Mobility  Activity Ambulated with assistance in hallway;Ambulated with assistance to bathroom  Level of Assistance Standby assist, set-up cues, supervision of patient - no hands on  Assistive Device Front wheel walker  Distance Ambulated (ft) 360 ft  Activity Response Tolerated well  Mobility Referral Yes  $Mobility charge 1 Mobility     Pt lying in bed upon arrival, utilizing RA. Pt agreeable to activity. Pt completed bed mobility modI. STS and ambulation in hallway with minG. No LOB. Mildly unsteady with gait. No complaints. Pt returned to room, ambulating to restroom with B HHA - no AD prior to return to bed. Increased unsteadiness without RW. Pt left in bed with needs in reach, sitter at bedside.    Filiberto Pinks Mobility Specialist 06/13/22, 11:23 AM

## 2022-06-14 DIAGNOSIS — E872 Acidosis, unspecified: Secondary | ICD-10-CM

## 2022-06-14 DIAGNOSIS — F10939 Alcohol use, unspecified with withdrawal, unspecified: Secondary | ICD-10-CM

## 2022-06-14 LAB — BASIC METABOLIC PANEL
Anion gap: 9 (ref 5–15)
BUN: 14 mg/dL (ref 6–20)
CO2: 24 mmol/L (ref 22–32)
Calcium: 9 mg/dL (ref 8.9–10.3)
Chloride: 103 mmol/L (ref 98–111)
Creatinine, Ser: 0.81 mg/dL (ref 0.61–1.24)
GFR, Estimated: >60 mL/min (ref >60)
Glucose, Bld: 139 mg/dL — ABNORMAL HIGH (ref 70–99)
Potassium: 3.8 mmol/L (ref 3.5–5.1)
Sodium: 136 mmol/L (ref 135–145)

## 2022-06-14 NOTE — Progress Notes (Signed)
Progress Note   Patient: Lawrence Rowe UEA:[id number] DOB: [birth year] DOA: 06/08/2022     6 DOS: the patient was seen and examined on 06/14/2022   Brief hospital course: [name] is a 52 year old male with history of alcohol dependence, hypertension They presented to the emergency department from home for chief concerns of tremors and elevated heart rate.  He normally drinks on a daily basis, last drink was 06/07/2022.  Drinks about 12 beers per day.   Vitals in the ED showed temperature of 98.1, respiration of 18, heart rate of 121, blood pressure 151/95, SpO2 100% on room air.   Serum sodium is 138, potassium 4.8, chloride 104, bicarb 13, BUN of 8, serum creatinine of 0.78, EGFR greater than 60, nonfasting blood glucose 101, WBC 9.2, hemoglobin 15.1, platelets of 88. Lactic acid is 5.7. Cxray non acute   ED treatment: Valium 10 mg IV one-time dose, CIWA precaution initiated, sodium chloride 1 L bolus x 2 were given.   5/31: Blood pressure mildly elevated at 169/93, persistent mild tachycardia.  Labs with platelets of 60 but all cell lines decreased so most likely some dilutional effect.  Macrocytosis.  Mild hypokalemia which is being repleted.  CIWA score of 10.   6/1: Vital stable, overnight becoming more confused and agitated.  Worsening ataxia.  Concern of alcohol related delirium and/or Warnicke.  Starting him on phenobarbital taper and high-dose thiamine.  Sitter at bedside.  PT ordered.   6/2: Patient remained ataxic and little shaky.  No hallucinations or confabulation.  CIWA of 17   6/3: Hemodynamically stable and overall improving, CIWA score of 2.  Original PT recommendations were to go to SNF but patient wants to go home stating that his ability to walk is improving.  Message sent to PT to continue working with him.  Patient need to stay in hospital until Friday to complete high-dose thiamine. Patient does not want to involve any family member at this time.    6/4: Continue to improve, planning to walk without walker tomorrow.  Would like to go home instead of rehab.  Will finish high-dose thiamine on Friday.  6/5: patient hypertensive but stable. Denies complaints. Physical independence continues to improve through work with PT and treatment. CIWA scores zero. Is on phenobarbital taper and high dose thiamine for suspected Wernicke treatment. No head imaging was done on admission. He appears to be oriented to self and place although decision making capacity may be impaired currently. Would recommend to reassess prior to dispo.   Assessment and Plan: Alcohol dependence  Alcohol withdrawal with inpatient treatment [facility])  concern for possible Wernicke encephalopathy on admission. No confabulations on exam today.  Overall improving, CIWA score of 0. Last ativan PRN given 6/2.  Tremors and ataxia improving.  -Continue with high-dose thiamine, will complete the course 6/7 -He has been on a Phenobarbital taper which I will continue -Continue with PT/OT - assess for capacity prior to dc planning  Primary hypertension- poorly controlled.  -Continue home metoprolol -losartan started inpatient and steadily increased as needed. 50mg  started on 6/4. Monitor and increase PRN. Metabolic panel 6/5 normal  High anion gap metabolic acidosis- Resolved with bicarb infusion.  Transaminitis AST/ALT elevated by 2-1, consistent with history of alcohol use  Subjective: Patient reports no concerns. He states he is able to ambulate to bedside sink without assistance.   Physical Exam: Vitals:   06/13/22 1916 06/14/22 0126 06/14/22 0326 06/14/22 0729  BP: (!) 146/96 (!) 150/95 132/83 Marland Kitchen)  167/104  Pulse: 68 64 (!) 59 75  Resp: 16 17 16 16   Temp: 97.9 F (36.6 C) 97.6 F (36.4 C) (!) 97.5 F (36.4 C)   TempSrc: Oral Oral Oral   SpO2: 98% 100% 100% 100%  Weight:      Height:       General.  Alert and oriented x2, in no acute distress. Pulmonary.  Lungs clear  bilaterally, normal respiratory effort. CV.  Regular rate and rhythm, no JVD, rub or murmur. Abdomen.  Soft, nontender, nondistended, BS positive. CNS.  Alert and oriented .  No focal neurologic deficit. Extremities.  No edema, no cyanosis, pulses intact and symmetrical.  Data Reviewed: Prior data reviewed  Family Communication: Discussed with patient, patient does not want me to call any family member.  Disposition: Status is: Inpatient Remains inpatient appropriate because: Severity of illness  Planned Discharge Destination: Home  DVT prophylaxis.  Lovenox Time spent: 45 minutes   Author: Leeroy Bock, MD 06/14/2022 7:58 AM  For on call review [email or web address].

## 2022-06-14 NOTE — Progress Notes (Signed)
Physical Therapy Treatment Patient Details Name: Lawrence Rowe MRN: [id number] DOB: September 06, 1970 Today's Date: 06/14/2022   History of Present Illness presented to ER secondary to worsening tremors, elevated HR and difficulty walking; admitted for management of ETOH withdrawal requiring inpatient treatment     PT Comments    Pt received in Semi-Fowler's position and agreeable to therapy.  Pt notes he has been ambulating well today without an AD.  Pt performed well with ambulation and was able to ambulate around the nursing station and then go up/down stairs to mimic his home setup with railing only on the R side.  Pt able to ascend/descend 2 flights of steps with CGA.  Pt then performed balance training in the hallway with NBOS, tandem stance and ambulation with NBOS, each with 30 sec bouts.  Pt is progressing well and will continue to benefit from increased balance related tasks.     Recommendations for follow up therapy are one component of a multi-disciplinary discharge planning process, led by the attending physician.  Recommendations may be updated based on patient status, additional functional criteria and insurance authorization.  Follow Up Recommendations  Can patient physically be transported by private vehicle: Yes    Assistance Recommended at Discharge Frequent or constant Supervision/Assistance  Patient can return home with the following A lot of help with walking and/or transfers;A lot of help with bathing/dressing/bathroom   Equipment Recommendations  Rolling walker (2 wheels)    Recommendations for Other Services       Precautions / Restrictions Precautions Precautions: Fall Restrictions Weight Bearing Restrictions: No     Mobility  Bed Mobility Overal bed mobility: Needs Assistance Bed Mobility: Supine to Sit, Sit to Supine     Supine to sit: Supervision Sit to supine: Supervision        Transfers Overall transfer level: Needs  assistance Equipment used: None Transfers: Sit to/from Stand Sit to Stand: Supervision Stand pivot transfers: Supervision         General transfer comment: pt with much better impulsiveness during session and also much more steady with ambulation attempt without AD.    Ambulation/Gait Ambulation/Gait assistance: Min guard Gait Distance (Feet): 200 Feet Assistive device: None Gait Pattern/deviations: Step-through pattern Gait velocity: decreased     General Gait Details: one episode of unsteadiness that was self-corrected.   Stairs             Wheelchair Mobility    Modified Rankin (Stroke Patients Only)       Balance Overall balance assessment: Needs assistance Sitting-balance support: No upper extremity supported, Feet supported Sitting balance-Leahy Scale: Good     Standing balance support: Bilateral upper extremity supported, Reliant on assistive device for balance Standing balance-Leahy Scale: Fair                              Cognition Arousal/Alertness: Awake/alert Behavior During Therapy: WFL for tasks assessed/performed Overall Cognitive Status: No family/caregiver present to determine baseline cognitive functioning                                 General Comments: patient is able to follow single step commands consistently. he was able to correctly tell me about this morning's events (walking in the hallway 2 laps with assistance)        Exercises      General Comments  Pertinent Vitals/Pain Pain Assessment Pain Assessment: No/denies pain    Home Living                          Prior Function            PT Goals (current goals can now be found in the care plan section) Acute Rehab PT Goals Patient Stated Goal: to go home PT Goal Formulation: With patient Time For Goal Achievement: 06/25/22 Potential to Achieve Goals: Good Progress towards PT goals: Progressing toward goals     Frequency    Min 3X/week      PT Plan Current plan remains appropriate    Co-evaluation              AM-PAC PT "6 Clicks" Mobility   Outcome Measure  Help needed turning from your back to your side while in a flat bed without using bedrails?: None Help needed moving from lying on your back to sitting on the side of a flat bed without using bedrails?: None Help needed moving to and from a bed to a chair (including a wheelchair)?: A Little Help needed standing up from a chair using your arms (e.g., wheelchair or bedside chair)?: A Little Help needed to walk in hospital room?: A Little Help needed climbing 3-5 steps with a railing? : A Little 6 Click Score: 20    End of Session Equipment Utilized During Treatment: Gait belt Activity Tolerance: Patient tolerated treatment well Patient left: in bed;with call bell/phone within reach;with nursing/sitter in room Nurse Communication: Mobility status PT Visit Diagnosis: Muscle weakness (generalized) (M62.81);Difficulty in walking, not elsewhere classified (R26.2)     Time: 1610-9604 PT Time Calculation (min) (ACUTE ONLY): 11 min  Charges:  $Neuromuscular Re-education: 8-22 mins                     Nolon Bussing, PT, DPT Physical Therapist - [facility]  [facility]  06/14/22, 2:58 PM

## 2022-06-14 NOTE — Progress Notes (Signed)
Mobility Specialist - Progress Note   06/14/22 1500  Mobility  Activity Ambulated with assistance in hallway  Level of Assistance Standby assist, set-up cues, supervision of patient - no hands on  Assistive Device None  Distance Ambulated (ft) 360 ft  Activity Response Tolerated well  $Mobility charge 1 Mobility     Pt lying in bed upon arrival, utilizing RA. Agreeable to activity. Pt ambulated in hallway with supervision, no AD/LOB. Still mildly unsteady, but improved balance without RW compared to yesterday's session. No complaints. Pt left in bed with sitter present at exit.    Lawrence Rowe Mobility Specialist 06/14/22, 3:29 PM

## 2022-06-14 NOTE — Plan of Care (Signed)
Patient Lawrence Rowe, disoriented to time and situation.  VSS throughout shift.  Pt refused telemetry after much education.  Pt kept asking questions about how he got to hospital and where is his car.  Sitter present at bedside.  Diminished lungs, IS encouraged.  Pt voided in urinal.  POC maintained, will continue to monitor.  Problem: Education: Goal: Knowledge of General Education information will improve Description: Including pain rating scale, medication(s)/side effects and non-pharmacologic comfort measures Outcome: Progressing   Problem: Health Behavior/Discharge Planning: Goal: Ability to manage health-related needs will improve Outcome: Progressing   Problem: Clinical Measurements: Goal: Ability to maintain clinical measurements within normal limits will improve Outcome: Progressing Goal: Will remain free from infection Outcome: Progressing Goal: Diagnostic test results will improve Outcome: Progressing Goal: Respiratory complications will improve Outcome: Progressing Goal: Cardiovascular complication will be avoided Outcome: Progressing   Problem: Activity: Goal: Risk for activity intolerance will decrease Outcome: Progressing   Problem: Nutrition: Goal: Adequate nutrition will be maintained Outcome: Progressing   Problem: Coping: Goal: Level of anxiety will decrease Outcome: Progressing   Problem: Elimination: Goal: Will not experience complications related to bowel motility Outcome: Progressing Goal: Will not experience complications related to urinary retention Outcome: Progressing   Problem: Pain Managment: Goal: General experience of comfort will improve Outcome: Progressing   Problem: Safety: Goal: Ability to remain free from injury will improve Outcome: Progressing   Problem: Skin Integrity: Goal: Risk for impaired skin integrity will decrease Outcome: Progressing

## 2022-06-15 LAB — COMPREHENSIVE METABOLIC PANEL
ALT: 49 U/L — ABNORMAL HIGH (ref 0–44)
AST: 80 U/L — ABNORMAL HIGH (ref 15–41)
Albumin: 3.6 g/dL (ref 3.5–5.0)
Alkaline Phosphatase: 76 U/L (ref 38–126)
Anion gap: 10 (ref 5–15)
BUN: 11 mg/dL (ref 6–20)
CO2: 23 mmol/L (ref 22–32)
Calcium: 8.6 mg/dL — ABNORMAL LOW (ref 8.9–10.3)
Chloride: 96 mmol/L — ABNORMAL LOW (ref 98–111)
Creatinine, Ser: 0.75 mg/dL (ref 0.61–1.24)
GFR, Estimated: >60 mL/min (ref >60)
Glucose, Bld: 152 mg/dL — ABNORMAL HIGH (ref 70–99)
Potassium: 3.8 mmol/L (ref 3.5–5.1)
Sodium: 129 mmol/L — ABNORMAL LOW (ref 135–145)
Total Bilirubin: 1.6 mg/dL — ABNORMAL HIGH (ref 0.3–1.2)
Total Protein: 7.4 g/dL (ref 6.5–8.1)

## 2022-06-15 MED ORDER — AMLODIPINE BESYLATE 10 MG PO TABS
10.0000 mg | ORAL_TABLET | Freq: Every day | ORAL | Status: DC
  Administered 2022-06-15 – 2022-06-16 (×2): 10 mg via ORAL
  Filled 2022-06-15 (×2): qty 1

## 2022-06-15 NOTE — Progress Notes (Signed)
Progress Note   Patient: Lawrence Rowe YNW:[id number] DOB: [birth year] DOA: 06/08/2022     7 DOS: the patient was seen and examined on 06/15/2022   Brief hospital course: [name] is a 52 year old male with history of alcohol dependence, hypertension They presented to the emergency department from home for chief concerns of tremors and elevated heart rate.  He normally drinks on a daily basis, last drink was 06/07/2022.  Drinks about 12 beers per day.   Vitals in the ED showed temperature of 98.1, respiration of 18, heart rate of 121, blood pressure 151/95, SpO2 100% on room air.   Serum sodium is 138, potassium 4.8, chloride 104, bicarb 13, BUN of 8, serum creatinine of 0.78, EGFR greater than 60, nonfasting blood glucose 101, WBC 9.2, hemoglobin 15.1, platelets of 88. Lactic acid is 5.7. Cxray non acute   ED treatment: Valium 10 mg IV one-time dose, CIWA precaution initiated, sodium chloride 1 L bolus x 2 were given.   5/31: Blood pressure mildly elevated at 169/93, persistent mild tachycardia.  Labs with platelets of 60 but all cell lines decreased so most likely some dilutional effect.  Macrocytosis.  Mild hypokalemia which is being repleted.  CIWA score of 10.   6/1: Vital stable, overnight becoming more confused and agitated.  Worsening ataxia.  Concern of alcohol related delirium and/or Warnicke.  Starting him on phenobarbital taper and high-dose thiamine.  Sitter at bedside.  PT ordered.   6/2: Patient remained ataxic and little shaky.  No hallucinations or confabulation.  CIWA of 17   6/3: Hemodynamically stable and overall improving, CIWA score of 2.  Original PT recommendations were to go to SNF but patient wants to go home stating that his ability to walk is improving.  Message sent to PT to continue working with him.  Patient need to stay in hospital until Friday to complete high-dose thiamine. Patient does not want to involve any family member at this time.    6/4: Continue to improve, planning to walk without walker tomorrow.  Would like to go home instead of rehab.  Will finish high-dose thiamine on Friday.  6/5: patient hypertensive but stable. Denies complaints. Physical independence continues to improve through work with PT and treatment. CIWA scores zero. Is on phenobarbital taper and high dose thiamine for suspected Wernicke treatment. No head imaging was done on admission. He appears to be oriented to self and place although decision making capacity may be impaired currently. Would recommend to reassess prior to dispo.   6/6: Patient's blood pressure has been elevated, added amlodipine to the regimen, patient was complaining that his fluids have been restricted.  Nurse reported patient has been drinking excessive coffee and water and as result his sodium levels have dipped to 126 today.  The patient on fluid restriction of 1500 mL.  Assessment and Plan: Alcohol dependence  Alcohol withdrawal with inpatient treatment [facility])  concern for possible Wernicke encephalopathy on admission. No confabulations on exam today.  Overall improving, CIWA score of 0. Last ativan PRN given 6/2.  Tremors and ataxia improving.  -Continue with high-dose thiamine, will complete the course 6/7 -He has been on a Phenobarbital taper which I will continue, no withdrawal symptoms noted -Continue with PT/OT - assess for capacity prior to dc planning  Primary hypertension- poorly controlled.  -Continue home metoprolol -losartan started inpatient and steadily increased as needed. 50mg  started on 6/4. Monitor and increase PRN. Metabolic panel 6/5 normal -Started the patient on amlodipine along  with losartan blood pressure much improved  High anion gap metabolic acidosis- Resolved with bicarb infusion.  Hyponatremia: Secondary to polydipsia - Current sodium level 126 - Patient started on fluid restriction of 1500 mL. - Patient also has been consulted on  this.  Transaminitis AST/ALT elevated by 2-1, consistent with history of alcohol use  Subjective: Seen and examined at bedside today.  Patient complaining that the nurses have been restricting his fluid intake.  Reported patient has been drinking multiple cups of coffee, water excessively.  physical Exam: Vitals:   06/15/22 0221 06/15/22 0808 06/15/22 1154 06/15/22 1602  BP: (!) 152/99 125/82 120/86 102/67  Pulse: 81 66 71 72  Resp: 18 16 16 16   Temp: (!) 97.5 F (36.4 C) 97.6 F (36.4 C)  98.6 F (37 C)  TempSrc:      SpO2: 100% 100% 97% 97%  Weight:      Height:       General.  Alert and oriented x2, in no acute distress. Pulmonary.  Lungs clear bilaterally, normal respiratory effort. CV.  Regular rate and rhythm, no JVD, rub or murmur. Abdomen.  Soft, nontender, nondistended, BS positive. CNS.  Alert and oriented .  No focal neurologic deficit. Extremities.  No edema, no cyanosis, pulses intact and symmetrical.  Data Reviewed: Prior data reviewed  Family Communication: Discussed with patient, patient does not want me to call any family member.  Disposition: Status is: Inpatient Remains inpatient appropriate because: Severity of illness  Planned Discharge Destination: Home  DVT prophylaxis.  Lovenox Time spent: 45 minutes   Author: Harold Hedge, MD 06/15/2022 4:02 PM  For on call review [email or web address].

## 2022-06-15 NOTE — Plan of Care (Addendum)
Patient AOX2, disoriented to time and situation. VSS throughout shift. Pt refused telemetry after much education. Pt kept walking around unit and asking for late night snacks and drinks. Diminished lungs, IS encouraged. Pt voided in urinal. POC maintained, will continue to monitor.   Problem: Education: Goal: Knowledge of General Education information will improve Description: Including pain rating scale, medication(s)/side effects and non-pharmacologic comfort measures Outcome: Progressing   Problem: Health Behavior/Discharge Planning: Goal: Ability to manage health-related needs will improve Outcome: Progressing   Problem: Clinical Measurements: Goal: Ability to maintain clinical measurements within normal limits will improve Outcome: Progressing Goal: Will remain free from infection Outcome: Progressing Goal: Diagnostic test results will improve Outcome: Progressing Goal: Respiratory complications will improve Outcome: Progressing Goal: Cardiovascular complication will be avoided Outcome: Progressing   Problem: Activity: Goal: Risk for activity intolerance will decrease Outcome: Progressing   Problem: Nutrition: Goal: Adequate nutrition will be maintained Outcome: Progressing   Problem: Coping: Goal: Level of anxiety will decrease Outcome: Progressing   Problem: Elimination: Goal: Will not experience complications related to bowel motility Outcome: Progressing Goal: Will not experience complications related to urinary retention Outcome: Progressing   Problem: Pain Managment: Goal: General experience of comfort will improve Outcome: Progressing   Problem: Safety: Goal: Ability to remain free from injury will improve Outcome: Progressing   Problem: Skin Integrity: Goal: Risk for impaired skin integrity will decrease Outcome: Progressing

## 2022-06-15 NOTE — Progress Notes (Signed)
PT Cancellation Note  Patient Details Name: Lawrence Rowe MRN: [id number] DOB: [birth year]   Cancelled Treatment:    Reason Eval/Treat Not Completed: Other (comment).  Chart reviewed and attempted to see pt.  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  Will re-attempt at later date/time as medically appropriate.   Nolon Bussing, PT, DPT Physical Therapist - [facility]  06/15/22, 3:53 PM

## 2022-06-16 DIAGNOSIS — F10939 Alcohol use, unspecified with withdrawal, unspecified: Secondary | ICD-10-CM

## 2022-06-16 LAB — BASIC METABOLIC PANEL
Anion gap: 7 (ref 5–15)
BUN: 11 mg/dL (ref 6–20)
CO2: 27 mmol/L (ref 22–32)
Calcium: 9 mg/dL (ref 8.9–10.3)
Chloride: 103 mmol/L (ref 98–111)
Creatinine, Ser: 0.7 mg/dL (ref 0.61–1.24)
GFR, Estimated: >60 mL/min (ref >60)
Glucose, Bld: 118 mg/dL — ABNORMAL HIGH (ref 70–99)
Potassium: 4.2 mmol/L (ref 3.5–5.1)
Sodium: 137 mmol/L (ref 135–145)

## 2022-06-16 MED ORDER — LOSARTAN POTASSIUM 50 MG PO TABS: 50.0000 mg | ORAL_TABLET | Freq: Every day | ORAL | 1 refills | Status: DC

## 2022-06-16 MED ORDER — METOPROLOL SUCCINATE ER 50 MG PO TB24
50.0000 mg | ORAL_TABLET | Freq: Every day | ORAL | 3 refills | Status: DC
Start: 2022-06-16 — End: 2023-11-07

## 2022-06-16 MED ORDER — THIAMINE HCL 100 MG PO TABS: 100.0000 mg | ORAL_TABLET | Freq: Every day | ORAL | 1 refills | Status: DC

## 2022-06-16 MED ORDER — AMLODIPINE BESYLATE 10 MG PO TABS: 10.0000 mg | ORAL_TABLET | Freq: Every day | ORAL | 1 refills | Status: DC

## 2022-06-16 MED ORDER — FOLIC ACID 1 MG PO TABS: 1.0000 mg | ORAL_TABLET | Freq: Every day | ORAL | 1 refills | Status: DC

## 2022-06-16 NOTE — TOC Transition Note (Addendum)
Transition of Care [facility] [place]) - CM/SW Discharge Note   Patient Details  Name: Lawrence Rowe MRN: [id number] Date of Birth: 1970-11-22  Transition of Care [facility]) CM/SW Contact:  Truddie Hidden, RN Phone Number: 06/16/2022, 2:24 PM   Clinical Narrative:    Patient is agreeable to PT/ OT per MD.  Patient is  medicaid pending.  Referral sent to [facility] [place] via charity rotation.   3:06pm Patient discharged. Spoke with patient's mother.  Patient's phone is not working. He is in the process of getting another phone. Cory from [facility] updated with how to contact patient for [facility].   TOC signing off.           Patient Goals and CMS Choice      Discharge Placement                         Discharge Plan and Services Additional resources added to the After Visit Summary for                                       Social Determinants of Health (SDOH) Interventions SDOH Screenings   Food Insecurity: No Food Insecurity (06/09/2022)  Housing: Low Risk  (06/09/2022)  Transportation Needs: No Transportation Needs (06/09/2022)  Utilities: Not At Risk (06/09/2022)  Depression (PHQ2-9): Low Risk  (04/19/2021)  Tobacco Use: Medium Risk (06/09/2022)     Readmission Risk Interventions     No data to display

## 2022-06-16 NOTE — Discharge Summary (Signed)
Lawrence Rowe ZOX:[id number] DOB: 1970/12/27 DOA: 06/08/2022  PCP: Baldwin Jamaica, MD  Admit date: 06/08/2022 Discharge date: 06/16/2022  Time spent: 35 minutes  Recommendations for Outpatient Follow-up:  Pcp f/u     Discharge Diagnoses:  Principal Problem:   Alcohol withdrawal with inpatient treatment [facility]) Active Problems:   Alcohol withdrawal (HCC)   Alcohol abuse   Primary hypertension   PAD (peripheral artery disease) (HCC)   Transaminitis   High anion gap metabolic acidosis   Lactic acidosis   Discharge Condition: stable  Diet recommendation: heart healthy  Filed Weights   06/08/22 1418  Weight: 98.5 kg    History of present illness:  From admission h and p Lawrence Rowe is a 53 year old male with history of alcohol abuse, hypertension, who presents emergency department for chief concerns of tremors and elevated heart rate.   Vitals in the ED showed temperature of 98.1, respiration of 18, heart rate of 121, blood pressure 151/95, SpO2 100% on room air.   Serum sodium is 138, potassium 4.8, chloride 104, bicarb 13, BUN of 8, serum creatinine of 0.78, EGFR greater than 60, nonfasting blood glucose 101, WBC 9.2, hemoglobin 15.1, platelets of 88.   Lactic acid is 5.7.   ED treatment: Valium 10 mg IV one-time dose, CIWA precaution initiated, sodium chloride 1 L bolus x 2 were given. -------------------------- At bedside, patient is able to tell me his name, age, location, current calendar year.  Patient is tremulous at bedside.   He reports that at baseline he drinks 12 cans (12 oz) beers per day.  His last drink was at 9 PM on 3/39/24.  He reports he normally starts drinking around 3 to 4 PM.  Today at approximately 10 AM, he felt tremulous while running errands.  He denies chest pain, shortness of breath, dysuria, hematuria, diarrhea, swelling of his lower extremities.   He reports he does not eat often, the last time he ate was 05/29/2022.   Social  history: He lives by self.  He denies tobacco and recreational drug use.  He drinks 12 beers per day.  Hospital Course:   Alcohol dependence  Alcohol withdrawal with inpatient treatment [facility])  concern for possible Wernicke encephalopathy on admission. No confabulations on exam today.  Overall improving, CIWA score of 0. ativan PRN given 6/2.  Tremors and ataxia much improved.  -Continue with high-dose thiamine, will completed the course 6/7 -finished phenobarb taper - declines snf, amenable to pt/ot at home   Primary hypertension History tachycardia - poorly controlled.  -Continue home metoprolol -losartan started inpatient and steadily increased as needed. 50mg  started on 6/4. Monitor and increase PRN. Metabolic panel 6/5 normal -Started the patient on amlodipine along with losartan blood pressure much improved   Transaminitis AST/ALT elevated by 2-1, consistent with history of alcohol use  Procedures: none   Consultations: none  Discharge Exam: Vitals:   06/16/22 0320 06/16/22 0756  BP: 110/74 120/77  Pulse: 73 62  Resp: 18   Temp: 98.1 F (36.7 C) 97.9 F (36.6 C)  SpO2: 100% 99%    General: NAD Cardiovascular: RRR Respiratory: CTAB Neuro: no tremor  Discharge Instructions   Discharge Instructions     Diet - low sodium heart healthy   Complete by: As directed    Increase activity slowly   Complete by: As directed       Allergies as of 06/16/2022       Reactions   Penicillins Other (See Comments)  Medication List     TAKE these medications    amLODipine 10 MG tablet Commonly known as: NORVASC Take 1 tablet (10 mg total) by mouth daily. Start taking on: June 17, 2022   folic acid 1 MG tablet Commonly known as: FOLVITE Take 1 tablet (1 mg total) by mouth daily.   losartan 50 MG tablet Commonly known as: COZAAR Take 1 tablet (50 mg total) by mouth daily. Start taking on: June 17, 2022   metoprolol succinate 50 MG 24 hr  tablet Commonly known as: TOPROL-XL Take 1 tablet (50 mg total) by mouth daily. Take with or immediately following a meal.   thiamine 100 MG tablet Commonly known as: VITAMIN B1 Take 1 tablet (100 mg total) by mouth daily.       Allergies  Allergen Reactions   Penicillins Other (See Comments)      The results of significant diagnostics from this hospitalization (including imaging, microbiology, ancillary and laboratory) are listed below for reference.    Significant Diagnostic Studies: DG Chest Port 1 View  Result Date: 06/08/2022 CLINICAL DATA:  10026 Shortness of breath 10026 EXAM: PORTABLE CHEST 1 VIEW COMPARISON:  Chest x-ray 12/23/2021 FINDINGS: The heart and mediastinal contours are unchanged. No focal consolidation. No pulmonary edema. No pleural effusion. No pneumothorax. No acute osseous abnormality. IMPRESSION: No active disease. Electronically Signed   By: Tish Frederickson M.D.   On: 06/08/2022 17:54    Microbiology: No results found for this or any previous visit (from the past 240 hour(s)).   Labs: Basic Metabolic Panel: Recent Labs  Lab 06/10/22 0935 06/14/22 0912 06/15/22 0858 06/16/22 1046  NA 133* 136 129* 137  K 3.7 3.8 3.8 4.2  CL 101 103 96* 103  CO2 21* 24 23 27   GLUCOSE 121* 139* 152* 118*  BUN 9 14 11 11   CREATININE 0.63 0.81 0.75 0.70  CALCIUM 8.6* 9.0 8.6* 9.0   Liver Function Tests: Recent Labs  Lab 06/15/22 0858  AST 80*  ALT 49*  ALKPHOS 76  BILITOT 1.6*  PROT 7.4  ALBUMIN 3.6   No results for input(s): "LIPASE", "AMYLASE" in the last 168 hours. No results for input(s): "AMMONIA" in the last 168 hours. CBC: Recent Labs  Lab 06/10/22 0935 06/13/22 0507  WBC 4.3 5.8  HGB 13.0 13.6  HCT 38.0* 39.7  MCV 102.2* 104.2*  PLT 65* 93*   Cardiac Enzymes: No results for input(s): "CKTOTAL", "CKMB", "CKMBINDEX", "TROPONINI" in the last 168 hours. BNP: BNP (last 3 results) Recent Labs    01/13/22 0641  BNP 174.6*    ProBNP  (last 3 results) No results for input(s): "PROBNP" in the last 8760 hours.  CBG: No results for input(s): "GLUCAP" in the last 168 hours.     Signed:  Silvano Bilis MD.  [facility] 06/16/2022, 12:49 PM

## 2022-06-19 ENCOUNTER — Emergency Department
Admission: EM | Admit: 2022-06-19 | Discharge: 2022-06-19 | Disposition: A | Discharge status: Home / Self Care | Payer: Medicaid Other | Attending: Emergency Medicine | Admitting: Emergency Medicine

## 2022-06-19 ENCOUNTER — Other Ambulatory Visit: Payer: Self-pay

## 2022-06-19 ENCOUNTER — Emergency Department: Payer: Medicaid Other

## 2022-06-19 DIAGNOSIS — K2900 Acute gastritis without bleeding: Secondary | ICD-10-CM | POA: Insufficient documentation

## 2022-06-19 DIAGNOSIS — R252 Cramp and spasm: Secondary | ICD-10-CM | POA: Diagnosis not present

## 2022-06-19 DIAGNOSIS — E860 Dehydration: Secondary | ICD-10-CM | POA: Diagnosis not present

## 2022-06-19 DIAGNOSIS — R1084 Generalized abdominal pain: Secondary | ICD-10-CM | POA: Diagnosis present

## 2022-06-19 LAB — CBC WITH DIFFERENTIAL/PLATELET
Abs Immature Granulocytes: 0.01 10*3/uL (ref 0.00–0.07)
Basophils Absolute: 0.1 10*3/uL (ref 0.0–0.1)
Basophils Relative: 1 %
Eosinophils Absolute: 0 10*3/uL (ref 0.0–0.5)
Eosinophils Relative: 0 %
HCT: 40.6 % (ref 39.0–52.0)
Hemoglobin: 14.2 g/dL (ref 13.0–17.0)
Immature Granulocytes: 0 %
Lymphocytes Relative: 14 %
Lymphs Abs: 1.1 10*3/uL (ref 0.7–4.0)
MCH: 36 pg — ABNORMAL HIGH (ref 26.0–34.0)
MCHC: 35 g/dL (ref 30.0–36.0)
MCV: 103 fL — ABNORMAL HIGH (ref 80.0–100.0)
Monocytes Absolute: 1.4 10*3/uL — ABNORMAL HIGH (ref 0.1–1.0)
Monocytes Relative: 17 %
Neutro Abs: 5.5 10*3/uL (ref 1.7–7.7)
Neutrophils Relative %: 68 %
Platelets: 199 10*3/uL (ref 150–400)
RBC: 3.94 MIL/uL — ABNORMAL LOW (ref 4.22–5.81)
RDW: 14 % (ref 11.5–15.5)
WBC: 8.1 10*3/uL (ref 4.0–10.5)
nRBC: 0 % (ref 0.0–0.2)

## 2022-06-19 LAB — COMPREHENSIVE METABOLIC PANEL
ALT: 140 U/L — ABNORMAL HIGH (ref 0–44)
AST: 202 U/L — ABNORMAL HIGH (ref 15–41)
Albumin: 3.8 g/dL (ref 3.5–5.0)
Alkaline Phosphatase: 67 U/L (ref 38–126)
Anion gap: 12 (ref 5–15)
BUN: 6 mg/dL (ref 6–20)
CO2: 20 mmol/L — ABNORMAL LOW (ref 22–32)
Calcium: 8.8 mg/dL — ABNORMAL LOW (ref 8.9–10.3)
Chloride: 105 mmol/L (ref 98–111)
Creatinine, Ser: 0.6 mg/dL — ABNORMAL LOW (ref 0.61–1.24)
GFR, Estimated: >60 mL/min (ref >60)
Glucose, Bld: 112 mg/dL — ABNORMAL HIGH (ref 70–99)
Potassium: 3.7 mmol/L (ref 3.5–5.1)
Sodium: 137 mmol/L (ref 135–145)
Total Bilirubin: 1.2 mg/dL (ref 0.3–1.2)
Total Protein: 8 g/dL (ref 6.5–8.1)

## 2022-06-19 LAB — URINALYSIS, ROUTINE W REFLEX MICROSCOPIC
Bilirubin Urine: NEGATIVE
Glucose, UA: NEGATIVE mg/dL
Hgb urine dipstick: NEGATIVE
Ketones, ur: 20 mg/dL — AB
Leukocytes,Ua: NEGATIVE
Nitrite: NEGATIVE
Protein, ur: NEGATIVE mg/dL
Specific Gravity, Urine: >1.046 — ABNORMAL HIGH (ref 1.005–1.030)
pH: 5 (ref 5.0–8.0)

## 2022-06-19 LAB — LIPASE, BLOOD: Lipase: 47 U/L (ref 11–51)

## 2022-06-19 MED ORDER — ONDANSETRON HCL 4 MG/2ML IJ SOLN
4.0000 mg | Freq: Once | INTRAMUSCULAR | Status: AC
  Administered 2022-06-19: 4 mg via INTRAVENOUS
  Filled 2022-06-19: qty 2

## 2022-06-19 MED ORDER — IOHEXOL 300 MG/ML  SOLN
100.0000 mL | Freq: Once | INTRAMUSCULAR | Status: AC | PRN
  Administered 2022-06-19: 100 mL via INTRAVENOUS

## 2022-06-19 MED ORDER — SODIUM CHLORIDE 0.9 % IV BOLUS
2000.0000 mL | Freq: Once | INTRAVENOUS | Status: AC
  Administered 2022-06-19: 2000 mL via INTRAVENOUS

## 2022-06-19 MED ORDER — OMEPRAZOLE 10 MG PO CPDR: 10.0000 mg | DELAYED_RELEASE_CAPSULE | Freq: Every day | ORAL | 3 refills | Status: DC

## 2022-06-19 NOTE — ED Provider Triage Note (Signed)
Emergency Medicine Provider Triage Evaluation Note  Lawrence Rowe , a 52 y.o. male  was evaluated in triage.  Pt complains of cramps and spasms, seen for withdrawals discharged yesterday, drank last night.  Review of Systems  Positive:  Negative:   Physical Exam  BP (!) 163/114 (BP Location: Left Arm)   Pulse (!) 128   Temp 98.5 F (36.9 C)   Resp 20   Ht 6\' 1"  (1.854 m)   Wt 98.5 kg   SpO2 99%   BMI 28.65 kg/m  Gen:   Awake, no distress   Resp:  Normal effort  MSK:   Moves extremities without difficulty  Other:    Medical Decision Making  Medically screening exam initiated at 1:11 PM.  Appropriate orders placed.  [name] was informed that the remainder of the evaluation will be completed by another provider, this initial triage assessment does not replace that evaluation, and the importance of remaining in the ED until their evaluation is complete.     Faythe Ghee, PA-C 06/19/22 1312

## 2022-06-19 NOTE — ED Triage Notes (Signed)
By ems --discharged yesterday.  Abdominal pain 7/10.  Hasn't eating since yeseteray.  12 pack last night.  153/111  98 RA  [id number]

## 2022-06-19 NOTE — ED Provider Notes (Signed)
[facility] Provider Note  Patient Contact: 3:51 PM (approximate)   History   Abdominal Pain and Foot Pain   HPI  Lawrence Rowe is a 52 y.o. male who presents emergency complaining of diffuse abdominal pain, reports of muscle cramping in his hands and feet.  Patient was recently admitted to the hospital for lactic acidosis, transaminitis and alcohol withdrawal.  Patient had spent the better part of the week admitted.  Went home, states that he has been drinking heavily, had "at least" a 12 pack of beer last night at 11:00.  Patient is not eating other than "just a granola bar."  Patient denies fevers, chills, emesis, diarrhea, constipation, urinary changes.  Patient is here for diffuse abdominal pain and muscle cramping.     Physical Exam   Triage Vital Signs: ED Triage Vitals  Enc Vitals Group     BP 06/19/22 1304 (!) 163/114     Pulse Rate 06/19/22 1304 (!) 128     Resp 06/19/22 1304 20     Temp 06/19/22 1304 98.5 F (36.9 C)     Temp src --      SpO2 06/19/22 1304 99 %     Weight 06/19/22 1305 217 lb 2.5 oz (98.5 kg)     Height 06/19/22 1305 6\' 1"  (1.854 m)     Head Circumference --      Peak Flow --      Pain Score 06/19/22 1305 7     Pain Loc --      Pain Edu? --      Excl. in GC? --     Most recent vital signs: Vitals:   06/19/22 1554 06/19/22 1825  BP: (!) 160/100 (!) 150/92  Pulse: (!) 110 90  Resp: 20 20  Temp:  98 F (36.7 C)  SpO2: 99% 99%     General: Alert and in no acute distress.  Cardiovascular:  Good peripheral perfusion Respiratory: Normal respiratory effort without tachypnea or retractions. Lungs CTAB. Good air entry to the bases with no decreased or absent breath sounds. Gastrointestinal: Bowel sounds 4 quadrants.  Soft to palpation all quadrants.  Slightly tender in all quadrants without point specific tenderness.. No guarding or rigidity. No palpable masses. No distention. No CVA  tenderness. Musculoskeletal: Full range of motion to all extremities.  Neurologic:  No gross focal neurologic deficits are appreciated.  Skin:   No rash noted Other:   ED Results / Procedures / Treatments   Labs (all labs ordered are listed, but only abnormal results are displayed) Labs Reviewed  COMPREHENSIVE METABOLIC PANEL - Abnormal; Notable for the following components:      Result Value   CO2 20 (*)    Glucose, Bld 112 (*)    Creatinine, Ser 0.60 (*)    Calcium 8.8 (*)    AST 202 (*)    ALT 140 (*)    All other components within normal limits  CBC WITH DIFFERENTIAL/PLATELET - Abnormal; Notable for the following components:   RBC 3.94 (*)    MCV 103.0 (*)    MCH 36.0 (*)    Monocytes Absolute 1.4 (*)    All other components within normal limits  URINALYSIS, ROUTINE W REFLEX MICROSCOPIC - Abnormal; Notable for the following components:   Color, Urine YELLOW (*)    APPearance CLEAR (*)    Specific Gravity, Urine >1.046 (*)    Ketones, ur 20 (*)    All other components within normal limits  LIPASE, BLOOD     EKG     RADIOLOGY  I personally viewed, evaluated, and interpreted these images as part of my medical decision making, as well as reviewing the written report by the radiologist.  ED Provider Interpretation: Slight nonspecific gastric wall thickening, slight bladder thickening with no evidence of UTI on CT.  Suspect interstitial cystitis from slight dehydration  CT ABDOMEN PELVIS W CONTRAST  Result Date: 06/19/2022 CLINICAL DATA:  Acute abdominal pain. EXAM: CT ABDOMEN AND PELVIS WITH CONTRAST TECHNIQUE: Multidetector CT imaging of the abdomen and pelvis was performed using the standard protocol following bolus administration of intravenous contrast. RADIATION DOSE REDUCTION: This exam was performed according to the departmental dose-optimization program which includes automated exposure control, adjustment of the mA and/or kV according to patient size and/or  use of iterative reconstruction technique. CONTRAST:  OMNIPAQUE IOHEXOL 300 MG/ML  SOLN COMPARISON:  None Available. FINDINGS: Lower chest: No acute findings allowing for motion artifact. Hepatobiliary: There is subtle capsular nodularity in left lobe hypertrophy suspicious for cirrhosis. No evidence of focal liver lesion. Gallbladder physiologically distended, no calcified stone. No biliary dilatation. Pancreas: No ductal dilatation or inflammation. Spleen: Mild splenomegaly, spleen spans 13.5 cm cranial caudal. No focal splenic abnormality. Adrenals/Urinary Tract: No adrenal nodule. No hydronephrosis or perinephric edema. Homogeneous renal enhancement with symmetric excretion on delayed phase imaging. No renal calculi or focal renal abnormality. Moderate bladder wall thickening. Stomach/Bowel: Motion artifact through the upper abdomen limits assessment of the stomach. Allowing for this there is equivocal gastric wall thickening. No additional bowel inflammation. No obstruction. Normal appendix. Moderate volume of colonic stool. Vascular/Lymphatic: Aortic atherosclerosis without aneurysm. Patent portal vein. No bulky abdominopelvic adenopathy. Reproductive: Prostate is unremarkable. Other: Minimal fat in the inguinal canals. No ascites or free air. Musculoskeletal: There are no acute or suspicious osseous abnormalities. IMPRESSION: 1. Moderate bladder wall thickening, can be seen with cystitis. Recommend correlation with urinalysis. 2. Equivocal gastric wall thickening. 3. Subtle capsular nodularity and left lobe hypertrophy of the liver suspicious for cirrhosis. Mild splenomegaly. Aortic Atherosclerosis (ICD10-I70.0). Electronically Signed   By: Narda Rutherford M.D.   On: 06/19/2022 17:47    PROCEDURES:  Critical Care performed: No  Procedures   MEDICATIONS ORDERED IN ED: Medications  sodium chloride 0.9 % bolus 2,000 mL (0 mLs Intravenous Stopped 06/19/22 1825)  ondansetron (ZOFRAN) injection 4  mg (4 mg Intravenous Given 06/19/22 1607)  iohexol (OMNIPAQUE) 300 MG/ML solution 100 mL (100 mLs Intravenous Contrast Given 06/19/22 1638)     IMPRESSION / MDM / ASSESSMENT AND PLAN / ED COURSE  I reviewed the triage vital signs and the nursing notes.                                 Differential diagnosis includes, but is not limited to, alcohol poisoning, gastritis, pancreatitis, cholecystitis, colitis, dehydration, hypokalemia   Patient's presentation is most consistent with acute presentation with potential threat to life or bodily function.   Patient's diagnosis is consistent with gastritis.  Patient presents emergency department complaining of epigastric abdominal pain and some cramping in his legs and feet.  Patient was admitted for a week for alcohol withdrawal, elevated lactic acid, dehydration.  Patient states that he was discharged yesterday, went to start drinking heavily.  He admits to a 12 pack of alcohol around 11:00 last night.  Patient awoke with generalized epigastric/abdominal pain with some cramping in his hands and feet.  No other symptoms.  Labs are reassuring.  CT scan revealed some gastric wall thickening and bladder wall thickening.  Suspect some mild interstitial cystitis as there is no signs of infection on urinalysis.  Given the slight gastric thickening I suspect a little bit of gastritis secondary to alcohol use.  Patient still has transaminitis though this appears to be chronic.  At this time patient will be discharged with omeprazole, we had a long talk about his alcohol use as well as drinking other fluids, eating.  Patient is agreeable with this plan.  Patient stable for discharge at this time..  Patient is given ED precautions to return to the ED for any worsening or new symptoms.     FINAL CLINICAL IMPRESSION(S) / ED DIAGNOSES   Final diagnoses:  Acute gastritis without hemorrhage, unspecified gastritis type  Dehydration     Rx / DC Orders   ED  Discharge Orders          Ordered    omeprazole (PRILOSEC) 10 MG capsule  Daily        06/19/22 2039             Note:  This document was prepared using Dragon voice recognition software and may include unintentional dictation errors.   Lanette Hampshire 06/19/22 2042    Pilar Jarvis, MD 06/19/22 [phone]

## 2022-06-19 NOTE — ED Triage Notes (Signed)
Pt arrives via EMS w/ c/o generalized abd pain and feet cramping that apparently has not resolved. Pt endorsing last drink was last night, had a 12 pack of beer.

## 2022-06-20 ENCOUNTER — Emergency Department
Admission: EM | Admit: 2022-06-20 | Discharge: 2022-06-20 | Discharge status: Against Medical Advice | Payer: Medicaid Other | Attending: Emergency Medicine | Admitting: Emergency Medicine

## 2022-06-20 ENCOUNTER — Other Ambulatory Visit: Payer: Self-pay

## 2022-06-20 ENCOUNTER — Encounter: Payer: Self-pay | Admitting: Emergency Medicine

## 2022-06-20 DIAGNOSIS — I10 Essential (primary) hypertension: Secondary | ICD-10-CM | POA: Diagnosis not present

## 2022-06-20 DIAGNOSIS — Z5329 Procedure and treatment not carried out because of patient's decision for other reasons: Secondary | ICD-10-CM | POA: Diagnosis not present

## 2022-06-20 DIAGNOSIS — R000 Tachycardia, unspecified: Secondary | ICD-10-CM | POA: Insufficient documentation

## 2022-06-20 DIAGNOSIS — E860 Dehydration: Secondary | ICD-10-CM | POA: Diagnosis not present

## 2022-06-20 NOTE — Discharge Instructions (Signed)
It was highly recommended that you stay for laboratory testing, IV hydration, and further workup that may determine need for admission or not.  It is AGAINST MEDICAL ADVICE that you are leaving today.  Please return for any new, worsening, or change in symptoms or other concerns or if you change your mind.

## 2022-06-20 NOTE — ED Triage Notes (Signed)
Arrives via [facility].  Patient was walking around the road.  Called EMS because he was tired.  Patient wanted a ride home.  EMS unable to provide a ride home, so taken to ED. VS wnl.  P:  129 BP:  184/107

## 2022-06-20 NOTE — ED Triage Notes (Signed)
Patient denies current complaint, states he is feeling better.

## 2022-06-20 NOTE — ED Provider Notes (Signed)
[facility] Provider Note    Event Date/Time   First MD Initiated Contact with Patient 06/20/22 1357     (approximate)   History   Dehydration   HPI  Lawrence Rowe is a 52 y.o. male who presents today via EMS for possible dehydration.  Patient reports that there is a [address] that he likes to walk, but he has not done it in a while.  He reports that he started it too late in the day and got very hot.  He reports that he subsequently sat down on the curb and called the ambulance to ask for a ride home, but they brought him to the hospital instead.  Patient reports that while he was in the waiting room he had 2 cups of ice water and feels significantly improved.  He does not want to stay for any sort of workup today and is requesting to be discharged immediately.  He denies dizziness, chest pain, shortness of breath, muscle pain, or dark-colored urine.  He reports that he urinated while he was in the waiting room, and reports that it was pale yellow.  He is refusing any workup today.  He reports that his last drink was at 9:30 PM last night.  He reports that he does not feel like he is withdrawing currently.  Patient Active Problem List   Diagnosis Date Noted   Lactic acidosis 06/14/2022   Alcohol withdrawal with inpatient treatment (HCC) 06/08/2022   High anion gap metabolic acidosis 06/08/2022   Chronic diastolic CHF (congestive heart failure) (HCC) 01/13/2022   Overweight (BMI 25.0-29.9) 01/12/2022   Fatty liver 01/12/2022   Abnormal liver function 01/11/2022   Wernicke encephalopathy syndrome 12/27/2021   Delirium tremens (HCC) 12/27/2021   Alcoholic hepatitis 12/27/2021   Alcohol withdrawal (HCC) 12/23/2021   Transaminitis 12/23/2021   Unilateral edema of lower extremity 12/23/2021   Dermatitis 05/10/2021   Primary hypertension 05/10/2021   Tachycardia with hypertension 04/19/2021   PAD (peripheral artery disease) (HCC) 04/19/2021    Alcohol abuse 04/19/2021   Metabolic syndrome 04/19/2021          Physical Exam   Triage Vital Signs: ED Triage Vitals  Enc Vitals Group     BP 06/20/22 1232 (!) 154/107     Pulse Rate 06/20/22 1232 (!) 118     Resp 06/20/22 1232 16     Temp 06/20/22 1232 98.6 F (37 C)     Temp Source 06/20/22 1232 Oral     SpO2 06/20/22 1232 99 %     Weight 06/20/22 1233 217 lb 2.5 oz (98.5 kg)     Height 06/20/22 1233 6\' 1"  (1.854 m)     Head Circumference --      Peak Flow --      Pain Score 06/20/22 1233 0     Pain Loc --      Pain Edu? --      Excl. in GC? --     Most recent vital signs: Vitals:   06/20/22 1232 06/20/22 1420  BP: (!) 154/107 (!) 164/99  Pulse: (!) 118 (!) 104  Resp: 16 20  Temp: 98.6 F (37 C) 98.6 F (37 C)  SpO2: 99% 99%    Physical Exam Vitals and nursing note reviewed.  Constitutional:      General: Awake and alert. No acute distress.    Appearance: Normal appearance. The patient is overweight.  HENT:     Head: Normocephalic and atraumatic.  Mouth: Mucous membranes are dry.  No tongue fasciculations Eyes:     General: PERRL. Normal EOMs        Right eye: No discharge.        Left eye: No discharge.     Conjunctiva/sclera: Conjunctivae normal.  Cardiovascular:     Rate and Rhythm: Tachycardic rate and regular rhythm.     Pulses: Normal pulses.  Pulmonary:     Effort: Pulmonary effort is normal. No respiratory distress.     Breath sounds: Normal breath sounds.  Abdominal:     Abdomen is soft. There is no abdominal tenderness. No rebound or guarding. No distention. Musculoskeletal:        General: No swelling. Normal range of motion.     Cervical back: Normal range of motion and neck supple.  No tremors Skin:    General: Skin is warm and dry.     Capillary Refill: Capillary refill takes less than 2 seconds.     Findings: No rash.  Neurological:     Mental Status: The patient is awake and alert.  Answering questions appropriately     ED Results / Procedures / Treatments   Labs (all labs ordered are listed, but only abnormal results are displayed) Labs Reviewed - No data to display   EKG     RADIOLOGY     PROCEDURES:  Critical Care performed:   Procedures   MEDICATIONS ORDERED IN ED: Medications - No data to display   IMPRESSION / MDM / ASSESSMENT AND PLAN / ED COURSE  I reviewed the triage vital signs and the nursing notes.   Differential diagnosis includes, but is not limited to, dehydration, rhabdomyolysis, electrolyte disarray, alcohol withdrawal.  Patient is awake and alert, tachycardic to 118 on arrival, mildly hypertensive, but afebrile with a normal oxygen saturation on room air.  I reviewed the patient's chart.  Patient has had numerous ER visits and hospitalizations, most recently yesterday at which time he was seen for gastritis in the setting of alcohol use.  Patient was also admitted from 06/08/2022 until 06/16/2022 for alcohol withdrawal.  Given patient's history of alcohol abuse and alcohol withdrawal and concerned about possible alcohol withdrawal, rhabdomyolysis, or dehydration.  I recommended that the patient have IV, blood tests, IV fluids, the possibility of advanced imaging and admission.  However, patient is declining all further workup and requesting to be discharged only.  He reports that he drank ice water in the waiting room and feels completely back to normal and has no complaints today.  He is answering questions appropriately, has no tongue fasciculations or tremulousness on exam.  He reports that he did urinate in the waiting room and it was pale yellow.  However, patient has had numerous visits to the emergency department, and this does not alleviate my concerns.  I emphasized this to the patient given that he is high risk for complications.  He understands, though continues to decline workup and treatment.  Heart rate had improved to 104 without intervention.  Patient does  not currently appear to be intoxicated.  No slurring of his speech, answering questions appropriately, no ataxia.  Furthermore, he declines drinking any alcohol today, reports that his last drink was last night.  The risks of leaving against medical advice without further workup were explained to the patient.  The patient demonstrated good understanding and was able to verbalize in their own words the risks of leaving up to, and including, death or permanent disability.  While I do  not agree with their decision, their thought process is goal directed.  Their speech is appropriate and mental status is normal. The patient has not demonstrated any SI.  The decision appears to be consistent with what limited knowledge I have of the patient's values.  The patient has capacity to make the decision to leave agaist medical advice.  The patient was informed that we are always happy to care for them and that they are always welcome to return at any time.  The patient understands the warning signs and symptoms that should prompt them to seek immediate emergency care.    Patient's presentation is most consistent with acute complicated illness / injury requiring diagnostic workup.    FINAL CLINICAL IMPRESSION(S) / ED DIAGNOSES   Final diagnoses:  Dehydration     Rx / DC Orders   ED Discharge Orders     None        Note:  This document was prepared using Dragon voice recognition software and may include unintentional dictation errors.   Keturah Shavers 06/20/22 1740    Trinna Post, MD 06/20/22 1931

## 2022-06-20 NOTE — ED Notes (Signed)
Pt alert, NAD, calm, interactive. Self transfers from w/c to recliner.

## 2022-11-15 IMAGING — US US EXTREM LOW VENOUS*R*
1 series · 14 of 24 positions shown · non-contrast
Comparison: None.

CLINICAL DATA: Right lower extremity pain and swelling.

EXAM:
RIGHT LOWER EXTREMITY VENOUS DOPPLER ULTRASOUND
TECHNIQUE: Gray-scale sonography with compression, as well as color and duplex
ultrasound, were performed to evaluate the deep venous system(s)
from the level of the common femoral vein through the popliteal and
proximal calf veins.

[Series 1: us venous img lower uni right (dvt) · portal-venous · 14 of 35 slices shown]
[im 1/35]
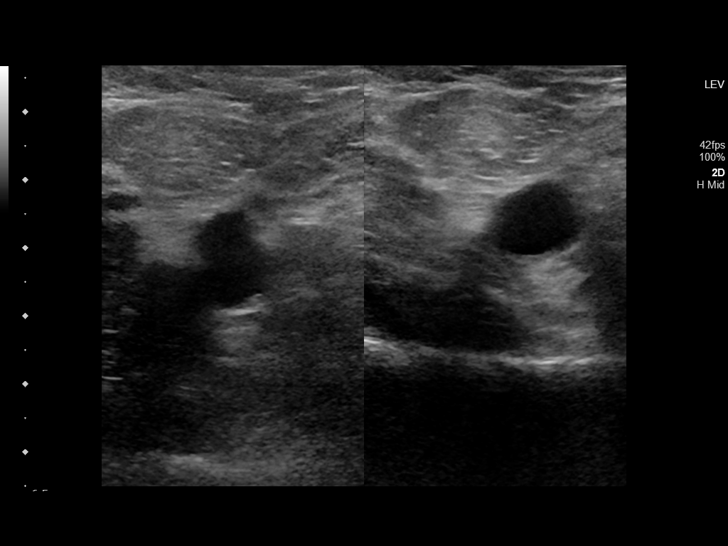
[im 3/35]
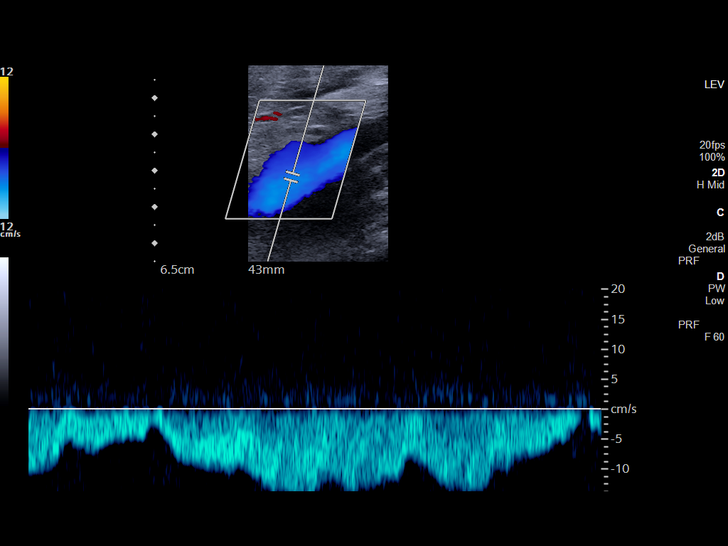
[im 6/35]
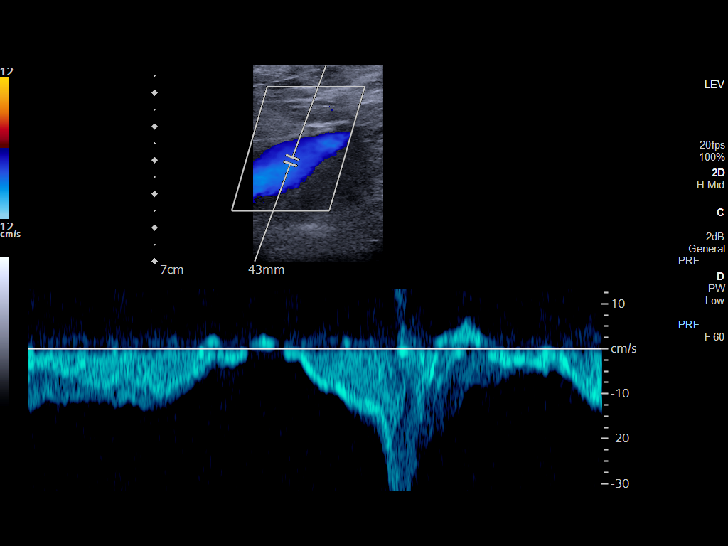
[im 9/35]
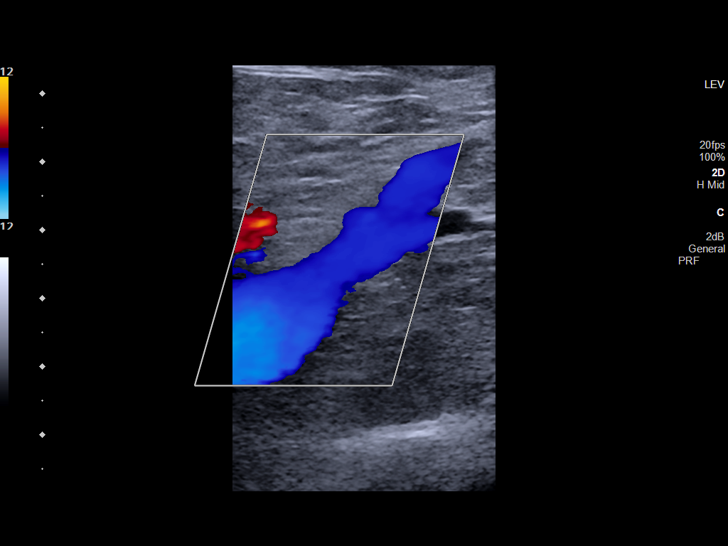
[im 11/35]
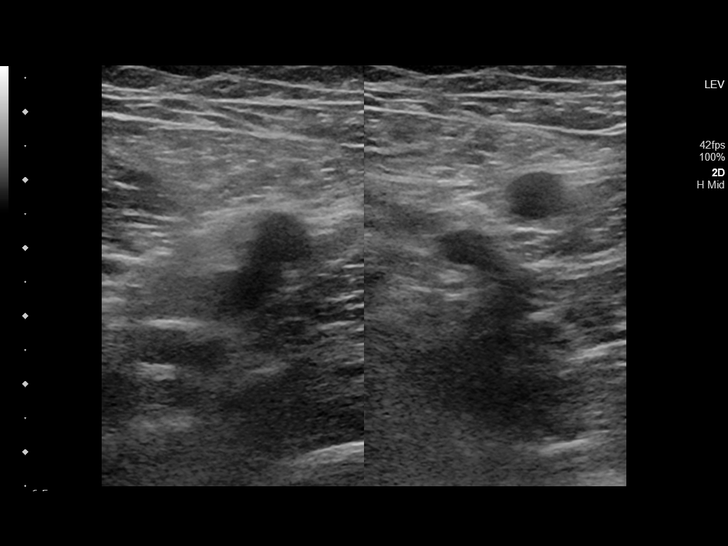
[im 14/35]
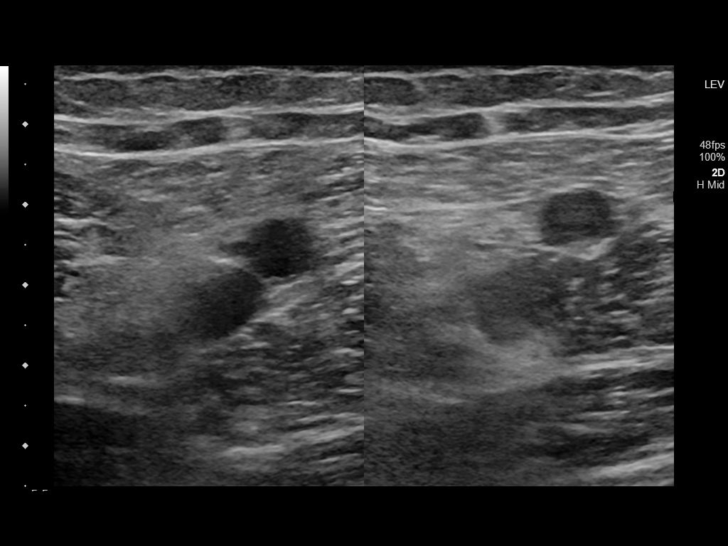
[im 17/35]
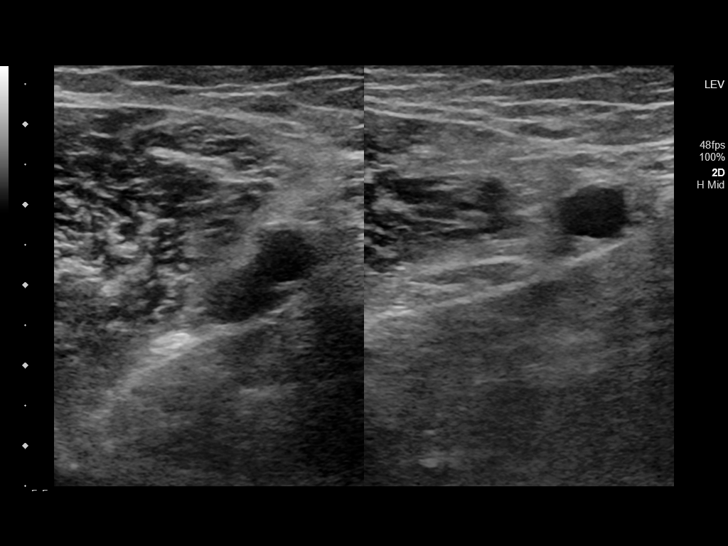
[im 18/35]
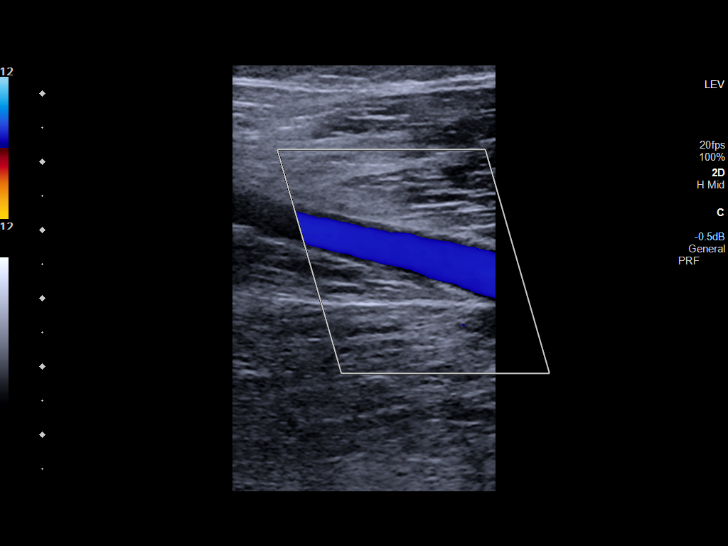
[im 21/35]
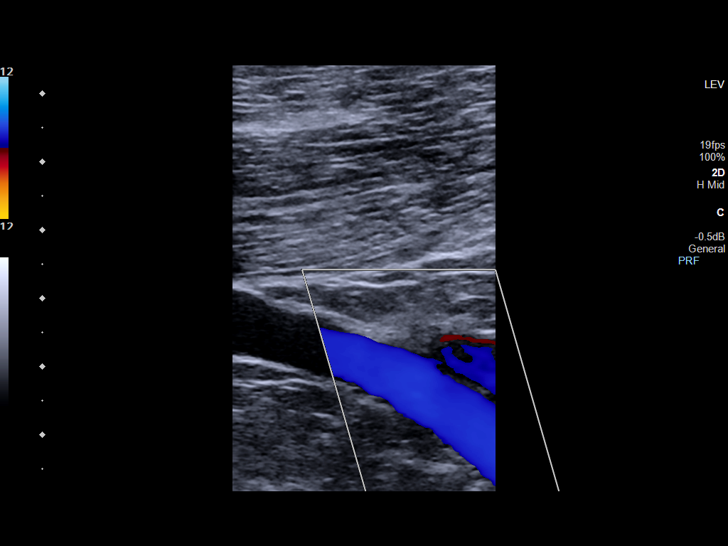
[im 24/35]
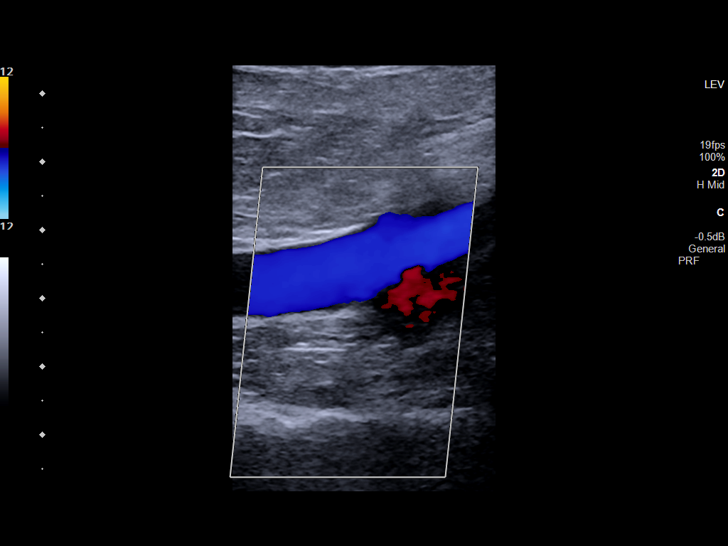
[im 27/35]
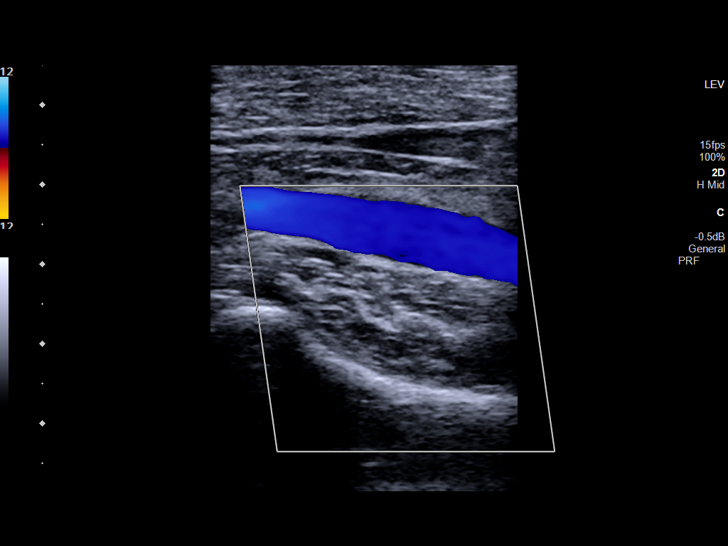
[im 29/35]
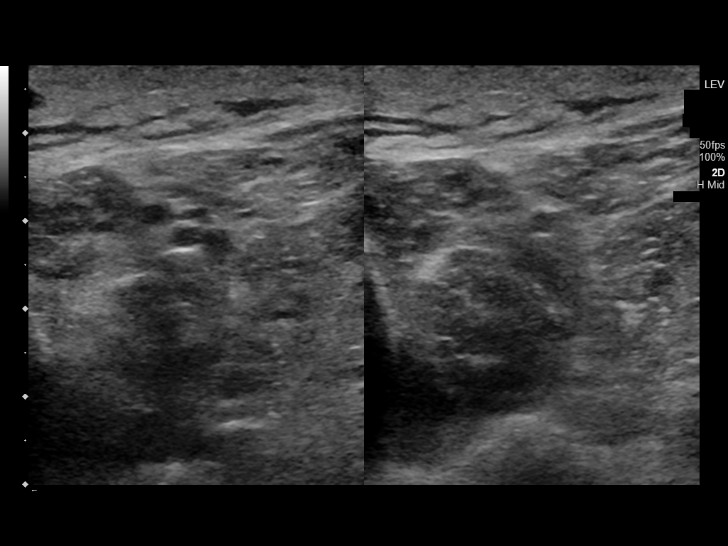
[im 32/35]
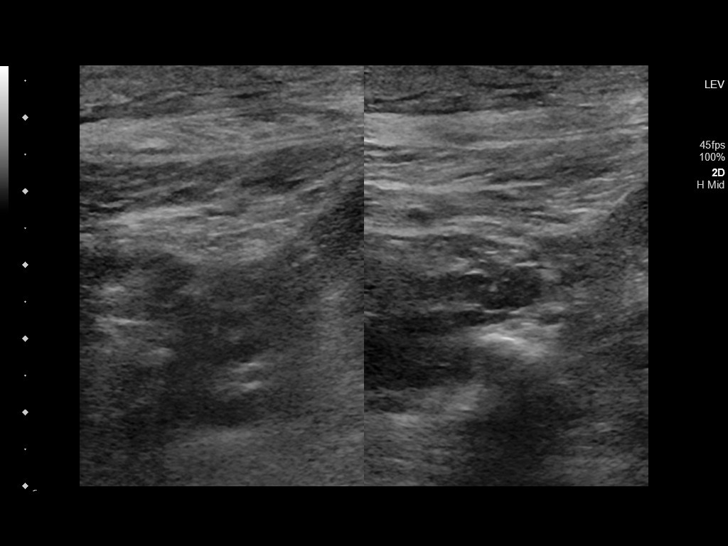
[im 35/35]
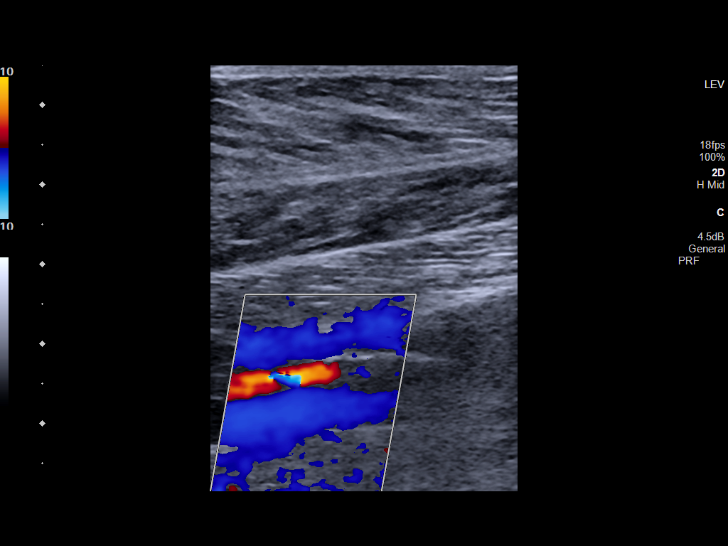

[14 of 24 positions shown; findings below may reference images not displayed]

FINDINGS: VENOUS

Normal compressibility of the common femoral, superficial femoral,
and popliteal veins, as well as the visualized calf veins.
Visualized portions of profunda femoral vein and great saphenous
vein unremarkable. No filling defects to suggest DVT on grayscale or
color Doppler imaging. Doppler waveforms show normal direction of
venous flow, normal respiratory plasticity and response to
augmentation.

Limited views of the contralateral common femoral vein are
unremarkable.

OTHER

None.

Limitations: none
IMPRESSION: Negative.

## 2022-12-22 ENCOUNTER — Emergency Department: Payer: Medicaid Other

## 2022-12-22 ENCOUNTER — Other Ambulatory Visit: Payer: Self-pay

## 2022-12-22 ENCOUNTER — Inpatient Hospital Stay
Admission: EM | Admit: 2022-12-22 | Discharge: 2022-12-27 | DRG: 897 | Disposition: A | Discharge status: Home / Self Care | Payer: Medicaid Other | Attending: Hospitalist | Admitting: Hospitalist

## 2022-12-22 DIAGNOSIS — I959 Hypotension, unspecified: Secondary | ICD-10-CM | POA: Diagnosis not present

## 2022-12-22 DIAGNOSIS — F10931 Alcohol use, unspecified with withdrawal delirium: Secondary | ICD-10-CM | POA: Diagnosis not present

## 2022-12-22 DIAGNOSIS — Z7141 Alcohol abuse counseling and surveillance of alcoholic: Secondary | ICD-10-CM

## 2022-12-22 DIAGNOSIS — Z880 Allergy status to penicillin: Secondary | ICD-10-CM | POA: Diagnosis not present

## 2022-12-22 DIAGNOSIS — R7989 Other specified abnormal findings of blood chemistry: Secondary | ICD-10-CM | POA: Diagnosis not present

## 2022-12-22 DIAGNOSIS — I10 Essential (primary) hypertension: Secondary | ICD-10-CM | POA: Diagnosis present

## 2022-12-22 DIAGNOSIS — F10139 Alcohol abuse with withdrawal, unspecified: Secondary | ICD-10-CM | POA: Diagnosis present

## 2022-12-22 DIAGNOSIS — E876 Hypokalemia: Secondary | ICD-10-CM | POA: Diagnosis not present

## 2022-12-22 DIAGNOSIS — Z79899 Other long term (current) drug therapy: Secondary | ICD-10-CM

## 2022-12-22 DIAGNOSIS — F1021 Alcohol dependence, in remission: Secondary | ICD-10-CM | POA: Diagnosis present

## 2022-12-22 DIAGNOSIS — E512 Wernicke's encephalopathy: Secondary | ICD-10-CM | POA: Diagnosis present

## 2022-12-22 DIAGNOSIS — R262 Difficulty in walking, not elsewhere classified: Secondary | ICD-10-CM | POA: Diagnosis present

## 2022-12-22 DIAGNOSIS — F1091 Alcohol use, unspecified, in remission: Secondary | ICD-10-CM | POA: Diagnosis present

## 2022-12-22 DIAGNOSIS — E65 Localized adiposity: Secondary | ICD-10-CM | POA: Insufficient documentation

## 2022-12-22 DIAGNOSIS — F10939 Alcohol use, unspecified with withdrawal, unspecified: Secondary | ICD-10-CM | POA: Diagnosis present

## 2022-12-22 DIAGNOSIS — K219 Gastro-esophageal reflux disease without esophagitis: Secondary | ICD-10-CM | POA: Insufficient documentation

## 2022-12-22 DIAGNOSIS — Z87891 Personal history of nicotine dependence: Secondary | ICD-10-CM

## 2022-12-22 DIAGNOSIS — F10930 Alcohol use, unspecified with withdrawal, uncomplicated: Secondary | ICD-10-CM | POA: Diagnosis not present

## 2022-12-22 DIAGNOSIS — Z781 Physical restraint status: Secondary | ICD-10-CM | POA: Diagnosis not present

## 2022-12-22 DIAGNOSIS — E669 Obesity, unspecified: Secondary | ICD-10-CM | POA: Diagnosis present

## 2022-12-22 DIAGNOSIS — Z6827 Body mass index (BMI) 27.0-27.9, adult: Secondary | ICD-10-CM

## 2022-12-22 DIAGNOSIS — R17 Unspecified jaundice: Secondary | ICD-10-CM | POA: Diagnosis present

## 2022-12-22 DIAGNOSIS — R569 Unspecified convulsions: Secondary | ICD-10-CM | POA: Diagnosis not present

## 2022-12-22 DIAGNOSIS — F1010 Alcohol abuse, uncomplicated: Secondary | ICD-10-CM | POA: Diagnosis present

## 2022-12-22 DIAGNOSIS — R000 Tachycardia, unspecified: Secondary | ICD-10-CM | POA: Diagnosis present

## 2022-12-22 HISTORY — DX: Gastro-esophageal reflux disease without esophagitis: K21.9

## 2022-12-22 HISTORY — DX: Essential (primary) hypertension: I10

## 2022-12-22 HISTORY — DX: Alcohol dependence with alcohol-induced anxiety disorder: F10.280

## 2022-12-22 LAB — TROPONIN I (HIGH SENSITIVITY)
Troponin I (High Sensitivity): 7 ng/L (ref <18)
Troponin I (High Sensitivity): 7 ng/L (ref <18)

## 2022-12-22 LAB — URINALYSIS, ROUTINE W REFLEX MICROSCOPIC
Bilirubin Urine: NEGATIVE
Glucose, UA: NEGATIVE mg/dL
Hgb urine dipstick: NEGATIVE
Ketones, ur: 5 mg/dL — AB
Leukocytes,Ua: NEGATIVE
Nitrite: NEGATIVE
Protein, ur: NEGATIVE mg/dL
Specific Gravity, Urine: 1.019 (ref 1.005–1.030)
pH: 8 (ref 5.0–8.0)

## 2022-12-22 LAB — CBC WITH DIFFERENTIAL/PLATELET
Abs Immature Granulocytes: 0.02 10*3/uL (ref 0.00–0.07)
Basophils Absolute: 0.1 10*3/uL (ref 0.0–0.1)
Basophils Relative: 1 %
Eosinophils Absolute: 0 10*3/uL (ref 0.0–0.5)
Eosinophils Relative: 0 %
HCT: 34.8 % — ABNORMAL LOW (ref 39.0–52.0)
Hemoglobin: 12.1 g/dL — ABNORMAL LOW (ref 13.0–17.0)
Immature Granulocytes: 0 %
Lymphocytes Relative: 11 %
Lymphs Abs: 0.7 10*3/uL (ref 0.7–4.0)
MCH: 34.7 pg — ABNORMAL HIGH (ref 26.0–34.0)
MCHC: 34.8 g/dL (ref 30.0–36.0)
MCV: 99.7 fL (ref 80.0–100.0)
Monocytes Absolute: 0.7 10*3/uL (ref 0.1–1.0)
Monocytes Relative: 11 %
Neutro Abs: 4.6 10*3/uL (ref 1.7–7.7)
Neutrophils Relative %: 77 %
Platelets: 100 10*3/uL — ABNORMAL LOW (ref 150–400)
RBC: 3.49 MIL/uL — ABNORMAL LOW (ref 4.22–5.81)
RDW: 15.9 % — ABNORMAL HIGH (ref 11.5–15.5)
WBC: 6.1 10*3/uL (ref 4.0–10.5)
nRBC: 0 % (ref 0.0–0.2)

## 2022-12-22 LAB — BRAIN NATRIURETIC PEPTIDE: B Natriuretic Peptide: 70.8 pg/mL (ref 0.0–100.0)

## 2022-12-22 LAB — COMPREHENSIVE METABOLIC PANEL
ALT: 50 U/L — ABNORMAL HIGH (ref 0–44)
AST: 116 U/L — ABNORMAL HIGH (ref 15–41)
Albumin: 3.4 g/dL — ABNORMAL LOW (ref 3.5–5.0)
Alkaline Phosphatase: 70 U/L (ref 38–126)
Anion gap: 11 (ref 5–15)
BUN: 7 mg/dL (ref 6–20)
CO2: 20 mmol/L — ABNORMAL LOW (ref 22–32)
Calcium: 8.5 mg/dL — ABNORMAL LOW (ref 8.9–10.3)
Chloride: 107 mmol/L (ref 98–111)
Creatinine, Ser: 0.57 mg/dL — ABNORMAL LOW (ref 0.61–1.24)
GFR, Estimated: >60 mL/min (ref >60)
Glucose, Bld: 131 mg/dL — ABNORMAL HIGH (ref 70–99)
Potassium: 3.9 mmol/L (ref 3.5–5.1)
Sodium: 138 mmol/L (ref 135–145)
Total Bilirubin: 1.7 mg/dL — ABNORMAL HIGH (ref <1.2)
Total Protein: 7.5 g/dL (ref 6.5–8.1)

## 2022-12-22 LAB — MAGNESIUM: Magnesium: 1.8 mg/dL (ref 1.7–2.4)

## 2022-12-22 LAB — PHOSPHORUS: Phosphorus: 2.9 mg/dL (ref 2.5–4.6)

## 2022-12-22 LAB — CK: Total CK: 78 U/L (ref 49–397)

## 2022-12-22 MED ORDER — ACETAMINOPHEN 325 MG PO TABS: 650.0000 mg | ORAL_TABLET | Freq: Four times a day (QID) | ORAL | Status: DC | PRN

## 2022-12-22 MED ORDER — ACETAMINOPHEN 650 MG RE SUPP: 650.0000 mg | Freq: Four times a day (QID) | RECTAL | Status: DC | PRN

## 2022-12-22 MED ORDER — SENNOSIDES-DOCUSATE SODIUM 8.6-50 MG PO TABS: 1.0000 | ORAL_TABLET | Freq: Every evening | ORAL | Status: DC | PRN

## 2022-12-22 MED ORDER — LORAZEPAM 1 MG PO TABS: 1.0000 mg | ORAL_TABLET | ORAL | Status: DC | PRN

## 2022-12-22 MED ORDER — LACTATED RINGERS IV SOLN: INTRAVENOUS | Status: DC

## 2022-12-22 MED ORDER — CLONIDINE HCL 0.1 MG PO TABS: 0.1000 mg | ORAL_TABLET | Freq: Three times a day (TID) | ORAL | Status: AC | PRN

## 2022-12-22 MED ORDER — METOPROLOL TARTRATE 5 MG/5ML IV SOLN: 5.0000 mg | INTRAVENOUS | Status: DC | PRN

## 2022-12-22 MED ORDER — LOSARTAN POTASSIUM 50 MG PO TABS
50.0000 mg | ORAL_TABLET | Freq: Every day | ORAL | Status: DC
  Administered 2022-12-23 – 2022-12-27 (×5): 50 mg via ORAL
  Filled 2022-12-22 (×5): qty 1

## 2022-12-22 MED ORDER — THIAMINE MONONITRATE 100 MG PO TABS
100.0000 mg | ORAL_TABLET | Freq: Every day | ORAL | Status: DC
  Administered 2022-12-22 – 2022-12-27 (×5): 100 mg via ORAL
  Filled 2022-12-22 (×5): qty 1

## 2022-12-22 MED ORDER — ENOXAPARIN SODIUM 40 MG/0.4ML IJ SOSY
40.0000 mg | PREFILLED_SYRINGE | INTRAMUSCULAR | Status: DC
  Administered 2022-12-22 – 2022-12-26 (×5): 40 mg via SUBCUTANEOUS
  Filled 2022-12-22 (×5): qty 0.4

## 2022-12-22 MED ORDER — ADULT MULTIVITAMIN W/MINERALS CH
1.0000 | ORAL_TABLET | Freq: Every day | ORAL | Status: DC
  Administered 2022-12-22 – 2022-12-27 (×6): 1 via ORAL
  Filled 2022-12-22 (×6): qty 1

## 2022-12-22 MED ORDER — FOLIC ACID 1 MG PO TABS
1.0000 mg | ORAL_TABLET | Freq: Every day | ORAL | Status: DC
  Administered 2022-12-22 – 2022-12-27 (×6): 1 mg via ORAL
  Filled 2022-12-22 (×6): qty 1

## 2022-12-22 MED ORDER — LORAZEPAM 2 MG/ML IJ SOLN
1.0000 mg | INTRAMUSCULAR | Status: AC | PRN
  Administered 2022-12-22: 1 mg via INTRAVENOUS
  Administered 2022-12-22: 2 mg via INTRAVENOUS
  Administered 2022-12-23: 1 mg via INTRAVENOUS
  Administered 2022-12-23: 3 mg via INTRAVENOUS
  Administered 2022-12-23: 1 mg via INTRAVENOUS
  Administered 2022-12-23: 4 mg via INTRAVENOUS
  Administered 2022-12-23 – 2022-12-24 (×3): 2 mg via INTRAVENOUS
  Administered 2022-12-24: 4 mg via INTRAVENOUS
  Administered 2022-12-24: 2 mg via INTRAVENOUS
  Administered 2022-12-24: 4 mg via INTRAVENOUS
  Administered 2022-12-24: 2 mg via INTRAVENOUS
  Administered 2022-12-24: 4 mg via INTRAVENOUS
  Administered 2022-12-24: 2 mg via INTRAVENOUS
  Filled 2022-12-22 (×2): qty 1
  Filled 2022-12-22: qty 2
  Filled 2022-12-22: qty 1
  Filled 2022-12-22 (×2): qty 2
  Filled 2022-12-22 (×3): qty 1
  Filled 2022-12-22: qty 2
  Filled 2022-12-22: qty 1
  Filled 2022-12-22: qty 2
  Filled 2022-12-22 (×2): qty 1
  Filled 2022-12-22: qty 2
  Filled 2022-12-22: qty 1

## 2022-12-22 MED ORDER — ONDANSETRON HCL 4 MG/2ML IJ SOLN: 4.0000 mg | Freq: Four times a day (QID) | INTRAMUSCULAR | Status: DC | PRN

## 2022-12-22 MED ORDER — METOPROLOL SUCCINATE ER 50 MG PO TB24
50.0000 mg | ORAL_TABLET | Freq: Every day | ORAL | Status: DC
  Administered 2022-12-23 – 2022-12-27 (×5): 50 mg via ORAL
  Filled 2022-12-22 (×5): qty 1

## 2022-12-22 MED ORDER — AMLODIPINE BESYLATE 10 MG PO TABS
10.0000 mg | ORAL_TABLET | Freq: Every day | ORAL | Status: DC
  Administered 2022-12-23 – 2022-12-26 (×4): 10 mg via ORAL
  Filled 2022-12-22: qty 1
  Filled 2022-12-22: qty 2
  Filled 2022-12-22 (×2): qty 1

## 2022-12-22 MED ORDER — THIAMINE HCL 100 MG/ML IJ SOLN
100.0000 mg | Freq: Every day | INTRAMUSCULAR | Status: DC
  Administered 2022-12-24: 100 mg via INTRAVENOUS
  Filled 2022-12-22: qty 2

## 2022-12-22 MED ORDER — ONDANSETRON HCL 4 MG PO TABS: 4.0000 mg | ORAL_TABLET | Freq: Four times a day (QID) | ORAL | Status: DC | PRN

## 2022-12-22 MED ORDER — PANTOPRAZOLE SODIUM 40 MG IV SOLR
40.0000 mg | Freq: Every day | INTRAVENOUS | Status: AC
  Administered 2022-12-22 – 2022-12-23 (×2): 40 mg via INTRAVENOUS
  Filled 2022-12-22 (×2): qty 10

## 2022-12-22 NOTE — Assessment & Plan Note (Addendum)
History of At bedside on admission, patient is not exhibiting signs of confabulation Thiamine 100 mg p.o./IV daily initiated by EDP, will continue this on admission

## 2022-12-22 NOTE — Assessment & Plan Note (Addendum)
Protonix 40 mg daily nightly resumed

## 2022-12-22 NOTE — ED Provider Notes (Signed)
[facility] Provider Note    Event Date/Time   First MD Initiated Contact with Patient 12/22/22 1625     (approximate)   History   Shortness of Breath and detox   HPI Lawrence Rowe is a 52 y.o. male with history of alcohol abuse, HTN, delirium tremens presenting today for alcohol withdrawal.  Patient states last drink of alcohol was 10 PM last night.  Normally drinks 10 beers per day.  Today he has been diaphoretic, tremulous, and intermittent shortness of breath which he feels is related to his normal detox symptoms.  This afternoon he was so profoundly weak that he was unable to stand on his own anymore.  Friend at the bedside states that this happened last time when he required admission to the ICU.  Otherwise denies chest pain, abdominal pain, nausea, vomiting, leg pain, leg swelling.  Chart review: Patient has had 3 different admissions to the hospital within the last 3 years for alcohol withdrawal symptoms which have been complicated by delirium tremens.     Physical Exam   Triage Vital Signs: ED Triage Vitals [12/22/22 1310]  Encounter Vitals Group     BP (!) 164/101     Systolic BP Percentile      Diastolic BP Percentile      Pulse Rate (!) 113     Resp 19     Temp 98 F (36.7 C)     Temp src      SpO2 100 %     Weight 200 lb (90.7 kg)     Height 6' (1.829 m)     Head Circumference      Peak Flow      Pain Score 10     Pain Loc      Pain Education      Exclude from Growth Chart     Most recent vital signs: Vitals:   12/22/22 1310 12/22/22 1512  BP: (!) 164/101 (!) 161/94  Pulse: (!) 113 (!) 111  Resp: 19 16  Temp: 98 F (36.7 C) 98.7 F (37.1 C)  SpO2: 100% 95%   I have reviewed the vital signs. General:  Awake, alert, tremulous and mildly diaphoretic Head:  Normocephalic, Atraumatic. EENT:  PERRL, EOMI, Oral mucosa pink and moist, Neck is supple. Cardiovascular: Tachycardic, 2+ distal pulses. Respiratory:  Normal  respiratory effort, symmetrical expansion, no distress.   Abdomen: Soft, nontender, nondistended Extremities:  Moving all four extremities through full ROM without pain.   Neuro:  Alert and oriented.  Interacting appropriately.  Tremulous.  CIWA of 12 Skin:  Warm, dry, no rash.   Psych: Appropriate affect.    ED Results / Procedures / Treatments   Labs (all labs ordered are listed, but only abnormal results are displayed) Labs Reviewed  COMPREHENSIVE METABOLIC PANEL - Abnormal; Notable for the following components:      Result Value   CO2 20 (*)    Glucose, Bld 131 (*)    Creatinine, Ser 0.57 (*)    Calcium 8.5 (*)    Albumin 3.4 (*)    AST 116 (*)    ALT 50 (*)    Total Bilirubin 1.7 (*)    All other components within normal limits  CBC WITH DIFFERENTIAL/PLATELET - Abnormal; Notable for the following components:   RBC 3.49 (*)    Hemoglobin 12.1 (*)    HCT 34.8 (*)    MCH 34.7 (*)    RDW 15.9 (*)  Platelets 100 (*)    All other components within normal limits  CK  BRAIN NATRIURETIC PEPTIDE  URINALYSIS, ROUTINE W REFLEX MICROSCOPIC  MAGNESIUM  PHOSPHORUS  HIV ANTIBODY (ROUTINE TESTING W REFLEX)  COMPREHENSIVE METABOLIC PANEL  CBC  TROPONIN I (HIGH SENSITIVITY)  TROPONIN I (HIGH SENSITIVITY)     EKG My EKG interpretation: Rate of 115, sinus tachycardia.  Normal axis, normal intervals.  No acute ST elevations or depressions   RADIOLOGY Independently interpreted chest x-ray with no acute pathology   PROCEDURES:  Critical Care performed: No  Procedures   MEDICATIONS ORDERED IN ED: Medications  LORazepam (ATIVAN) injection 1-4 mg (has no administration in time range)  thiamine (VITAMIN B1) tablet 100 mg (has no administration in time range)    Or  thiamine (VITAMIN B1) injection 100 mg (has no administration in time range)  folic acid (FOLVITE) tablet 1 mg (has no administration in time range)  multivitamin with minerals tablet 1 tablet (has no  administration in time range)  acetaminophen (TYLENOL) tablet 650 mg (has no administration in time range)    Or  acetaminophen (TYLENOL) suppository 650 mg (has no administration in time range)  ondansetron (ZOFRAN) tablet 4 mg (has no administration in time range)    Or  ondansetron (ZOFRAN) injection 4 mg (has no administration in time range)  enoxaparin (LOVENOX) injection 40 mg (has no administration in time range)  senna-docusate (Senokot-S) tablet 1 tablet (has no administration in time range)  lactated ringers infusion (has no administration in time range)     IMPRESSION / MDM / ASSESSMENT AND PLAN / ED COURSE  I reviewed the triage vital signs and the nursing notes.                              Differential diagnosis includes, but is not limited to, alcohol withdrawal, history of delirium tremens  Patient's presentation is most consistent with severe exacerbation of chronic illness.  Patient is a 52 year old male presenting today for alcohol withdrawals.  CIWA greater than 10 on arrival with notable tremulousness and tachycardia.  Requiring 2 people to stand and pivot with severe weakness which is similar to prior presentations with alcohol detox in the past.  Given severe withdrawal type symptoms with prior history of delirium tremens, will admit to hospitalist for further care.  Patient was given Ativan, folate, multivitamin, and thiamine in the emergency department.  Hospitalist agreed to admit patient for further care.  The patient is on the cardiac monitor to evaluate for evidence of arrhythmia and/or significant heart rate changes.     FINAL CLINICAL IMPRESSION(S) / ED DIAGNOSES   Final diagnoses:  Alcohol withdrawal syndrome with complication (HCC)     Rx / DC Orders   ED Discharge Orders     None        Note:  This document was prepared using Dragon voice recognition software and may include unintentional dictation errors.   Janith Lima, MD 12/22/22  1726

## 2022-12-22 NOTE — Hospital Course (Addendum)
Mr. Karandeep Haskill is a 52 year old male with history of hypertension, GERD, alcohol abuse, presents emergency department for chief concerns of alcohol withdrawal.  Patient reports that he has been trying to quit cold [place], specifically he quit drinking at 10 PM yesterday on 12/21/22.  Patient was being driven from home directly to alcohol rehab facility however he could not make it due to severe tremors and cramping in his feet.  Vitals in the ED showed temperature 98.7, respiration rate 16, heart rate of 111, blood pressure 161/94, SpO2 of 95% on room air.  Serum sodium is 138, potassium 2.9, chloride 107, bicarb 20, BUN 7, serum creatinine of 0.57, EGFR greater than 60, nonfasting blood glucose 131, WBC 6.1, hemoglobin 12.1, platelets 100.  T. bili was elevated at 1.7.  AST was elevated 116.  ALT is 50.  BNP is 70.8.  CK was 78.  High since troponin was 7 x 2.  ED treatment: Patient was initiated on CIWA precaution.

## 2022-12-22 NOTE — Assessment & Plan Note (Addendum)
Patient takes amlodipine 10 mg daily, losartan 50 mg daily, metoprolol succinate 50 mg daily, these were resumed on admission Clonidine 0.1 mg p.o. 3 times daily as needed for SBP greater than 170, 1 day ordered

## 2022-12-22 NOTE — Assessment & Plan Note (Addendum)
Secondary to alcohol abuse, check CMP in the a.m.

## 2022-12-22 NOTE — ED Triage Notes (Addendum)
Pt comes with c/o increased weakness, sob and not walking. Pt states cramps in his feet. Pt states he normally drinks 12 pack beer today. Pt states last drink was 10:30pm. Pt states he has been drinking for several years.   Pt has obvious tremors present, pt denies any N/V or belly pain.

## 2022-12-22 NOTE — Assessment & Plan Note (Addendum)
Secondary to chronic alcohol use Treat with fluid resuscitation, recheck CMP in the a.m.

## 2022-12-22 NOTE — ED Provider Triage Note (Signed)
Emergency Medicine Provider Triage Evaluation Note  Lawrence Rowe , a 52 y.o. male  was evaluated in triage.  Pt complains of SOB and trouble walking due to cramps in his feet x months and ETOH withdrawal. Reports last drink was 1030pm. Normally drinks 12 pack daily x20 years. No N/V, no dizziness. No fever. No chest pain. No other drug use. Reports normally starts drinking around 3-4pm  Review of Systems  Positive: SOB Negative: CP  Physical Exam  There were no vitals taken for this visit. Gen:   Awake, no distress   Resp:  Normal effort  MSK:   Moves extremities without difficulty  Other:  +tongue fasciculations   Medical Decision Making  Medically screening exam initiated at 1:07 PM.  Appropriate orders placed.  [name] was informed that the remainder of the evaluation will be completed by another provider, this initial triage assessment does not replace that evaluation, and the importance of remaining in the ED until their evaluation is complete.     Jackelyn Hoehn, PA-C 12/22/22 1312

## 2022-12-22 NOTE — Assessment & Plan Note (Addendum)
Home metoprolol succinate 50 mg daily resumed Metoprolol tartrate 5 mg IV every 4 hours as needed for heart rate greater than 120, 3 doses ordered

## 2022-12-22 NOTE — Assessment & Plan Note (Signed)
LR infusion 125 mL/h, 1 day ordered on admission Magnesium and phosphorus changed to urgent collection on admission Fall precaution, aspiration, seizure precaution Continue CIWA precaution Admit to PCU, inpatient

## 2022-12-22 NOTE — Assessment & Plan Note (Signed)
-   This complicates overall care and prognosis.  

## 2022-12-22 NOTE — H&P (Addendum)
History and Physical   Lawrence Rowe QMV:[id number] DOB: May 16, 1970 DOA: 12/22/2022  PCP: Pcp, No  Patient coming from: Home via POV  I have personally briefly reviewed patient's old medical records in [facility] EMR.  Chief Concern: Shortness of breath, tremors, difficulty walking  HPI: [name] is a 52 year old male with history of hypertension, GERD, alcohol abuse, presents emergency department for chief concerns of alcohol withdrawal.  Patient reports that he has been trying to quit cold [place], specifically he quit drinking at 10 PM yesterday on 12/21/22.  Patient was being driven from home directly to alcohol rehab facility however he could not make it due to severe tremors and cramping in his feet.  Vitals in the ED showed temperature 98.7, respiration rate 16, heart rate of 111, blood pressure 161/94, SpO2 of 95% on room air.  Serum sodium is 138, potassium 2.9, chloride 107, bicarb 20, BUN 7, serum creatinine of 0.57, EGFR greater than 60, nonfasting blood glucose 131, WBC 6.1, hemoglobin 12.1, platelets 100.  T. bili was elevated at 1.7.  AST was elevated 116.  ALT is 50.  BNP is 70.8.  CK was 78.  High since troponin was 7 x 2.  ED treatment: Patient was initiated on CIWA precaution. ----------------------------- At bedside, patient is able to tell me his full name, age, location, current calendar year.  He reports that he starts drinking beer at around 2 to 3 PM until 9 PM at night.  He reports that his last beer this time was 12/21/2022 at approximately 10:30 PM as he was trying to quit drinking now.  He has not had any beer today.  His legal wife (separated not yet divorced) was driving him to the rehab facility when he developed cramping and tremors in the car.  He denies trauma to his person, chest pain, shortness of breath, dysuria, hematuria, diarrhea, blood in his stool.  He denies syncope, double vision, changes to his vision, nausea,  vomiting.  He endorses a poor diet and poor p.o. intake in general.  He reports that he frequently orders from [employer].  Social history: He currently lives alone. He denies tobacco and recreational drug use.  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  He drinks about 12 cans (of 12 ounces each) of beers per day.  ROS: Constitutional: no weight change, no fever ENT/Mouth: no sore throat, no rhinorrhea Eyes: no eye pain, no vision changes Cardiovascular: no chest pain, no dyspnea,  no edema, no palpitations Respiratory: no cough, no sputum, no wheezing Gastrointestinal: no nausea, no vomiting, no diarrhea, no constipation Genitourinary: no urinary incontinence, no dysuria, no hematuria Musculoskeletal: no arthralgias, no myalgias Skin: no skin lesions, no pruritus, Neuro: + weakness, no loss of consciousness, no syncope Psych: no anxiety, no depression, + decrease appetite Heme/Lymph: no bruising, no bleeding  ED Course: Discussed with EDP, patient requiring hospitalization for chief concerns of alcohol withdrawal  Assessment/Plan  Principal Problem:   Alcohol withdrawal with inpatient treatment [facility]) Active Problems:   Alcohol abuse   Primary hypertension   Tachycardia with hypertension   Wernicke encephalopathy syndrome   Elevated LFTs   Serum total bilirubin elevated   GERD (gastroesophageal reflux disease)   Truncal obesity   Assessment and Plan:  * Alcohol withdrawal with inpatient treatment (HCC) LR infusion 125 mL/h, 1 day ordered on admission Magnesium and phosphorus changed to urgent collection on admission Fall precaution, aspiration, seizure precaution Continue CIWA precaution Admit to PCU,  inpatient  Primary hypertension Patient takes amlodipine 10 mg daily, losartan 50 mg daily, metoprolol succinate 50 mg daily, these were resumed on admission Clonidine 0.1 mg p.o. 3 times daily as needed for SBP greater than 170, 1 day ordered  Truncal  obesity This complicates overall care and prognosis.   GERD (gastroesophageal reflux disease) Protonix 40 mg daily nightly resumed  Serum total bilirubin elevated Secondary to chronic alcohol use Treat with fluid resuscitation, recheck CMP in the a.m.  Elevated LFTs Secondary to alcohol abuse, check CMP in the a.m.  Wernicke encephalopathy syndrome History of At bedside on admission, patient is not exhibiting signs of confabulation Thiamine 100 mg p.o./IV daily initiated by EDP, will continue this on admission  Tachycardia with hypertension Home metoprolol succinate 50 mg daily resumed Metoprolol tartrate 5 mg IV every 4 hours as needed for heart rate greater than 120, 3 doses ordered  Chart reviewed.   DVT prophylaxis: Enoxaparin Code Status: Full code Diet: Heart healthy diet Family Communication: Updated spouse at bedside with patient's permission Disposition Plan: Pending clinical course, guarded prognosis Consults called: None at this time Admission status: PCU, inpatient  Past Medical History:  Diagnosis Date   Alcohol abuse    Hypertension    Past Surgical History:  Procedure Laterality Date   ELBOW SURGERY Left    Social History:  reports that he quit smoking about 4 years ago. His smoking use included cigarettes. He has never used smokeless tobacco. He reports current alcohol use of about 12.0 standard drinks of alcohol per week. He reports that he does not use drugs.  Allergies  Allergen Reactions   Penicillins Other (See Comments)   Family History  Problem Relation Age of Onset   Alcohol abuse Neg Hx    Family history: Family history reviewed and not pertinent.  Prior to Admission medications   Medication Sig Start Date End Date Taking? Authorizing Provider  amLODipine (NORVASC) 10 MG tablet Take 1 tablet (10 mg total) by mouth daily. 06/17/22   Wouk, Wilfred Curtis, MD  folic acid (FOLVITE) 1 MG tablet Take 1 tablet (1 mg total) by mouth daily. 06/16/22    Wouk, Wilfred Curtis, MD  losartan (COZAAR) 50 MG tablet Take 1 tablet (50 mg total) by mouth daily. 06/17/22   Wouk, Wilfred Curtis, MD  metoprolol succinate (TOPROL-XL) 50 MG 24 hr tablet Take 1 tablet (50 mg total) by mouth daily. Take with or immediately following a meal. 06/16/22   Wouk, Wilfred Curtis, MD  omeprazole (PRILOSEC) 10 MG capsule Take 1 capsule (10 mg total) by mouth daily. 06/19/22   Cuthriell, Delorise Royals, PA-C  thiamine (VITAMIN B1) 100 MG tablet Take 1 tablet (100 mg total) by mouth daily. 06/16/22   Kathrynn Running, MD   Physical Exam: Vitals:   12/22/22 1800 12/22/22 1830 12/22/22 1858 12/22/22 1900  BP: (!) 167/99 (!) 172/94 (!) 172/94 (!) 176/98  Pulse: (!) 105 (!) 108 (!) 110 (!) 111  Resp: 17 19  19   Temp:      SpO2: 96% 97%  96%  Weight:      Height:       Constitutional: appears unhealthy, older than chronological age, calm Eyes: PERRL, lids and conjunctivae normal ENMT: Mucous membranes are dry. Posterior pharynx clear of any exudate or lesions. Age-appropriate dentition. Hearing appropriate Neck: normal, supple, no masses, no thyromegaly Respiratory: clear to auscultation bilaterally, no wheezing, no crackles. Normal respiratory effort. No accessory muscle use.  Cardiovascular: Regular rate and rhythm,  no murmurs / rubs / gallops. No extremity edema. 2+ pedal pulses. No carotid bruits.  Abdomen: Obese abdomen, no tenderness, no masses palpated, no hepatosplenomegaly. Bowel sounds positive.  Musculoskeletal: no clubbing / cyanosis. No joint deformity upper and lower extremities. Good ROM, no contractures, no atrophy. Normal muscle tone.  Skin: no rashes, lesions, ulcers. No induration Neurologic: Sensation intact. Strength 5/5 in all 4.  Psychiatric: Normal judgment and insight. Alert and oriented x 3.  Depressed mood.  Flat affect  EKG: independently reviewed, showing sinus tachycardia with rate of 115, QTc 459  Chest x-ray on Admission: I personally reviewed and I  agree with radiologist reading as below.  DG Chest 2 View Result Date: 12/22/2022 CLINICAL DATA:  Shortness of breath. EXAM: CHEST - 2 VIEW COMPARISON:  Jun 08, 2022. FINDINGS: The heart size and mediastinal contours are within normal limits. Both lungs are clear. The visualized skeletal structures are unremarkable. IMPRESSION: No active cardiopulmonary disease. Electronically Signed   By: Lupita Raider M.D.   On: 12/22/2022 15:18   Labs on Admission: I have personally reviewed following labs  CBC: Recent Labs  Lab 12/22/22 1312  WBC 6.1  NEUTROABS 4.6  HGB 12.1*  HCT 34.8*  MCV 99.7  PLT 100*   Basic Metabolic Panel: Recent Labs  Lab 12/22/22 1312 12/22/22 1723  NA 138  --   K 3.9  --   CL 107  --   CO2 20*  --   GLUCOSE 131*  --   BUN 7  --   CREATININE 0.57*  --   CALCIUM 8.5*  --   MG  --  1.8  PHOS  --  2.9   GFR: Estimated Creatinine Clearance: 115.9 mL/min (A) (by C-G formula based on SCr of 0.57 mg/dL (L)).  Liver Function Tests: Recent Labs  Lab 12/22/22 1312  AST 116*  ALT 50*  ALKPHOS 70  BILITOT 1.7*  PROT 7.5  ALBUMIN 3.4*   Cardiac Enzymes: Recent Labs  Lab 12/22/22 1312  CKTOTAL 78   Urine analysis:    Component Value Date/Time   COLORURINE YELLOW (A) 12/22/2022 1722   APPEARANCEUR CLEAR (A) 12/22/2022 1722   LABSPEC 1.019 12/22/2022 1722   PHURINE 8.0 12/22/2022 1722   GLUCOSEU NEGATIVE 12/22/2022 1722   HGBUR NEGATIVE 12/22/2022 1722   BILIRUBINUR NEGATIVE 12/22/2022 1722   KETONESUR 5 (A) 12/22/2022 1722   PROTEINUR NEGATIVE 12/22/2022 1722   NITRITE NEGATIVE 12/22/2022 1722   LEUKOCYTESUR NEGATIVE 12/22/2022 1722   This document was prepared using Dragon Voice Recognition software and may include unintentional dictation errors.  Dr. Sedalia Muta [facility]  If 7PM-7AM, please contact overnight-coverage provider If 7AM-7PM, please contact day attending provider [email or web address]  12/22/2022, 7:24 PM

## 2022-12-23 DIAGNOSIS — R17 Unspecified jaundice: Secondary | ICD-10-CM

## 2022-12-23 DIAGNOSIS — I10 Essential (primary) hypertension: Secondary | ICD-10-CM

## 2022-12-23 DIAGNOSIS — F10930 Alcohol use, unspecified with withdrawal, uncomplicated: Secondary | ICD-10-CM

## 2022-12-23 DIAGNOSIS — R7989 Other specified abnormal findings of blood chemistry: Secondary | ICD-10-CM | POA: Diagnosis not present

## 2022-12-23 LAB — COMPREHENSIVE METABOLIC PANEL
ALT: 38 U/L (ref 0–44)
AST: 84 U/L — ABNORMAL HIGH (ref 15–41)
Albumin: 2.9 g/dL — ABNORMAL LOW (ref 3.5–5.0)
Alkaline Phosphatase: 57 U/L (ref 38–126)
Anion gap: 7 (ref 5–15)
BUN: 10 mg/dL (ref 6–20)
CO2: 22 mmol/L (ref 22–32)
Calcium: 7.9 mg/dL — ABNORMAL LOW (ref 8.9–10.3)
Chloride: 104 mmol/L (ref 98–111)
Creatinine, Ser: 0.55 mg/dL — ABNORMAL LOW (ref 0.61–1.24)
GFR, Estimated: >60 mL/min (ref >60)
Glucose, Bld: 103 mg/dL — ABNORMAL HIGH (ref 70–99)
Potassium: 3.4 mmol/L — ABNORMAL LOW (ref 3.5–5.1)
Sodium: 133 mmol/L — ABNORMAL LOW (ref 135–145)
Total Bilirubin: 2.6 mg/dL — ABNORMAL HIGH (ref <1.2)
Total Protein: 6.7 g/dL (ref 6.5–8.1)

## 2022-12-23 LAB — CBC
HCT: 30.6 % — ABNORMAL LOW (ref 39.0–52.0)
Hemoglobin: 10.8 g/dL — ABNORMAL LOW (ref 13.0–17.0)
MCH: 35.2 pg — ABNORMAL HIGH (ref 26.0–34.0)
MCHC: 35.3 g/dL (ref 30.0–36.0)
MCV: 99.7 fL (ref 80.0–100.0)
Platelets: 90 10*3/uL — ABNORMAL LOW (ref 150–400)
RBC: 3.07 MIL/uL — ABNORMAL LOW (ref 4.22–5.81)
RDW: 16.1 % — ABNORMAL HIGH (ref 11.5–15.5)
WBC: 5 10*3/uL (ref 4.0–10.5)
nRBC: 0 % (ref 0.0–0.2)

## 2022-12-23 MED ORDER — ORAL CARE MOUTH RINSE: 15.0000 mL | OROMUCOSAL | Status: DC | PRN

## 2022-12-23 MED ORDER — PANTOPRAZOLE SODIUM 40 MG IV SOLR
40.0000 mg | Freq: Every day | INTRAVENOUS | Status: DC
  Filled 2022-12-23: qty 10

## 2022-12-23 MED ORDER — TRAZODONE HCL 100 MG PO TABS
100.0000 mg | ORAL_TABLET | Freq: Every evening | ORAL | Status: DC | PRN
  Administered 2022-12-23: 100 mg via ORAL
  Filled 2022-12-23: qty 1

## 2022-12-23 MED ORDER — CLONIDINE HCL 0.1 MG PO TABS: 0.1000 mg | ORAL_TABLET | Freq: Three times a day (TID) | ORAL | Status: AC | PRN

## 2022-12-23 NOTE — Progress Notes (Signed)
Progress Note   Patient: Lawrence Rowe WJX:[id number] DOB: 03-Dec-1970 DOA: 12/22/2022     1 DOS: the patient was seen and examined on 12/23/2022   Brief hospital course: Lawrence Rowe is a 52 year old male with history of hypertension, GERD, alcohol abuse, presents emergency department for chief concerns of alcohol withdrawal.   Assessment and Plan: * Alcohol withdrawal - moderate to severe Continue Ativan as per CIWA protocol. Continue to monitor vitals, saturations closely. Continue gentle IV fluids LR at 100 mL/h. Continue thiamine, folate, multivitamin. Fall precaution, aspiration, seizure precaution  Primary hypertension BP elevated. Continue amlodipine 10 mg daily, losartan 50 mg daily, metoprolol succinate 50 mg daily. Clonidine 0.1 mg p.o. 3 times daily as needed for SBP greater than 170.  GERD (gastroesophageal reflux disease) Chronic Protonix 40 mg daily nightly.  Alcohol abuse Elevated LFTs Trend LFT. Advised to abstain from alcohol. Continue thiamine, multivitamin. Substance abuse consult.  Any other questions   Out of bed to chair. Incentive spirometry. Nursing supportive care. TOC consult for social issues. He lives alone and per ex girlfriend, his home is not safe place is not clean. DVT prophylaxis Lovenox.   Code Status: Full Code  Subjective: Patient is seen and examined today morning. He is restless, trying to wear hospital gown. Eating fair. Has hand tremors. BP, HR high.  Physical Exam: Vitals:   12/22/22 2200 12/23/22 0400 12/23/22 0700 12/23/22 0914  BP: (!) 156/88  (!) 154/97 (!) 159/96  Pulse: 92  88 100  Resp: 18  16 17   Temp:  98.1 F (36.7 C) 97.9 F (36.6 C) 98.1 F (36.7 C)  TempSrc:  Oral Oral Oral  SpO2: 99%  98% 98%  Weight:      Height:        General -  Middle aged Caucasian male, restless, in distress HEENT - PERRLA, EOMI, atraumatic head, non tender sinuses. Lung - Clear, diffuse rhonchi, no wheezes. Heart  - S1, S2 heard, no murmurs, rubs, trace pedal edema. Abdomen - Soft, non tender, obese, bowel sounds good Neuro - Alert, awake and oriented x 3, non focal exam. Skin - Warm and dry. Hand tremors noted  Data Reviewed:      Latest Ref Rng & Units 12/23/2022    2:26 AM 12/22/2022    1:12 PM 06/19/2022    1:08 PM  CBC  WBC 4.0 - 10.5 K/uL 5.0  6.1  8.1   Hemoglobin 13.0 - 17.0 g/dL 21.3  08.6  57.8   Hematocrit 39.0 - 52.0 % 30.6  34.8  40.6   Platelets 150 - 400 K/uL 90  100  199       Latest Ref Rng & Units 12/23/2022    2:26 AM 12/22/2022    1:12 PM 06/19/2022    1:08 PM  BMP  Glucose 70 - 99 mg/dL 469  629  528   BUN 6 - 20 mg/dL 10  7  6    Creatinine 0.61 - 1.24 mg/dL 4.13  2.44  0.10   Sodium 135 - 145 mmol/L 133  138  137   Potassium 3.5 - 5.1 mmol/L 3.4  3.9  3.7   Chloride 98 - 111 mmol/L 104  107  105   CO2 22 - 32 mmol/L 22  20  20    Calcium 8.9 - 10.3 mg/dL 7.9  8.5  8.8    DG Chest 2 View Result Date: 12/22/2022 CLINICAL DATA:  Shortness of breath. EXAM: CHEST - 2 VIEW COMPARISON:  Jun 08, 2022. FINDINGS: The heart size and mediastinal contours are within normal limits. Both lungs are clear. The visualized skeletal structures are unremarkable. IMPRESSION: No active cardiopulmonary disease. Electronically Signed   By: Lupita Raider M.D.   On: 12/22/2022 15:18     Family Communication: Discussed with patient, he understand and agree. All questions answereed.   Disposition: Status is: Inpatient Remains inpatient appropriate because: alcohol withdrawal  Planned Discharge Destination: Home with Home Health     Time spent: 40 minutes  Author: Marcelino Duster, MD 12/23/2022 1:33 PM Secure chat 7am to 7pm For on call review [email or web address].

## 2022-12-23 NOTE — Progress Notes (Signed)
Patient is anxious, agitated and continues to be non compliment with keeping his telemetry on.  Patient states he wants to go AMA. Contacted B. Jon Billings NP who states family or friend needs to pick patient up. Patient decided he would stay here a little longer. Tele sitter applied for safety.

## 2022-12-24 DIAGNOSIS — R7989 Other specified abnormal findings of blood chemistry: Secondary | ICD-10-CM | POA: Diagnosis not present

## 2022-12-24 DIAGNOSIS — I10 Essential (primary) hypertension: Secondary | ICD-10-CM | POA: Diagnosis not present

## 2022-12-24 DIAGNOSIS — F10931 Alcohol use, unspecified with withdrawal delirium: Secondary | ICD-10-CM

## 2022-12-24 DIAGNOSIS — R17 Unspecified jaundice: Secondary | ICD-10-CM | POA: Diagnosis not present

## 2022-12-24 DIAGNOSIS — R569 Unspecified convulsions: Secondary | ICD-10-CM

## 2022-12-24 LAB — BASIC METABOLIC PANEL
Anion gap: 11 (ref 5–15)
BUN: 8 mg/dL (ref 6–20)
CO2: 20 mmol/L — ABNORMAL LOW (ref 22–32)
Calcium: 8.6 mg/dL — ABNORMAL LOW (ref 8.9–10.3)
Chloride: 107 mmol/L (ref 98–111)
Creatinine, Ser: 0.64 mg/dL (ref 0.61–1.24)
GFR, Estimated: >60 mL/min (ref >60)
Glucose, Bld: 109 mg/dL — ABNORMAL HIGH (ref 70–99)
Potassium: 3.4 mmol/L — ABNORMAL LOW (ref 3.5–5.1)
Sodium: 138 mmol/L (ref 135–145)

## 2022-12-24 LAB — CBC
HCT: 36 % — ABNORMAL LOW (ref 39.0–52.0)
Hemoglobin: 12.5 g/dL — ABNORMAL LOW (ref 13.0–17.0)
MCH: 35.1 pg — ABNORMAL HIGH (ref 26.0–34.0)
MCHC: 34.7 g/dL (ref 30.0–36.0)
MCV: 101.1 fL — ABNORMAL HIGH (ref 80.0–100.0)
Platelets: 105 10*3/uL — ABNORMAL LOW (ref 150–400)
RBC: 3.56 MIL/uL — ABNORMAL LOW (ref 4.22–5.81)
RDW: 15.2 % (ref 11.5–15.5)
WBC: 6.5 10*3/uL (ref 4.0–10.5)
nRBC: 0 % (ref 0.0–0.2)

## 2022-12-24 MED ORDER — PANTOPRAZOLE SODIUM 40 MG PO TBEC
40.0000 mg | DELAYED_RELEASE_TABLET | Freq: Every day | ORAL | Status: DC
  Administered 2022-12-24 – 2022-12-26 (×3): 40 mg via ORAL
  Filled 2022-12-24 (×3): qty 1

## 2022-12-24 MED ORDER — PHENOBARBITAL 32.4 MG PO TABS: 32.4000 mg | ORAL_TABLET | Freq: Three times a day (TID) | ORAL | Status: DC

## 2022-12-24 MED ORDER — PHENOBARBITAL SODIUM 130 MG/ML IJ SOLN
100.0000 mg | Freq: Once | INTRAMUSCULAR | Status: AC
  Administered 2022-12-24: 100 mg via INTRAVENOUS
  Filled 2022-12-24: qty 1

## 2022-12-24 MED ORDER — PHENOBARBITAL SODIUM 130 MG/ML IJ SOLN: 130.0000 mg | INTRAMUSCULAR | Status: DC

## 2022-12-24 MED ORDER — PHENOBARBITAL SODIUM 130 MG/ML IJ SOLN
130.0000 mg | INTRAMUSCULAR | Status: AC
  Administered 2022-12-24: 130 mg via INTRAVENOUS
  Filled 2022-12-24: qty 1

## 2022-12-24 MED ORDER — SODIUM CHLORIDE 0.9 % IV SOLN: 260.0000 mg | Freq: Once | INTRAVENOUS | Status: DC

## 2022-12-24 MED ORDER — LORAZEPAM 2 MG/ML IJ SOLN
1.0000 mg | INTRAMUSCULAR | Status: DC | PRN
  Administered 2022-12-25 – 2022-12-27 (×4): 1 mg via INTRAVENOUS
  Filled 2022-12-24 (×4): qty 1

## 2022-12-24 MED ORDER — LACTATED RINGERS IV SOLN: INTRAVENOUS | Status: AC

## 2022-12-24 MED ORDER — PHENOBARBITAL 97.2 MG PO TABS
97.2000 mg | ORAL_TABLET | Freq: Three times a day (TID) | ORAL | Status: AC
  Administered 2022-12-24 – 2022-12-25 (×5): 97.2 mg via ORAL
  Filled 2022-12-24 (×5): qty 1

## 2022-12-24 MED ORDER — PHENOBARBITAL 32.4 MG PO TABS
64.8000 mg | ORAL_TABLET | Freq: Three times a day (TID) | ORAL | Status: DC
  Administered 2022-12-25 – 2022-12-27 (×5): 64.8 mg via ORAL
  Filled 2022-12-24 (×5): qty 2

## 2022-12-24 NOTE — Progress Notes (Signed)
Patient remains anxious and agitated after phenobarbital. Tele sitter continues, patient continues to remove tele monitor. With soft restraints.

## 2022-12-24 NOTE — Plan of Care (Signed)
  Problem: Clinical Measurements: Goal: Will remain free from infection Outcome: Progressing   Problem: Pain Management: Goal: General experience of comfort will improve Outcome: Progressing   Problem: Skin Integrity: Goal: Risk for impaired skin integrity will decrease Outcome: Progressing

## 2022-12-24 NOTE — Progress Notes (Signed)
Writer unable to leave patients room due to safety. Non compliment with tele sitter. Patient is not stable on his feet. Continues to remove tele box. Cliffton Asters NP continues to update orders. Medications given as ordered. Very anxious see CIWA scores. Continues to require ativan every hour. Phenobarbital given as orsered as well.

## 2022-12-24 NOTE — Progress Notes (Signed)
12/24/22 0010:  Attempted to help pt stand at the bedside and urinate in the urinal. Pt then proceed to urinate all over the bedside table, floor, and himself. Pt would not follow commands and continued to try and leave the room. Additional staff arrived to help clean up the pt and room. Pt verbally abusive to staff. This RN attempted several times to reorient the pt and repeatedly explained to the pt what we were doing and why. Pt continues to verbally abuse staff and be non compliant with all safety measures. Manuela Schwartz, NP made aware. See new orders.   0330: Telesitter called the charge phone to inform me that the pt had ripped out his IV. This RN went into the room to find pt bleeding and IV in the bed. Pt and bed cleaned and a new gown was placed on the pt. This RN started a new IV and wrapped it in coban to protect it. Pt was still being verbally abusive to staff and attempting to get out of bed and be noncompliant with all safety measures. Telesitter order changed to a physical 1:1 sitter per Manuela Schwartz, NP. Lorena, NT placed in the room to sit with the pt.

## 2022-12-24 NOTE — Progress Notes (Signed)
Pt's point of contact and ex-spouse, [name], at bedside. Pt's wallet given to [name] for safe keeping. Pt's cell phone, charger, and jacket left at bedside.

## 2022-12-24 NOTE — Progress Notes (Signed)
Telesitter °

## 2022-12-24 NOTE — Progress Notes (Signed)
Progress Note   Patient: Lawrence Rowe [id number]:[id number] DOB: 1970-05-05 DOA: 12/22/2022     2 DOS: the patient was seen and examined on 12/24/2022   Brief hospital course: Lawrence Rowe is a 52 year old male with history of hypertension, GERD, alcohol abuse, admitted to hospitalist service for further management evaluation of alcohol withdrawal symptoms.  Last night CIWA scores high, he was confused, agitated and trying to get out of bed, placed on one-to-one sitter.   Assessment and Plan: * Alcohol withdrawal - moderate to severe Patient is still agitated, restless, requiring sitter, 2 point restraints. He will be continued on cheduled phenobarbital, Ativan as per CIWA protocol. Continue one-to-one sitter for safety. Continue to monitor vitals, saturations closely. Continue gentle IV fluids LR at 100 mL/h, clonidine as needed for elevated blood pressures. Continue telemetry monitoring, supportive care. Fall precaution, aspiration, seizure precaution  Primary hypertension BP stable Continue amlodipine 10 mg daily, losartan 50 mg daily, metoprolol succinate 50 mg daily.  Clonidine 0.1 mg p.o. 3 times daily as needed for SBP greater than 170.  GERD (gastroesophageal reflux disease) Continue Protonix 40 mg daily.  Alcohol abuse Elevated LFTs Continue thiamine, folate, multivitamin. Call cessation counseling once more alert and awake    Out of bed to chair. Incentive spirometry. Nursing supportive care. TOC consult for social issues. He lives alone and per ex girlfriend, his home is not safe place is not clean. DVT prophylaxis Lovenox.   Code Status: Full Code  Subjective: Patient is seen and examined today morning. He is restless, agitated and confused.  Patient is on 2 point restraints.  Secondary school teacher at bedside.  Physical Exam: Vitals:   12/24/22 0600 12/24/22 0629 12/24/22 0635 12/24/22 0639  BP: 137/89 137/89 125/73 125/73  Pulse: 84 84 77 77  Resp:       Temp:      TempSrc:      SpO2:      Weight:      Height:        General -  Middle aged Caucasian male, restless, confused. HEENT - PERRLA, EOMI, atraumatic head, non tender sinuses. Lung - Clear, diffuse rhonchi. Heart - S1, S2 heard, no murmurs, rubs, trace pedal edema. Abdomen - Soft, non tender, obese, bowel sounds good Neuro -confused, unable to do full neuroexam. Skin - Warm and dry. Hand drains noted  Data Reviewed:      Latest Ref Rng & Units 12/24/2022    4:51 AM 12/23/2022    2:26 AM 12/22/2022    1:12 PM  CBC  WBC 4.0 - 10.5 K/uL 6.5  5.0  6.1   Hemoglobin 13.0 - 17.0 g/dL 24.4  01.0  27.2   Hematocrit 39.0 - 52.0 % 36.0  30.6  34.8   Platelets 150 - 400 K/uL 105  90  100       Latest Ref Rng & Units 12/24/2022    4:51 AM 12/23/2022    2:26 AM 12/22/2022    1:12 PM  BMP  Glucose 70 - 99 mg/dL 536  644  034   BUN 6 - 20 mg/dL 8  10  7    Creatinine 0.61 - 1.24 mg/dL 7.42  5.95  6.38   Sodium 135 - 145 mmol/L 138  133  138   Potassium 3.5 - 5.1 mmol/L 3.4  3.4  3.9   Chloride 98 - 111 mmol/L 107  104  107   CO2 22 - 32 mmol/L 20  22  20    Calcium  8.9 - 10.3 mg/dL 8.6  7.9  8.5    No results found.   Disposition: Status is: Inpatient Remains inpatient appropriate because: alcohol withdrawal, requiring one-to-one sitter, restraints  Planned Discharge Destination: Home with Home Health     Time spent: 39 minutes  Author: Marcelino Duster, MD 12/24/2022 2:45 PM Secure chat 7am to 7pm For on call review [email or web address].

## 2022-12-24 NOTE — Consult Note (Signed)
PHARMACIST - PHYSICIAN COMMUNICATION  DR: Nyra Jabs, MD  CONCERNING: IV to Oral Route Change Policy  RECOMMENDATION: This patient is receiving pantoprazole by the intravenous route.  Based on criteria approved by the Pharmacy and Therapeutics Committee, the intravenous medication(s) is/are being converted to the equivalent oral dose form(s).   DESCRIPTION: These criteria include: The patient is eating (either orally or via tube) and/or has been taking other orally administered medications for a least 24 hours The patient has no evidence of active gastrointestinal bleeding or impaired GI absorption (gastrectomy, short bowel, patient on TNA or NPO).  If you have questions about this conversion, please contact the Pharmacy Department  []   [phone] )  [facility] [x]   [phone] )  [facility] []   [phone] )  [facility] []   [phone] )  [facility] []   [phone] )  Ilene Qua   Celene Squibb, PharmD Clinical Pharmacist 12/24/2022 1:13 PM

## 2022-12-24 NOTE — Progress Notes (Signed)
       CROSS COVER NOTE  NAME: Lawrence Rowe MRN: [id number] DOB : [birth year]    Concern as stated by nurse / staff   Patient with high CIWA scores and difficult to redirect. Repeatedly getting up out of bed without regard for safety     Pertinent findings on chart review: Patient admitted for Etoh withdrawal  Assessment and  Interventions   Assessment:    12/24/2022    1:53 AM 12/24/2022    1:52 AM 12/24/2022    1:51 AM  Vitals with BMI  Systolic 155 155 782  Diastolic 81 81 81  Pulse 100 100 100   Bedside patient confused to place and environment. Seeing toys in the room. Unable to reorient or redirect. Restrains now in plasce and second dose phenobarbital due now Plan: Continue ativan BUE wrist restraints Discontinue asap  Started on phenobarbital taper 1:1 sitter    Donnie Mesa NP [facility] Cross Cover 7pm-7am - check amion for availability Pager [phone]

## 2022-12-24 NOTE — Plan of Care (Signed)

## 2022-12-24 NOTE — Progress Notes (Signed)
Patient remains anxious at times. Patient was awake since 12/23/22 at 1900 and fell asleep at approximately 0515. Patient arouses at time with anxiety and agitation. Pulling at IV and trying to get OOB unassisted. Safety sitter at bedside for patients safety.

## 2022-12-25 ENCOUNTER — Encounter: Payer: Self-pay | Admitting: Internal Medicine

## 2022-12-25 DIAGNOSIS — F10930 Alcohol use, unspecified with withdrawal, uncomplicated: Secondary | ICD-10-CM | POA: Diagnosis not present

## 2022-12-25 MED ORDER — POTASSIUM CHLORIDE CRYS ER 20 MEQ PO TBCR
40.0000 meq | EXTENDED_RELEASE_TABLET | Freq: Once | ORAL | Status: AC
  Administered 2022-12-25: 40 meq via ORAL
  Filled 2022-12-25: qty 2

## 2022-12-25 NOTE — Discharge Instructions (Addendum)
No Driving   Intensive Outpatient Programs   Pensions consultant Health Services [facility] [address] [place],  [place], [place] [phone]  [facility] (Inpatient and outpatient)[phone] (Suboxone and Methadone) [address] [phone]  [facility]: Alcohol & Drug [facility] Programs - Intensive Outpatient [address] [place], [place] [address], [place]  [phone]  [facility] (Outpatient, Inpatient, [facility] (Groups and Residental) (insurance only) [phone] [place], [place] [phone]   [facility] (for caregivers and family) [address] Laurell Josephs [address], [place], [place] [phone]  Residential Treatment Programs  [facility](2 years) Residential: 2 days)[facility] [address] [place], [place], [place] [phone] or [phone]  [facility] [address] [place], [place], [place] [phone]  [facility] ([facility]) [address] [place], [place], [place] [phone] Admissions: 8am-3pm M-F  [facility] Program: Residential Program [phone] Days)             [facility]: [facility]  [place], [place], [place]  [phone] or [phone] in Hours over the weekend or by referral)  [facility] [address] [place], [place] [address] [phone] (Do virtual or phone assessment, offer transportation within 25 miles, have in patient and Outpatient options)   Mobil Crisis: [facility]:[phone]  (for crisis response 24 hours a day)      Rent/Utility/Housing  Agency Name: [facility] Address: [address] [place], [place] [address] Phone: [phone] Email: [email or web address] Website: www.alamanceservices.org Service(s) Offered: Housing services, self-sufficiency, congregate meal program, weatherization program, Field seismologist program, emergency food assistance,  housing counseling, home ownership program, wheels -towork program.  Agency Name: [facility] Address: [address], [place], [place] [address] Phone: [phone] (8a-4p) [phone] (8p- 10p) Email: [email or web address] Website: [email or web address].[email or web address].org Service(s) Offered: A program for homeless and/or needy men that includes one-on-one counseling, life skills training and job rehabilitation.  Agency Name: [employer] [facility] [place] Address: [address] [place], [place] [address] Phone: [phone] Website: www.alliedchurches.org Service(s) Offered: Assistance to needy in emergency with utility bills, heating fuel, and prescriptions. Shelter for homeless 7pm-7am. May 04, 2016 15  Agency Name: [facility] [place] (Developmentally Disabled) Address: [address], [place], [place] [address] Phone: [phone] Contact Person: Cathleen Corti Email: [email or web address] Website: LinkWedding.ca Service(s) Offered: Helps individuals with developmental disabilities move from housing that is more restrictive to homes where they  can achieve greater independence and have more  opportunities.  Agency Name: [facility] Address: [address], [place], [place] [address] Phone: [phone] Email: [email or web address] Website: www.burlingtonhousingauthority.org Service(s) Offered: Provides affordable housing for low-income families, elderly, and disabled individuals. Offer a wide range of  programs  and services, from financial planning to afterschool and summer programs.  Agency Name: [facility] of Social Services Address: [address] [place], [place] [address] Phone: [phone] Service(s) Offered: Child support services; child welfare services; food stamps; Medicaid; work first family assistance; and aid with fuel,  rent, food and medicine.  Agency Name: [facility] [place] [employer]. Address: [facility] [address] [place], [place]  [address] Phone: [phone] Website: www.familyabuseservices.org Service(s) Offered: 24 hour Crisis Line: [phone]; 24 hour Emergency Shelter; Transitional Housing; Support Groups; Scientist, physiological; [employer]; Hispanic  Outreach: [phone];  Visitation Center: [phone].  Agency Name: [facility] [place], [place]. Address: [address] [place], [place] [address] Phone: [phone] Service(s) Offered: CAP Services; Home and [employer]; Individual or Group Supports; Respite Care Non-Institutional Nursing;  Residential Supports; Respite Care and Personal Care Services; Transportation; Family and Friends Night; Recreational Activities; Three Nutritious Meals/Snacks; Consultation with Registered Dietician; Twenty-four hour Registered Nurse Access; Daily and [employer] and [employer]; Camp Green Leaves; Bryant for the [employer] (During Summer Months) Bingo Night (Every  Wednesday Night); Special Populations Dance Night  (Every Tuesday Night); Professional Hair Care Services.  Agency Name: [facility] Address: [address], [place], [place] [address] Phone: [phone] Contact Person: Jabier Mutton Website: [email or web address].Physicist, medical) Offered: Residential treatment facility for women; food and  clothing, educational & employment development and  transportation to work; Counsellor of financial skills;  parenting and family  reunification; emotional and spiritual  support; transitional housing for program graduates.  Agency Name: [facility] Address: [address] [place], [place] [address] Phone: [phone] Email: [email or web address] Website: TaskTown.es Service(s) Offered: Public housing units for elderly, disabled, and low income people; housing choice vouchers for income eligible  applicants; shelter plus care vouchers; and Psychologist, clinical.  Agency Name: [facility] [employer] Address: [address], [place], [place] [address] Phone: [phone] Email: habitat1@netzero .net Website: www.habitatalamance.org Service(s) Offered: Build houses for families in need of decent housing. Each adult in the family must invest 200 hours of labor on  someone else's house, work with volunteers to build their own house, attend classes on budgeting, home maintenance, yard care, and attend homeowner association meetings.  Agency Name: [facility] Address: [address] [place], [place] [address] Phone: [phone] Website: www.rsli.org Service(s) Offered: Intermediate care facilities for intellectually delayed, Supervised Living in group homes for adults with developmental disabilities, Supervised Living for people who have dual diagnoses (MRMI), Independent Living, Supported Living, respite and a variety of CAP services, pre-vocational services, day supports, and [employer].  Agency Name: [place] [employer] Phone: [phone] Website: www.NCForeclosurePrevention.gov Service(s) Offered: Zero-interest, deferred loans to homeowners struggling to pay their mortgage. Call for more information.

## 2022-12-25 NOTE — TOC Progression Note (Addendum)
Transition of Care [facility]) - Progression Note    Patient Details  Name: Lawrence Rowe MRN: [id number] Date of Birth: [birth year]  Transition [facility]) CM/SW Contact  Truddie Hidden, RN Phone Number: 12/25/2022, 4:23 PM  Clinical Narrative:    Patient is A0x2. Spoke with his ex wife, Lawrence Rowe. Lawrence Rowe stated Patient's mother and sister are not involved. She found patient covered in feces at his residence. "I had to put on shoe covers and find him some clothes that weren't covered." Lawrence Rowe stated patient has feces throughout his home covering walls and floors. Per Lawrence Rowe his utilities are not on. She stated he called her to take him to [facility] for detox. She was unable to assist him in the car. Per Lawrence Rowe patient may have just lost his home that was in foreclosure already. Patient has not made any recent payments on his home. Lawrence Rowe is following up with an attorney to determine if the home still belongs to the patient. [employer] will come today to decontaminate the home due the amount of human feces through out. Lawrence Rowe stated she has previously made a APS report on the patient. He was assigned a DSS worker, Lawrence Rowe whom she has not had contact with since summer.   RNCM contacted [facility] DSS to make a APS report. RNCM spoke with Lawrence Rowe from intake.         Expected Discharge Plan and Services                                               Social Determinants of Health (SDOH) Interventions SDOH Screenings   Food Insecurity: No Food Insecurity (12/23/2022)  Housing: High Risk (12/24/2022)  Transportation Needs: No Transportation Needs (12/23/2022)  Utilities: At Risk (12/24/2022)  Depression (PHQ2-9): Low Risk  (04/19/2021)  Tobacco Use: Medium Risk (12/22/2022)    Readmission Risk Interventions     No data to display

## 2022-12-25 NOTE — Progress Notes (Signed)
Patient is anxious and agitated this evening. Tele sitter remains. 2 calls from tele patient is getting out of bed. Remains confused alert to self and situation at this time. Ativan PRN given for agitation. Lights turned to low, TV off and warm blanket provided.

## 2022-12-25 NOTE — Progress Notes (Signed)
  Progress Note   Patient: Lawrence Rowe DOB: 09/13/1970 DOA: 12/22/2022     3 DOS: the patient was seen and examined on 12/25/2022   Brief hospital course: Lawrence Rowe is a 52 year old male with history of hypertension, GERD, alcohol abuse, admitted to hospitalist service for further management evaluation of alcohol withdrawal symptoms.  Last night CIWA scores high, he was confused, agitated and trying to get out of bed, placed on one-to-one sitter.   Assessment and Plan: * Alcohol withdrawal - moderate to severe Patient was agitated, restless, requiring sitter, 2 point restraints.  Improved today. --cont phenobarbital --ativan per CIWA  Primary hypertension BP stable Continue amlodipine 10 mg daily, losartan 50 mg daily, metoprolol succinate 50 mg daily.   GERD (gastroesophageal reflux disease) Continue Protonix 40 mg daily.  Alcohol abuse Elevated LFTs Call cessation counseling once more alert and awake --cont folic acid and thiamine     Out of bed to chair. Incentive spirometry. Nursing supportive care. TOC consult for social issues. He lives alone and per ex girlfriend, his home is not safe place is not clean. DVT prophylaxis Lovenox.   Code Status: Full Code  Subjective:  Pt was calm during rounding.  Said he did eat, and felt tired.   Physical Exam: Vitals:   12/25/22 0810 12/25/22 1133 12/25/22 1628 12/25/22 1942  BP: (!) 143/85 136/83 131/80 102/62  Pulse: 74 72 73 76  Resp: 18 20 18 20   Temp: 97.8 F (36.6 C) 98.6 F (37 C) 97.9 F (36.6 C) 98.1 F (36.7 C)  TempSrc: Oral Oral Oral Oral  SpO2: 98% 98% 98% 96%  Weight:      Height:        Constitutional: NAD, sleeping but easily arousable, oriented HEENT: conjunctivae and lids normal, EOMI CV: No cyanosis.   RESP: normal respiratory effort, on RA Neuro: II - XII grossly intact.     Data Reviewed:      Latest Ref Rng & Units 12/24/2022    4:51 AM 12/23/2022     2:26 AM 12/22/2022    1:12 PM  CBC  WBC 4.0 - 10.5 K/uL 6.5  5.0  6.1   Hemoglobin 13.0 - 17.0 g/dL 25.9  56.3  87.5   Hematocrit 39.0 - 52.0 % 36.0  30.6  34.8   Platelets 150 - 400 K/uL 105  90  100       Latest Ref Rng & Units 12/24/2022    4:51 AM 12/23/2022    2:26 AM 12/22/2022    1:12 PM  BMP  Glucose 70 - 99 mg/dL 643  329  518   BUN 6 - 20 mg/dL 8  10  7    Creatinine 0.61 - 1.24 mg/dL 8.41  6.60  6.30   Sodium 135 - 145 mmol/L 138  133  138   Potassium 3.5 - 5.1 mmol/L 3.4  3.4  3.9   Chloride 98 - 111 mmol/L 107  104  107   CO2 22 - 32 mmol/L 20  22  20    Calcium 8.9 - 10.3 mg/dL 8.6  7.9  8.5    No results found.   Disposition: Status is: Inpatient Remains inpatient appropriate because: alcohol withdrawal, requiring one-to-one sitter, restraints  Planned Discharge Destination: Home with Home Health     Time spent: 35 minutes  Author: Darlin Priestly, MD 12/25/2022 8:17 PM Secure chat 7am to 7pm For on call review [email or web address].

## 2022-12-26 DIAGNOSIS — F10930 Alcohol use, unspecified with withdrawal, uncomplicated: Secondary | ICD-10-CM | POA: Diagnosis not present

## 2022-12-26 LAB — BASIC METABOLIC PANEL
Anion gap: 8 (ref 5–15)
BUN: 13 mg/dL (ref 6–20)
CO2: 21 mmol/L — ABNORMAL LOW (ref 22–32)
Calcium: 8.2 mg/dL — ABNORMAL LOW (ref 8.9–10.3)
Chloride: 109 mmol/L (ref 98–111)
Creatinine, Ser: 0.58 mg/dL — ABNORMAL LOW (ref 0.61–1.24)
GFR, Estimated: >60 mL/min (ref >60)
Glucose, Bld: 94 mg/dL (ref 70–99)
Potassium: 3.4 mmol/L — ABNORMAL LOW (ref 3.5–5.1)
Sodium: 138 mmol/L (ref 135–145)

## 2022-12-26 LAB — MAGNESIUM: Magnesium: 2.1 mg/dL (ref 1.7–2.4)

## 2022-12-26 MED ORDER — POTASSIUM CHLORIDE CRYS ER 20 MEQ PO TBCR
40.0000 meq | EXTENDED_RELEASE_TABLET | Freq: Once | ORAL | Status: AC
  Administered 2022-12-26: 40 meq via ORAL
  Filled 2022-12-26: qty 2

## 2022-12-26 NOTE — TOC Progression Note (Signed)
Transition of Care [facility]) - Progression Note    Patient Details  Name: THORSON MONCRIEFF MRN: [id number] Date of Birth: Dec 09, 1970  [facility]) CM/SW Contact  Truddie Hidden, RN Phone Number: 12/26/2022, 10:16 AM  Clinical Narrative:    Attempt to reach [name], patient's ex wife. No answer. Left a message.   Retrieved call from [place]. Discussed discharge planning. Patient would not be able to discharge home with [name]. She stated she is disabled herself and not capable of attending to his needs. [name] stated she had spoken with patient's assigned DSS worker Lorita Officer. Patient's home ownership will come up for review today in court proceeding per [name].  Attempt to reach assigned DSS representative, Kourtney @ [phone]. No answer. Left message requesting call back.           Expected Discharge Plan and Services                                               Social Determinants of Health (SDOH) Interventions SDOH Screenings   Food Insecurity: No Food Insecurity (12/23/2022)  Housing: High Risk (12/24/2022)  Transportation Needs: No Transportation Needs (12/23/2022)  Utilities: At Risk (12/24/2022)  Depression (PHQ2-9): Low Risk  (04/19/2021)  Tobacco Use: Medium Risk (12/22/2022)    Readmission Risk Interventions     No data to display

## 2022-12-26 NOTE — Plan of Care (Signed)
  Problem: Clinical Measurements: Goal: Ability to maintain clinical measurements within normal limits will improve Outcome: Progressing   Problem: Nutrition: Goal: Adequate nutrition will be maintained Outcome: Progressing   Problem: Safety: Goal: Ability to remain free from injury will improve Outcome: Progressing   Problem: Skin Integrity: Goal: Risk for impaired skin integrity will decrease Outcome: Progressing   Problem: Safety: Goal: Non-violent Restraint(s) Outcome: Progressing

## 2022-12-26 NOTE — Progress Notes (Signed)
Progress Note   Patient: Lawrence Rowe PIR:[id number] DOB: 16-Jul-1970 DOA: 12/22/2022     4 DOS: the patient was seen and examined on 12/26/2022   Brief hospital course: Janelle Hathorne is a 52 year old male with history of hypertension, GERD, alcohol abuse, admitted to hospitalist service for further management evaluation of alcohol withdrawal symptoms.  Last night CIWA scores high, he was confused, agitated and trying to get out of bed, placed on one-to-one sitter.   Assessment and Plan: * Alcohol withdrawal - moderate to severe Patient was agitated, restless, requiring sitter, 2 point restraints.  Improved today. --cont phenobarbital with taper --ativan per CIWA  Primary hypertension Cont losartan 50 mg daily, metoprolol succinate 50 mg daily.  --hold amlodipine due to hypotension  Hypokalemia --monitor and supplement PRN  Alcohol abuse Elevated LFTs Call cessation counseling once more alert and awake --cont folic acid and thiamine     Out of bed to chair. Incentive spirometry. Nursing supportive care. TOC consult for social issues. He lives alone and per ex girlfriend, his home is not safe place is not clean. DVT prophylaxis Lovenox.   Code Status: Full Code  Subjective:  Pt was more alert and calm today.  Said he felt better.  Asked to have therapy session.   Physical Exam: Vitals:   12/26/22 0857 12/26/22 1257 12/26/22 1647 12/26/22 1937  BP: 125/80 118/73 (!) 97/56 (!) 94/58  Pulse: 70 70 73 73  Resp: 16 16 16 17   Temp: (!) 97.3 F (36.3 C) 98.1 F (36.7 C) 98.2 F (36.8 C) 98.4 F (36.9 C)  TempSrc:      SpO2: 98% 96% 96% 96%  Weight:      Height:        Constitutional: NAD, AAOx3 HEENT: conjunctivae and lids normal, EOMI CV: No cyanosis.   RESP: normal respiratory effort, on RA Neuro: II - XII grossly intact.   Psych: Normal mood and affect.  Appropriate judgement and reason   Data Reviewed:      Latest Ref Rng & Units  12/24/2022    4:51 AM 12/23/2022    2:26 AM 12/22/2022    1:12 PM  CBC  WBC 4.0 - 10.5 K/uL 6.5  5.0  6.1   Hemoglobin 13.0 - 17.0 g/dL 63.0  16.0  10.9   Hematocrit 39.0 - 52.0 % 36.0  30.6  34.8   Platelets 150 - 400 K/uL 105  90  100       Latest Ref Rng & Units 12/26/2022    5:09 AM 12/24/2022    4:51 AM 12/23/2022    2:26 AM  BMP  Glucose 70 - 99 mg/dL 94  323  557   BUN 6 - 20 mg/dL 13  8  10    Creatinine 0.61 - 1.24 mg/dL 3.22  0.25  4.27   Sodium 135 - 145 mmol/L 138  138  133   Potassium 3.5 - 5.1 mmol/L 3.4  3.4  3.4   Chloride 98 - 111 mmol/L 109  107  104   CO2 22 - 32 mmol/L 21  20  22    Calcium 8.9 - 10.3 mg/dL 8.2  8.6  7.9    No results found.   Disposition: Status is: Inpatient Remains inpatient appropriate because: alcohol withdrawal, requiring one-to-one sitter, restraints  Planned Discharge Destination: Home with Home Health     Time spent: 35 minutes  Author: Darlin Priestly, MD 12/26/2022 7:50 PM Secure chat 7am to 7pm For on call review [email or web address].

## 2022-12-27 ENCOUNTER — Other Ambulatory Visit: Payer: Self-pay

## 2022-12-27 DIAGNOSIS — F10930 Alcohol use, unspecified with withdrawal, uncomplicated: Secondary | ICD-10-CM | POA: Diagnosis not present

## 2022-12-27 LAB — BASIC METABOLIC PANEL
Anion gap: 9 (ref 5–15)
BUN: 10 mg/dL (ref 6–20)
CO2: 24 mmol/L (ref 22–32)
Calcium: 8.4 mg/dL — ABNORMAL LOW (ref 8.9–10.3)
Chloride: 103 mmol/L (ref 98–111)
Creatinine, Ser: 0.58 mg/dL — ABNORMAL LOW (ref 0.61–1.24)
GFR, Estimated: >60 mL/min (ref >60)
Glucose, Bld: 102 mg/dL — ABNORMAL HIGH (ref 70–99)
Potassium: 3.4 mmol/L — ABNORMAL LOW (ref 3.5–5.1)
Sodium: 136 mmol/L (ref 135–145)

## 2022-12-27 LAB — MAGNESIUM: Magnesium: 1.9 mg/dL (ref 1.7–2.4)

## 2022-12-27 MED ORDER — PHENOBARBITAL 32.4 MG PO TABS
32.4000 mg | ORAL_TABLET | Freq: Three times a day (TID) | ORAL | 0 refills | Status: DC
  Filled 2022-12-27 (×3): qty 6, 2d supply, fill #0

## 2022-12-27 MED ORDER — PHENOBARBITAL 30 MG PO TABS
30.0000 mg | ORAL_TABLET | Freq: Three times a day (TID) | ORAL | 0 refills | Status: DC
  Filled 2022-12-27: qty 6, 2d supply, fill #0

## 2022-12-27 MED ORDER — POTASSIUM CHLORIDE CRYS ER 20 MEQ PO TBCR
40.0000 meq | EXTENDED_RELEASE_TABLET | Freq: Once | ORAL | Status: AC
  Administered 2022-12-27: 40 meq via ORAL
  Filled 2022-12-27: qty 2

## 2022-12-27 MED ORDER — PHENOBARBITAL 32.4 MG PO TABS: 32.4000 mg | ORAL_TABLET | Freq: Three times a day (TID) | ORAL | 0 refills | Status: DC

## 2022-12-27 MED ORDER — ADULT MULTIVITAMIN W/MINERALS CH: 1.0000 | ORAL_TABLET | Freq: Every day | ORAL | Status: DC

## 2022-12-27 NOTE — Discharge Summary (Signed)
Physician Discharge Summary   Lawrence Rowe  male DOB: 1970/07/12  BJY:[id number]  PCP: Pcp, No  Admit date: 12/22/2022 Discharge date: 12/27/2022  Admitted From: home Disposition:  home CODE STATUS: Full code  Discharge Instructions     Diet - low sodium heart healthy   Complete by: As directed    Discharge instructions   Complete by: As directed    Please finish 2 more days of Phenobarbital as directed for your alcohol withdrawal.  Hold your amlodipine and losartan until followup with your outpatient provider since your blood pressure has been normal without them. [facility] Course:  For full details, please see H&P, progress notes, consult notes and ancillary notes.  Briefly,  Kindrick Kraai is a 52 year old male with history of hypertension, alcohol abuse, admitted to hospitalist service for further management of alcohol withdrawal symptoms.   * Alcohol withdrawal - moderate to severe Patient was initially agitated, restless, requiring sitter, 2 point restraints.  Improved the next day.  Started on phenobarbital with taper and ativan per CIWA.  Pt's mental status was back to baseline and ambulated with walker independently before discharge.  Phenobarbital Rx was provided to pt before discharge to finish at home.   Primary hypertension --cont home Toprol --hold amlodipine and losartan due to soft BP   Hypokalemia --monitored and supplemented PRN   Alcohol abuse Elevated LFTs --cont folic acid and thiamine   Discharge Diagnoses:  Principal Problem:   Alcohol withdrawal with inpatient treatment [facility]) Active Problems:   Alcohol abuse   Primary hypertension   Tachycardia with hypertension   Wernicke encephalopathy syndrome   Elevated LFTs   Serum total bilirubin elevated   GERD (gastroesophageal reflux disease)   Truncal obesity   30 Day Unplanned Readmission Risk Score    Flowsheet Row ED to Hosp-Admission (Current) from  12/22/2022 in [facility]  30 Day Unplanned Readmission Risk Score (%) 17.01 Filed at 12/27/2022 0801       This score is the patient's risk of an unplanned readmission within 30 days of being discharged (0 -100%). The score is based on dignosis, age, lab data, medications, orders, and past utilization.   Low:  0-14.9   Medium: 15-21.9   High: 22-29.9   Extreme: 30 and above         Discharge Instructions:  Allergies as of 12/27/2022       Reactions   Penicillins Other (See Comments)        Medication List     PAUSE taking these medications    amLODipine 10 MG tablet Wait to take this until your doctor or other care provider tells you to start again. Since your blood pressure was normal without this. Commonly known as: NORVASC Take 1 tablet (10 mg total) by mouth daily.   losartan 50 MG tablet Wait to take this until your doctor or other care provider tells you to start again. Since your blood pressure was normal without this. Commonly known as: COZAAR Take 1 tablet (50 mg total) by mouth daily.       TAKE these medications    folic acid 1 MG tablet Commonly known as: FOLVITE Take 1 tablet (1 mg total) by mouth daily.   metoprolol succinate 50 MG 24 hr tablet Commonly known as: TOPROL-XL Take 1 tablet (50 mg total) by mouth daily. Take with or immediately following a meal.   multivitamin with minerals Tabs tablet Take 1 tablet  by mouth daily. Start taking on: December 28, 2022   omeprazole 10 MG capsule Commonly known as: PRILOSEC Take 1 capsule (10 mg total) by mouth daily.   PHENObarbital 30 MG tablet Commonly known as: LUMINAL Take 1 tablet (30 mg total) by mouth 3 (three) times daily for 2 days.   thiamine 100 MG tablet Commonly known as: VITAMIN B1 Take 1 tablet (100 mg total) by mouth daily.               Durable Medical Equipment  (From admission, onward)           Start     Ordered   12/27/22 0957  For  home use only DME Walker rolling  Once       Question Answer Comment  Walker: With 5 Inch Wheels   Patient needs a walker to treat with the following condition Weakness      12/27/22 0957             Follow-up Information     Your PCP Follow up in 1 week(s).                  Allergies  Allergen Reactions   Penicillins Other (See Comments)     The results of significant diagnostics from this hospitalization (including imaging, microbiology, ancillary and laboratory) are listed below for reference.   Consultations:   Procedures/Studies: DG Chest 2 View Result Date: 12/22/2022 CLINICAL DATA:  Shortness of breath. EXAM: CHEST - 2 VIEW COMPARISON:  Jun 08, 2022. FINDINGS: The heart size and mediastinal contours are within normal limits. Both lungs are clear. The visualized skeletal structures are unremarkable. IMPRESSION: No active cardiopulmonary disease. Electronically Signed   By: Lupita Raider M.D.   On: 12/22/2022 15:18      Labs: BNP (last 3 results) Recent Labs    01/13/22 0641 12/22/22 1313  BNP 174.6* 70.8   Basic Metabolic Panel: Recent Labs  Lab 12/22/22 1312 12/22/22 1723 12/23/22 0226 12/24/22 0451 12/26/22 0509 12/27/22 0503  NA 138  --  133* 138 138 136  K 3.9  --  3.4* 3.4* 3.4* 3.4*  CL 107  --  104 107 109 103  CO2 20*  --  22 20* 21* 24  GLUCOSE 131*  --  103* 109* 94 102*  BUN 7  --  10 8 13 10   CREATININE 0.57*  --  0.55* 0.64 0.58* 0.58*  CALCIUM 8.5*  --  7.9* 8.6* 8.2* 8.4*  MG  --  1.8  --   --  2.1 1.9  PHOS  --  2.9  --   --   --   --    Liver Function Tests: Recent Labs  Lab 12/22/22 1312 12/23/22 0226  AST 116* 84*  ALT 50* 38  ALKPHOS 70 57  BILITOT 1.7* 2.6*  PROT 7.5 6.7  ALBUMIN 3.4* 2.9*   No results for input(s): "LIPASE", "AMYLASE" in the last 168 hours. No results for input(s): "AMMONIA" in the last 168 hours. CBC: Recent Labs  Lab 12/22/22 1312 12/23/22 0226 12/24/22 0451  WBC 6.1 5.0 6.5   NEUTROABS 4.6  --   --   HGB 12.1* 10.8* 12.5*  HCT 34.8* 30.6* 36.0*  MCV 99.7 99.7 101.1*  PLT 100* 90* 105*   Cardiac Enzymes: Recent Labs  Lab 12/22/22 1312  CKTOTAL 78   BNP: Invalid input(s): "POCBNP" CBG: No results for input(s): "GLUCAP" in the last 168 hours. D-Dimer No results  for input(s): "DDIMER" in the last 72 hours. Hgb A1c No results for input(s): "HGBA1C" in the last 72 hours. Lipid Profile No results for input(s): "CHOL", "HDL", "LDLCALC", "TRIG", "CHOLHDL", "LDLDIRECT" in the last 72 hours. Thyroid function studies No results for input(s): "TSH", "T4TOTAL", "T3FREE", "THYROIDAB" in the last 72 hours.  Invalid input(s): "FREET3" Anemia work up No results for input(s): "VITAMINB12", "FOLATE", "FERRITIN", "TIBC", "IRON", "RETICCTPCT" in the last 72 hours. Urinalysis    Component Value Date/Time   COLORURINE YELLOW (A) 12/22/2022 1722   APPEARANCEUR CLEAR (A) 12/22/2022 1722   LABSPEC 1.019 12/22/2022 1722   PHURINE 8.0 12/22/2022 1722   GLUCOSEU NEGATIVE 12/22/2022 1722   HGBUR NEGATIVE 12/22/2022 1722   BILIRUBINUR NEGATIVE 12/22/2022 1722   KETONESUR 5 (A) 12/22/2022 1722   PROTEINUR NEGATIVE 12/22/2022 1722   NITRITE NEGATIVE 12/22/2022 1722   LEUKOCYTESUR NEGATIVE 12/22/2022 1722   Sepsis Labs Recent Labs  Lab 12/22/22 1312 12/23/22 0226 12/24/22 0451  WBC 6.1 5.0 6.5   Microbiology No results found for this or any previous visit (from the past 240 hours).   Total time spend on discharging this patient, including the last patient exam, discussing the hospital stay, instructions for ongoing care as it relates to all pertinent caregivers, as well as preparing the medical discharge records, prescriptions, and/or referrals as applicable, is 50 minutes.    Darlin Priestly, MD  [facility] 12/27/2022, 11:02 AM

## 2022-12-27 NOTE — Progress Notes (Addendum)
Mobility Specialist - Progress Note   12/27/22 0929  Mobility  Activity Ambulated with assistance in hallway  Level of Assistance Standby assist, set-up cues, supervision of patient - no hands on  Assistive Device Front wheel walker  Distance Ambulated (ft) 240 ft  Activity Response Tolerated well  Mobility visit 1 Mobility  Mobility Specialist Start Time (ACUTE ONLY) [id number]  Mobility Specialist Stop Time (ACUTE ONLY) [id number]  Mobility Specialist Time Calculation (min) (ACUTE ONLY) 15 min   Pt supine upon entry, utilizing RA. Pt agreeable to OOB amb in the hallway this date, expressing cramping in bilateral feet. Pt completed bed mob indep, STS to RW ModI-- poor safety awareness upon standing as Pt pulls on the RW to stand. Pt amb one lap around the NS with RW SBA-MinG, slightly unsteady-- cuing for body position within the RW. Reports cramps subside in feet during amb. Pt amb an additional 80 ft without the RW, requiring HHA of a single UE while holding onto the railing when available-- weight shifting to HHA noted. Pt returned to the room, left supine with alarm set and needs within reach. RN notified.  Zetta Bills Mobility Specialist 12/27/22 9:43 AM

## 2022-12-27 NOTE — Plan of Care (Signed)
  Problem: Health Behavior/Discharge Planning: Goal: Ability to manage health-related needs will improve Outcome: Progressing   Problem: Clinical Measurements: Goal: Ability to maintain clinical measurements within normal limits will improve Outcome: Progressing   Problem: Coping: Goal: Level of anxiety will decrease Outcome: Progressing   Problem: Safety: Goal: Ability to remain free from injury will improve Outcome: Progressing   

## 2022-12-27 NOTE — TOC Progression Note (Addendum)
Transition of Care [facility]) - Progression Note    Patient Details  Name: Lawrence Rowe MRN: [id number] Date of Birth: 03/21/1970  [facility]) CM/SW Contact  Truddie Hidden, RN Phone Number: 12/27/2022, 10:46 AM  Clinical Narrative:    Leisa Lenz requested via Cletis Athens at [employer].   Spoke with patient's ex-wife [name]. She was unaware of discharge. She stated she and DSS worker, Guss Bunde would be meeting to have [employer] decontaminate the home. She was advised patient is no longer meeting medical necessity and would be discharge with a RW and resources for homeless shelters, and housing information.   Spoke with Kourtney from DSS to advised patient is discharging home today. She was advised prior to admitting patient  had been accepted to [facility] for detox. She was also advised of resources provided to patient  Spoke with patient. He stated he wanted to go home. He was advised of resources provided. Patient's mother will transport him home.      12:40pm Spoke with patient at bedside. He does not have transportation home. He was advised [facility] would transport him home.   2:12pm [facility] arranged. Patient expected to be picked up at [facility] at approximately 4pm.   TOC signing off           Expected Discharge Plan and Services         Expected Discharge Date: 12/27/22                                     Social Determinants of Health (SDOH) Interventions SDOH Screenings   Food Insecurity: No Food Insecurity (12/23/2022)  Housing: High Risk (12/24/2022)  Transportation Needs: No Transportation Needs (12/23/2022)  Utilities: At Risk (12/24/2022)  Depression (PHQ2-9): Low Risk  (04/19/2021)  Tobacco Use: Medium Risk (12/22/2022)    Readmission Risk Interventions     No data to display

## 2022-12-28 ENCOUNTER — Other Ambulatory Visit: Payer: Self-pay

## 2023-04-05 ENCOUNTER — Inpatient Hospital Stay
Admission: EM | Admit: 2023-04-05 | Discharge: 2023-04-07 | DRG: 897 | Disposition: A | Discharge status: Home Health | Attending: Internal Medicine | Admitting: Internal Medicine

## 2023-04-05 ENCOUNTER — Telehealth: Payer: Self-pay | Admitting: Surgery

## 2023-04-05 ENCOUNTER — Other Ambulatory Visit: Payer: Self-pay

## 2023-04-05 ENCOUNTER — Encounter: Payer: Self-pay | Admitting: *Deleted

## 2023-04-05 DIAGNOSIS — R17 Unspecified jaundice: Secondary | ICD-10-CM | POA: Diagnosis present

## 2023-04-05 DIAGNOSIS — R000 Tachycardia, unspecified: Secondary | ICD-10-CM

## 2023-04-05 DIAGNOSIS — Z87891 Personal history of nicotine dependence: Secondary | ICD-10-CM

## 2023-04-05 DIAGNOSIS — I10 Essential (primary) hypertension: Secondary | ICD-10-CM | POA: Diagnosis not present

## 2023-04-05 DIAGNOSIS — Z79899 Other long term (current) drug therapy: Secondary | ICD-10-CM

## 2023-04-05 DIAGNOSIS — I110 Hypertensive heart disease with heart failure: Secondary | ICD-10-CM | POA: Diagnosis present

## 2023-04-05 DIAGNOSIS — I5032 Chronic diastolic (congestive) heart failure: Secondary | ICD-10-CM | POA: Diagnosis present

## 2023-04-05 DIAGNOSIS — K219 Gastro-esophageal reflux disease without esophagitis: Secondary | ICD-10-CM | POA: Diagnosis present

## 2023-04-05 DIAGNOSIS — L309 Dermatitis, unspecified: Secondary | ICD-10-CM | POA: Diagnosis present

## 2023-04-05 DIAGNOSIS — F10930 Alcohol use, unspecified with withdrawal, uncomplicated: Principal | ICD-10-CM

## 2023-04-05 DIAGNOSIS — F10939 Alcohol use, unspecified with withdrawal, unspecified: Secondary | ICD-10-CM | POA: Diagnosis present

## 2023-04-05 DIAGNOSIS — Z800 Family history of malignant neoplasm of digestive organs: Secondary | ICD-10-CM

## 2023-04-05 DIAGNOSIS — F10231 Alcohol dependence with withdrawal delirium: Principal | ICD-10-CM | POA: Diagnosis present

## 2023-04-05 LAB — URINALYSIS, ROUTINE W REFLEX MICROSCOPIC
Bilirubin Urine: NEGATIVE
Glucose, UA: NEGATIVE mg/dL
Hgb urine dipstick: NEGATIVE
Ketones, ur: 5 mg/dL — AB
Leukocytes,Ua: NEGATIVE
Nitrite: NEGATIVE
Protein, ur: NEGATIVE mg/dL
Specific Gravity, Urine: 1.006 (ref 1.005–1.030)
pH: 7 (ref 5.0–8.0)

## 2023-04-05 LAB — CBC
HCT: 36 % — ABNORMAL LOW (ref 39.0–52.0)
Hemoglobin: 12.1 g/dL — ABNORMAL LOW (ref 13.0–17.0)
MCH: 32.9 pg (ref 26.0–34.0)
MCHC: 33.6 g/dL (ref 30.0–36.0)
MCV: 97.8 fL (ref 80.0–100.0)
Platelets: 117 10*3/uL — ABNORMAL LOW (ref 150–400)
RBC: 3.68 MIL/uL — ABNORMAL LOW (ref 4.22–5.81)
RDW: 17.2 % — ABNORMAL HIGH (ref 11.5–15.5)
WBC: 7.9 10*3/uL (ref 4.0–10.5)
nRBC: 0 % (ref 0.0–0.2)

## 2023-04-05 LAB — CBG MONITORING, ED: Glucose-Capillary: 128 mg/dL — ABNORMAL HIGH (ref 70–99)

## 2023-04-05 LAB — BASIC METABOLIC PANEL WITH GFR
Anion gap: 14 (ref 5–15)
BUN: 7 mg/dL (ref 6–20)
CO2: 20 mmol/L — ABNORMAL LOW (ref 22–32)
Calcium: 8.7 mg/dL — ABNORMAL LOW (ref 8.9–10.3)
Chloride: 104 mmol/L (ref 98–111)
Creatinine, Ser: 0.58 mg/dL — ABNORMAL LOW (ref 0.61–1.24)
GFR, Estimated: >60 mL/min (ref >60)
Glucose, Bld: 131 mg/dL — ABNORMAL HIGH (ref 70–99)
Potassium: 3.9 mmol/L (ref 3.5–5.1)
Sodium: 138 mmol/L (ref 135–145)

## 2023-04-05 LAB — MAGNESIUM: Magnesium: 1.8 mg/dL (ref 1.7–2.4)

## 2023-04-05 LAB — HEPATIC FUNCTION PANEL
ALT: 41 U/L (ref 0–44)
AST: 107 U/L — ABNORMAL HIGH (ref 15–41)
Albumin: 3.5 g/dL (ref 3.5–5.0)
Alkaline Phosphatase: 71 U/L (ref 38–126)
Bilirubin, Direct: 0.6 mg/dL — ABNORMAL HIGH (ref 0.0–0.2)
Indirect Bilirubin: 1.3 mg/dL — ABNORMAL HIGH (ref 0.3–0.9)
Total Bilirubin: 1.9 mg/dL — ABNORMAL HIGH (ref 0.0–1.2)
Total Protein: 7.7 g/dL (ref 6.5–8.1)

## 2023-04-05 LAB — ETHANOL: Alcohol, Ethyl (B): 36 mg/dL — ABNORMAL HIGH (ref <10)

## 2023-04-05 MED ORDER — LORAZEPAM 2 MG/ML IJ SOLN
1.0000 mg | INTRAMUSCULAR | Status: DC | PRN
  Administered 2023-04-06: 2 mg via INTRAVENOUS
  Filled 2023-04-05: qty 1

## 2023-04-05 MED ORDER — ADULT MULTIVITAMIN W/MINERALS CH
1.0000 | ORAL_TABLET | Freq: Every day | ORAL | Status: DC
  Administered 2023-04-05 – 2023-04-07 (×3): 1 via ORAL
  Filled 2023-04-05 (×3): qty 1

## 2023-04-05 MED ORDER — PHENOBARBITAL SODIUM 65 MG/ML IJ SOLN: 65.0000 mg | Freq: Three times a day (TID) | INTRAMUSCULAR | Status: DC

## 2023-04-05 MED ORDER — SODIUM CHLORIDE 0.9 % IV SOLN: INTRAVENOUS | Status: AC

## 2023-04-05 MED ORDER — ENOXAPARIN SODIUM 40 MG/0.4ML IJ SOSY
40.0000 mg | PREFILLED_SYRINGE | INTRAMUSCULAR | Status: DC
  Administered 2023-04-05 – 2023-04-06 (×2): 40 mg via SUBCUTANEOUS
  Filled 2023-04-05 (×2): qty 0.4

## 2023-04-05 MED ORDER — PHENOBARBITAL SODIUM 130 MG/ML IJ SOLN
130.0000 mg | Freq: Once | INTRAMUSCULAR | Status: AC
  Administered 2023-04-05: 130 mg via INTRAVENOUS
  Filled 2023-04-05: qty 1

## 2023-04-05 MED ORDER — PHENOBARBITAL SODIUM 65 MG/ML IJ SOLN: 32.5000 mg | Freq: Three times a day (TID) | INTRAMUSCULAR | Status: DC

## 2023-04-05 MED ORDER — THIAMINE MONONITRATE 100 MG PO TABS
100.0000 mg | ORAL_TABLET | Freq: Every day | ORAL | Status: DC
  Administered 2023-04-05 – 2023-04-07 (×3): 100 mg via ORAL
  Filled 2023-04-05 (×3): qty 1

## 2023-04-05 MED ORDER — PHENOBARBITAL SODIUM 130 MG/ML IJ SOLN
97.5000 mg | Freq: Three times a day (TID) | INTRAMUSCULAR | Status: DC
  Administered 2023-04-05 – 2023-04-06 (×4): 97.5 mg via INTRAVENOUS
  Filled 2023-04-05: qty 0.75
  Filled 2023-04-05 (×2): qty 1
  Filled 2023-04-05: qty 0.75

## 2023-04-05 MED ORDER — SODIUM CHLORIDE 0.9 % IV SOLN
260.0000 mg | Freq: Once | INTRAVENOUS | Status: DC
  Filled 2023-04-05: qty 2

## 2023-04-05 MED ORDER — SODIUM CHLORIDE 0.9 % IV BOLUS
1000.0000 mL | Freq: Once | INTRAVENOUS | Status: AC
  Administered 2023-04-05: 1000 mL via INTRAVENOUS

## 2023-04-05 MED ORDER — METOPROLOL SUCCINATE ER 50 MG PO TB24
50.0000 mg | ORAL_TABLET | Freq: Every day | ORAL | Status: DC
  Administered 2023-04-05 – 2023-04-07 (×3): 50 mg via ORAL
  Filled 2023-04-05 (×4): qty 1

## 2023-04-05 MED ORDER — LORAZEPAM 2 MG/ML IJ SOLN
2.0000 mg | Freq: Once | INTRAMUSCULAR | Status: AC
Start: 2023-04-05 — End: 2023-04-05
  Administered 2023-04-05: 2 mg via INTRAVENOUS
  Filled 2023-04-05: qty 1

## 2023-04-05 MED ORDER — THIAMINE HCL 100 MG/ML IJ SOLN
100.0000 mg | Freq: Every day | INTRAMUSCULAR | Status: DC
  Filled 2023-04-05: qty 1
  Filled 2023-04-05: qty 2

## 2023-04-05 MED ORDER — LORAZEPAM 2 MG/ML IJ SOLN: 1.0000 mg | INTRAMUSCULAR | Status: DC | PRN

## 2023-04-05 MED ORDER — ONDANSETRON HCL 4 MG PO TABS: 4.0000 mg | ORAL_TABLET | Freq: Four times a day (QID) | ORAL | Status: DC | PRN

## 2023-04-05 MED ORDER — THIAMINE HCL 100 MG/ML IJ SOLN
100.0000 mg | Freq: Once | INTRAMUSCULAR | Status: AC
  Administered 2023-04-05: 100 mg via INTRAVENOUS
  Filled 2023-04-05: qty 2

## 2023-04-05 MED ORDER — LORAZEPAM 1 MG PO TABS: 1.0000 mg | ORAL_TABLET | ORAL | Status: DC | PRN

## 2023-04-05 MED ORDER — ONDANSETRON HCL 4 MG/2ML IJ SOLN: 4.0000 mg | Freq: Four times a day (QID) | INTRAMUSCULAR | Status: DC | PRN

## 2023-04-05 MED ORDER — FOLIC ACID 1 MG PO TABS
1.0000 mg | ORAL_TABLET | Freq: Every day | ORAL | Status: DC
  Administered 2023-04-05 – 2023-04-07 (×3): 1 mg via ORAL
  Filled 2023-04-05 (×3): qty 1

## 2023-04-05 NOTE — ED Notes (Signed)
 CCMD called to move pt to [facility]

## 2023-04-05 NOTE — ED Triage Notes (Signed)
 First Nurse Note:  Pt via [facility] from home. Pt c/o generalized weakness, pt is unable to take care of his. Does not have any electricity in the home. Pt is ETOH user daily last drink was 0200. Has bilateral redness and edema in his feet. Pt is A&Ox4 and NAD 98.7 oral  130 HR  155/76 with hx of HTN  131 CBG

## 2023-04-05 NOTE — Assessment & Plan Note (Addendum)
+   sacral redness concerning for contact dermatitis vs. Poor overall hygiene  Minimal pruritis Wound care consulted  Monitor

## 2023-04-05 NOTE — ED Provider Notes (Signed)
 [facility] Provider Note    Event Date/Time   First MD Initiated Contact with Patient 04/05/23 1309     (approximate)   History   Chief Complaint Weakness and Alcohol Problem   HPI  Lawrence Rowe is a 53 y.o. male with past medical history of hypertension, CHF, alcohol abuse, Wernicke's encephalopathy, PAD, and GERD who presents to the ED for weakness.  Patient reports that he has been feeling increasingly weak over the past 12 hours and is concerned about alcohol withdrawal.  He states he drinks about 12 beers daily with his last drink coming sometime yesterday afternoon or evening.  He does not remember exactly when his last drink was, denies any liquor consumption or drug use.  He states that he has been feeling increasingly weak and shaky, has had a hard time walking at home.  He reports history of alcohol withdrawal requiring admission to the hospital, denies history of seizures.     Physical Exam   Triage Vital Signs: ED Triage Vitals  Encounter Vitals Group     BP 04/05/23 1220 133/80     Systolic BP Percentile --      Diastolic BP Percentile --      Pulse Rate 04/05/23 1220 (!) 135     Resp 04/05/23 1220 20     Temp 04/05/23 1220 98.3 F (36.8 C)     Temp Source 04/05/23 1220 Oral     SpO2 04/05/23 1220 96 %     Weight --      Height --      Head Circumference --      Peak Flow --      Pain Score 04/05/23 1226 8     Pain Loc --      Pain Education --      Exclude from Growth Chart --     Most recent vital signs: Vitals:   04/05/23 1220 04/05/23 1438  BP: 133/80 (!) 163/88  Pulse: (!) 135 (!) 117  Resp: 20 20  Temp: 98.3 F (36.8 C)   SpO2: 96% 93%    Constitutional: Alert and oriented. Eyes: Conjunctivae are normal. Head: Atraumatic. Nose: No congestion/rhinnorhea. Mouth/Throat: Mucous membranes are very dry. Cardiovascular: Tachycardic, regular rhythm. Grossly normal heart sounds.  2+ radial pulses  bilaterally. Respiratory: Normal respiratory effort.  No retractions. Lungs CTAB. Gastrointestinal: Soft and nontender. No distention. Musculoskeletal: No lower extremity tenderness nor edema.  Neurologic:  Normal speech and language. No gross focal neurologic deficits are appreciated.  Tremulous with tongue fasciculations noted.    ED Results / Procedures / Treatments   Labs (all labs ordered are listed, but only abnormal results are displayed) Labs Reviewed  BASIC METABOLIC PANEL WITH GFR - Abnormal; Notable for the following components:      Result Value   CO2 20 (*)    Glucose, Bld 131 (*)    Creatinine, Ser 0.58 (*)    Calcium 8.7 (*)    All other components within normal limits  CBC - Abnormal; Notable for the following components:   RBC 3.68 (*)    Hemoglobin 12.1 (*)    HCT 36.0 (*)    RDW 17.2 (*)    Platelets 117 (*)    All other components within normal limits  HEPATIC FUNCTION PANEL - Abnormal; Notable for the following components:   AST 107 (*)    Total Bilirubin 1.9 (*)    Bilirubin, Direct 0.6 (*)    Indirect Bilirubin  1.3 (*)    All other components within normal limits  ETHANOL - Abnormal; Notable for the following components:   Alcohol, Ethyl (B) 36 (*)    All other components within normal limits  CBG MONITORING, ED - Abnormal; Notable for the following components:   Glucose-Capillary 128 (*)    All other components within normal limits  MAGNESIUM  URINALYSIS, ROUTINE W REFLEX MICROSCOPIC     EKG  ED ECG REPORT I, Chesley Noon, the attending physician, personally viewed and interpreted this ECG.   Date: 04/05/2023  EKG Time: 12:29  Rate: 140  Rhythm: sinus tachycardia  Axis: Normal  Intervals:none  ST&T Change: None  PROCEDURES:  Critical Care performed: Yes, see critical care procedure note(s)  .Critical Care  Performed by: Chesley Noon, MD Authorized by: Chesley Noon, MD   Critical care provider statement:    Critical care  time (minutes):  30   Critical care time was exclusive of:  Separately billable procedures and treating other patients and teaching time   Critical care was necessary to treat or prevent imminent or life-threatening deterioration of the following conditions:  Metabolic crisis   Critical care was time spent personally by me on the following activities:  Development of treatment plan with patient or surrogate, discussions with consultants, evaluation of patient's response to treatment, examination of patient, ordering and review of laboratory studies, ordering and review of radiographic studies, ordering and performing treatments and interventions, pulse oximetry, re-evaluation of patient's condition and review of old charts   I assumed direction of critical care for this patient from another provider in my specialty: no     Care discussed with: admitting provider      MEDICATIONS ORDERED IN ED: Medications  LORazepam (ATIVAN) injection 1 mg (has no administration in time range)  PHENObarbital (LUMINAL) injection 97.5 mg (has no administration in time range)    Followed by  PHENObarbital (LUMINAL) injection 65 mg (has no administration in time range)    Followed by  PHENObarbital (LUMINAL) injection 32.5 mg (has no administration in time range)  sodium chloride 0.9 % bolus 1,000 mL (0 mLs Intravenous Stopped 04/05/23 1441)  thiamine (VITAMIN B1) injection 100 mg (100 mg Intravenous Given 04/05/23 1341)  PHENObarbital (LUMINAL) injection 130 mg (130 mg Intravenous Given 04/05/23 1403)    Followed by  PHENObarbital (LUMINAL) injection 130 mg (130 mg Intravenous Given 04/05/23 1405)  LORazepam (ATIVAN) injection 2 mg (2 mg Intravenous Given 04/05/23 1438)     IMPRESSION / MDM / ASSESSMENT AND PLAN / ED COURSE  I reviewed the triage vital signs and the nursing notes.                              53 y.o. male with past medical history of hypertension, CHF, alcohol abuse, Warnicke's encephalopathy,  PAD, and GERD who presents to the ED complaining of generalized weakness and feeling very shaky with difficulty walking after last alcoholic beverage yesterday afternoon or evening.  Patient's presentation is most consistent with acute presentation with potential threat to life or bodily function.  Differential diagnosis includes, but is not limited to, alcohol withdrawal, delirium tremens, anemia, electrolyte abnormality, AKI, hepatitis, Warnicke's encephalopathy.  Patient ill-appearing, tachycardic and very tremulous with tongue fasciculations, also mildly hypertensive.  EKG shows sinus tachycardia and he appears to be in severe alcohol withdrawal, although currently oriented and does not yet appear to be in delirium tremens.  He was given  bolus dose of IV phenobarbital along with IV thiamine, symptoms seem to be improving but will give additional IV Ativan.  Orders placed for phenobarbital taper by pharmacist, labs without significant anemia, leukocytosis, electrolyte abnormality, or AKI.  LFTs with mildly elevated AST and bilirubin, overall similar to previous.  Case discussed with hospitalist for admission for treatment of alcohol withdrawal.      FINAL CLINICAL IMPRESSION(S) / ED DIAGNOSES   Final diagnoses:  Alcohol withdrawal syndrome without complication (HCC)     Rx / DC Orders   ED Discharge Orders     None        Note:  This document was prepared using Dragon voice recognition software and may include unintentional dictation errors.   Chesley Noon, MD 04/05/23 [phone]

## 2023-04-05 NOTE — Assessment & Plan Note (Addendum)
 Alcohol abuse  Delirium Tremens Worsening generalized tremor, agitation the setting of heavy alcohol use with some concern for early DTs Alcohol level in the 30s Will plan to start CIWA protocol As needed IV Ativan As needed IV phenobarbital-pharmacy consulted Multivitamin supplementation Monitor

## 2023-04-05 NOTE — H&P (Addendum)
 History and Physical    Patient: Lawrence Rowe KGU:[id number] DOB: 07-30-1970 DOA: 04/05/2023 DOS: the patient was seen and examined on 04/05/2023 PCP: Pcp, No  Patient coming from: Home  Chief Complaint:  Chief Complaint  Patient presents with   Weakness   Alcohol Problem   HPI: Lawrence Rowe is a 53 y.o. male with medical history significant of alcohol abuse, GERD, hypertension presenting with alcohol withdrawal, early DT.  Patient reports drinking a 12 pack of beer daily.  Has stopped drinking for roughly 12 to 24 hours.  Worsening tremor, malaise, weakness.  Mild nausea.  No chest pain.  No abdominal pain.  No diarrhea.  Denies any illicit drug use.  Denies any tobacco use.  Baseline history of hypertension.  Unclear about BP regimen. Presented to the ER afebrile, heart rate into the 110s, BP stable.  Satting well on room air.  White count 8, hemoglobin 12.1, platelets 117, creatinine 0.58, urinalysis stable.  T. bili 1.9.  AST 107, ALT 4 1.  Started on CIWA protocol Ativan as well as phenobarbital for DT treatment in the ER. Review of Systems: As mentioned in the history of present illness. All other systems reviewed and are negative. 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 His smoking use included cigarettes. He has never used smokeless tobacco. He reports current alcohol use of about 12.0 standard drinks of alcohol per week. He reports that he does not use drugs.  Allergies  Allergen Reactions   Penicillins Other (See Comments)    Family History  Problem Relation Age of Onset   Alcohol abuse Neg Hx     Prior to Admission medications   Medication Sig Start Date End  Date Taking? Authorizing Provider  amLODipine (NORVASC) 10 MG tablet Take 1 tablet (10 mg total) by mouth daily. 06/17/22   Wouk, Wilfred Curtis, MD  folic acid (FOLVITE) 1 MG tablet Take 1 tablet (1 mg total) by mouth daily. 06/16/22   Wouk, Wilfred Curtis, MD  losartan (COZAAR) 50 MG tablet Take 1 tablet (50 mg total) by mouth daily. 06/17/22   Wouk, Wilfred Curtis, MD  metoprolol succinate (TOPROL-XL) 50 MG 24 hr tablet Take 1 tablet (50 mg total) by mouth daily. Take with or immediately following a meal. 06/16/22   Wouk, Wilfred Curtis, MD  Multiple Vitamin (MULTIVITAMIN WITH MINERALS) TABS tablet Take 1 tablet by mouth daily. 12/28/22   Darlin Priestly, MD  omeprazole (PRILOSEC) 10 MG capsule Take 1 capsule (10 mg total) by mouth daily. 06/19/22   Cuthriell, Delorise Royals, PA-C  PHENObarbital (LUMINAL) 30 MG tablet Take 1 tablet (30 mg total) by mouth 3 (three) times daily for 2 days. 12/27/22 12/29/22  Darlin Priestly, MD  thiamine (VITAMIN B1) 100 MG tablet Take 1 tablet (100 mg total) by mouth daily. 06/16/22   Kathrynn Running, MD    Physical Exam: Vitals:   04/05/23 1220 04/05/23 1330 04/05/23 1438  BP: 133/80  (!) 163/88  Pulse: (!) 135  (!) 117  Resp: 20  20  Temp: 98.3 F (36.8 C)    TempSrc: Oral    SpO2: 96%  93%  Weight:  90.7 kg  Physical Exam Constitutional:      Appearance: He is obese.     Comments: Highly disheveled appearing  HENT:     Head: Normocephalic and atraumatic.     Nose: Nose normal.     Mouth/Throat:     Mouth: Mucous membranes are dry.  Eyes:     Pupils: Pupils are equal, round, and reactive to light.  Cardiovascular:     Rate and Rhythm: Normal rate and regular rhythm.  Pulmonary:     Effort: Pulmonary effort is normal.  Abdominal:     General: Bowel sounds are normal.  Skin:    Comments: Noted buttock rash.  Neurological:     General: No focal deficit present.  Psychiatric:        Mood and Affect: Mood normal.     Data Reviewed:  There are no new results to  review at this time.  DG Chest 2 View CLINICAL DATA:  Shortness of breath.  EXAM: CHEST - 2 VIEW  COMPARISON:  Jun 08, 2022.  FINDINGS: The heart size and mediastinal contours are within normal limits. Both lungs are clear. The visualized skeletal structures are unremarkable.  IMPRESSION: No active cardiopulmonary disease.  Electronically Signed   By: Lupita Raider M.D.   On: 12/22/2022 15:18  Lab Results  Component Value Date   WBC 7.9 04/05/2023   HGB 12.1 (L) 04/05/2023   HCT 36.0 (L) 04/05/2023   MCV 97.8 04/05/2023   PLT 117 (L) 04/05/2023   Last metabolic panel Lab Results  Component Value Date   GLUCOSE 131 (H) 04/05/2023   NA 138 04/05/2023   K 3.9 04/05/2023   CL 104 04/05/2023   CO2 20 (L) 04/05/2023   BUN 7 04/05/2023   CREATININE 0.58 (L) 04/05/2023   GFRNONAA >60 04/05/2023   CALCIUM 8.7 (L) 04/05/2023   PHOS 2.9 12/22/2022   PROT 7.7 04/05/2023   ALBUMIN 3.5 04/05/2023   BILITOT 1.9 (H) 04/05/2023   ALKPHOS 71 04/05/2023   AST 107 (H) 04/05/2023   ALT 41 04/05/2023   ANIONGAP 14 04/05/2023    Assessment and Plan: * Alcohol withdrawal (HCC) Alcohol abuse  Delirium Tremens Worsening generalized tremor, agitation the setting of heavy alcohol use with some concern for early DTs Alcohol level in the 30s Will plan to start CIWA protocol As needed IV Ativan As needed IV phenobarbital-pharmacy consulted Multivitamin supplementation Monitor    Chronic diastolic CHF (congestive heart failure) (HCC) 2D echo January 2024 with a EF of 55 to 60% and grade 1 diastolic dysfunction Appears fairly euvolemic to clinically dry Monitor volume status with hydration Follow  GERD (gastroesophageal reflux disease) PPI  Serum total bilirubin elevated T. bili 1.9 in the setting of alcohol use LFTs grossly stable Otherwise stable Monitor  Dermatitis + sacral redness concerning for contact dermatitis vs. Poor overall hygiene  Minimal  pruritis Wound care consulted  Monitor   Tachycardia with hypertension HR into 100s in setting of DTs  Resume home metoprolol  Follow        Advance Care Planning:   Code Status: Full Code   Consults: None   Family Communication: No family at the bedside   Severity of Illness: The appropriate patient status for this patient is OBSERVATION. Observation status is judged to be reasonable and necessary in order to provide the required intensity of service to ensure the patient's safety. The patient's presenting symptoms, physical exam findings, and initial radiographic and laboratory data in the context of their  medical condition is felt to place them at decreased risk for further clinical deterioration. Furthermore, it is anticipated that the patient will be medically stable for discharge from the hospital within 2 midnights of admission.   Author: Floydene Flock, MD 04/05/2023 4:45 PM  For on call review [email or web address].

## 2023-04-05 NOTE — ED Triage Notes (Signed)
 Pt is here for generalized weakness which he states began today.  Pt appears unkempt and admits to ETOH abuse, last drink last pm, he states that he does not usually drink in the am.  Pt appears shaky and is alert and oriented.

## 2023-04-05 NOTE — Assessment & Plan Note (Signed)
 T. bili 1.9 in the setting of alcohol use LFTs grossly stable Otherwise stable Monitor

## 2023-04-05 NOTE — Assessment & Plan Note (Signed)
 2D echo January 2024 with a EF of 55 to 60% and grade 1 diastolic dysfunction Appears fairly euvolemic to clinically dry Monitor volume status with hydration Follow

## 2023-04-05 NOTE — Assessment & Plan Note (Signed)
 PPI ?

## 2023-04-05 NOTE — ED Notes (Signed)
 Pt asked for a urine sample by this RN. Pt stated he does not have to go and stated he could not try at the moment.

## 2023-04-05 NOTE — ED Notes (Addendum)
 Pt cleaned up after bowel movement. New sheets, bed pads, and brief applied. Skin on buttocks noted to be very red, warm to the touch, slightly bleeding in some spots, and painful for the pt. MD [facility] notified and has seen it during his rounding.

## 2023-04-05 NOTE — Assessment & Plan Note (Signed)
 HR into 100s in setting of DTs  Resume home metoprolol  Follow

## 2023-04-06 DIAGNOSIS — F10939 Alcohol use, unspecified with withdrawal, unspecified: Secondary | ICD-10-CM | POA: Diagnosis present

## 2023-04-06 DIAGNOSIS — R569 Unspecified convulsions: Secondary | ICD-10-CM

## 2023-04-06 DIAGNOSIS — Z79899 Other long term (current) drug therapy: Secondary | ICD-10-CM | POA: Diagnosis not present

## 2023-04-06 DIAGNOSIS — L309 Dermatitis, unspecified: Secondary | ICD-10-CM | POA: Diagnosis present

## 2023-04-06 DIAGNOSIS — Z87891 Personal history of nicotine dependence: Secondary | ICD-10-CM | POA: Diagnosis not present

## 2023-04-06 DIAGNOSIS — Z800 Family history of malignant neoplasm of digestive organs: Secondary | ICD-10-CM | POA: Diagnosis not present

## 2023-04-06 DIAGNOSIS — F10231 Alcohol dependence with withdrawal delirium: Secondary | ICD-10-CM | POA: Diagnosis present

## 2023-04-06 DIAGNOSIS — F10931 Alcohol use, unspecified with withdrawal delirium: Secondary | ICD-10-CM | POA: Diagnosis not present

## 2023-04-06 DIAGNOSIS — I110 Hypertensive heart disease with heart failure: Secondary | ICD-10-CM | POA: Diagnosis present

## 2023-04-06 DIAGNOSIS — K219 Gastro-esophageal reflux disease without esophagitis: Secondary | ICD-10-CM | POA: Diagnosis present

## 2023-04-06 DIAGNOSIS — I5032 Chronic diastolic (congestive) heart failure: Secondary | ICD-10-CM | POA: Diagnosis present

## 2023-04-06 LAB — HIV ANTIBODY (ROUTINE TESTING W REFLEX): HIV Screen 4th Generation wRfx: NONREACTIVE

## 2023-04-06 LAB — COMPREHENSIVE METABOLIC PANEL WITH GFR
ALT: 30 U/L (ref 0–44)
AST: 74 U/L — ABNORMAL HIGH (ref 15–41)
Albumin: 2.7 g/dL — ABNORMAL LOW (ref 3.5–5.0)
Alkaline Phosphatase: 56 U/L (ref 38–126)
Anion gap: 9 (ref 5–15)
BUN: 10 mg/dL (ref 6–20)
CO2: 24 mmol/L (ref 22–32)
Calcium: 8.3 mg/dL — ABNORMAL LOW (ref 8.9–10.3)
Chloride: 105 mmol/L (ref 98–111)
Creatinine, Ser: 0.59 mg/dL — ABNORMAL LOW (ref 0.61–1.24)
GFR, Estimated: >60 mL/min (ref >60)
Glucose, Bld: 101 mg/dL — ABNORMAL HIGH (ref 70–99)
Potassium: 3.5 mmol/L (ref 3.5–5.1)
Sodium: 138 mmol/L (ref 135–145)
Total Bilirubin: 2.3 mg/dL — ABNORMAL HIGH (ref 0.0–1.2)
Total Protein: 6.2 g/dL — ABNORMAL LOW (ref 6.5–8.1)

## 2023-04-06 LAB — CBC
HCT: 31.9 % — ABNORMAL LOW (ref 39.0–52.0)
Hemoglobin: 10.6 g/dL — ABNORMAL LOW (ref 13.0–17.0)
MCH: 33.5 pg (ref 26.0–34.0)
MCHC: 33.2 g/dL (ref 30.0–36.0)
MCV: 100.9 fL — ABNORMAL HIGH (ref 80.0–100.0)
Platelets: 87 10*3/uL — ABNORMAL LOW (ref 150–400)
RBC: 3.16 MIL/uL — ABNORMAL LOW (ref 4.22–5.81)
RDW: 17.5 % — ABNORMAL HIGH (ref 11.5–15.5)
WBC: 5.5 10*3/uL (ref 4.0–10.5)
nRBC: 0 % (ref 0.0–0.2)

## 2023-04-06 MED ORDER — GERHARDT'S BUTT CREAM
TOPICAL_CREAM | Freq: Three times a day (TID) | CUTANEOUS | Status: DC
  Filled 2023-04-06: qty 60

## 2023-04-06 NOTE — TOC Initial Note (Signed)
 Transition of Care [facility]) - Initial/Assessment Note    Patient Details  Name: Lawrence Rowe MRN: [id number] Date of Birth: May 05, 1970  Transition of [facility] CM/SW Contact:    Elberta Fortis, RN Phone Number: 04/06/2023, 10:08 AM  Clinical Narrative:  Met with pt in his hospital room. He lives in a 3 story home with his SO, who he wishes wasn't living there. He states usually gets up and down the stairs without difficulty. He has a walker from a past hospitalization but doesn't require it at this time. He denies needing or using any other DME. He doesn't have a PCP and is ok if we find him one. Discussed giving resources for ETOH TX at DC and he approves of this. He states has wounds to his bottom and has a wound nurse consult, and is receiving wound care TID. TOC to continue to follow.                  Expected Discharge Plan: Home w Home Health Services Barriers to Discharge: Continued Medical Work up   Patient Goals and CMS Choice            Expected Discharge Plan and Services   Discharge Planning Services: CM Consult   Living arrangements for the past 2 months: Single Family Home                 DME Arranged: Environmental consultant                    Prior Living Arrangements/Services Living arrangements for the past 2 months: Single Family Home Lives with:: Self Patient language and need for interpreter reviewed:: Yes Do you feel safe going back to the place where you live?: Yes      Need for Family Participation in Patient Care: Yes (Comment) Care giver support system in place?: No (comment) Current home services:  (none currently) Criminal Activity/Legal Involvement Pertinent to Current Situation/Hospitalization: No - Comment as needed  Activities of Daily Living      Permission Sought/Granted                  Emotional Assessment Appearance:: Appears older than stated age, Disheveled Attitude/Demeanor/Rapport: Gracious Affect (typically  observed): Accepting, Hopeful, Pleasant, Calm Orientation: : Oriented to Self, Oriented to Place, Oriented to  Time, Oriented to Situation Alcohol / Substance Use: Alcohol Use (Resources will be provided in AVS.) Psych Involvement: No (comment)  Admission diagnosis:  Alcohol withdrawal (HCC) [F10.939] Patient Active Problem List   Diagnosis Date Noted   Elevated LFTs 12/22/2022   Serum total bilirubin elevated 12/22/2022   GERD (gastroesophageal reflux disease) 12/22/2022   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   Lactic acidosis 06/14/2022   Alcohol withdrawal with inpatient treatment (HCC) 06/08/2022   High anion gap metabolic acidosis 06/08/2022   Chronic diastolic CHF (congestive heart failure) (HCC) 01/13/2022   Overweight (BMI 25.0-29.9) 01/12/2022   Fatty liver 01/12/2022   Abnormal liver function 01/11/2022   Wernicke encephalopathy syndrome 12/27/2021   Delirium tremens (HCC) 12/27/2021   Alcoholic hepatitis 12/27/2021   Alcohol withdrawal (HCC) 12/23/2021   Transaminitis 12/23/2021   Unilateral edema of lower extremity 12/23/2021   Dermatitis 05/10/2021   Primary hypertension 05/10/2021   Tachycardia with hypertension 04/19/2021   PAD (peripheral artery disease) (HCC) 04/19/2021   Alcohol abuse 04/19/2021   Metabolic syndrome 04/19/2021   PCP:  Pcp, No Pharmacy:   [facility] - [place], [place] - [address] [place] [address] Phone: [phone] Fax: [phone]  [facility] [address] [place] [address] Phone: [phone] Fax: [phone]     Social Drivers of Health (SDOH) Social History: SDOH Screenings   Food Insecurity: No Food Insecurity (12/23/2022)  Housing: High Risk (12/24/2022)  Transportation Needs: No Transportation Needs (12/23/2022)  Utilities: At Risk (12/24/2022)  Depression (PHQ2-9): Low Risk  (04/19/2021)  Tobacco Use: Medium Risk (04/05/2023)   SDOH  Interventions:     Readmission Risk Interventions     No data to display

## 2023-04-06 NOTE — Progress Notes (Signed)
 Progress Note   Patient: Lawrence Rowe ZOX:[id number] DOB: 05-11-1970 DOA: 04/05/2023     0 DOS: the patient was seen and examined on 04/06/2023   Brief hospital course: From HPI "NYSHAWN GOWDY is a 53 y.o. male with medical history significant of alcohol abuse, GERD, hypertension presenting with alcohol withdrawal, early DT.  Patient reports drinking a 12 pack of beer daily.  Has stopped drinking for roughly 12 to 24 hours.  Worsening tremor, malaise, weakness.  Mild nausea.  No chest pain.  No abdominal pain.  No diarrhea.  Denies any illicit drug use.  Denies any tobacco use.  Baseline history of hypertension.  Unclear about BP regimen. Presented to the ER afebrile, heart rate into the 110s, BP stable.  Satting well on room air.  White count 8, hemoglobin 12.1, platelets 117, creatinine 0.58, urinalysis stable.  T. bili 1.9.  AST 107, ALT 4 1.  Started on CIWA protocol Ativan as well as phenobarbital for DT treatment in the ER.  "  Assessment and Plan:  Alcohol withdrawal (HCC) Alcohol abuse  Delirium Tremens Worsening generalized tremor, agitation the setting of heavy alcohol use with some concern for early DTs Alcohol level in the 30s Continue CIWA protocol Continue as needed IV Ativan Continue as needed IV phenobarbital-pharmacy consulted Continue multivitamin supplementation    Chronic diastolic CHF (congestive heart failure) (HCC) 2D echo January 2024 with a EF of 55 to 60% and grade 1 diastolic dysfunction Appears fairly euvolemic to clinically dry Monitor volume status with hydration Monitor daily weight   GERD (gastroesophageal reflux disease) Continue PPI therapy   Serum total bilirubin elevated T. bili 1.9 in the setting of alcohol use LFTs grossly stable Otherwise stable    Dermatitis + sacral redness concerning for contact dermatitis vs. Poor overall hygiene  Minimal pruritis Wound care consulted     Tachycardia with hypertension HR into 100s  in setting of DTs  Resume home metoprolol    Advance Care Planning:   Code Status: Full Code    Consults: None    Family Communication: No family at the bedside     Subjective:  Patient seen and examined at bedside this morning Having upper extremity tremor most Denies nausea vomiting abdominal pain or chest pain  Physical Exam:  Appearance: He is obese looking HENT:     Head: Normocephalic and atraumatic.     Nose: Nose normal.  Eyes:     Pupils: Pupils are equal, round, and reactive to light.  Cardiovascular:     Rate and Rhythm: Normal rate and regular rhythm.  Pulmonary:     Effort: Pulmonary effort is normal.  Abdominal:     General: Bowel sounds are normal.  Skin:    Comments: Noted buttock rash.  Neurological:     General: No focal deficit present.  Psychiatric:        Mood and Affect: Mood normal.  Vitals:   04/06/23 0944 04/06/23 1100 04/06/23 1200 04/06/23 1217  BP: (!) 147/99 (!) 161/90 (!) 142/83   Pulse: 78 78 74   Resp:  14    Temp:    98.2 F (36.8 C)  TempSrc:    Oral  SpO2:  98%    Weight:        Data Reviewed:    Latest Ref Rng & Units 04/06/2023    4:51 AM 04/05/2023   12:29 PM 12/24/2022    4:51 AM  CBC  WBC 4.0 - 10.5 K/uL 5.5  7.9  6.5   Hemoglobin 13.0 - 17.0 g/dL 16.1  09.6  04.5   Hematocrit 39.0 - 52.0 % 31.9  36.0  36.0   Platelets 150 - 400 K/uL 87  117  105        Latest Ref Rng & Units 04/06/2023    4:51 AM 04/05/2023   12:29 PM 12/27/2022    5:03 AM  BMP  Glucose 70 - 99 mg/dL 409  811  914   BUN 6 - 20 mg/dL 10  7  10    Creatinine 0.61 - 1.24 mg/dL 7.82  9.56  2.13   Sodium 135 - 145 mmol/L 138  138  136   Potassium 3.5 - 5.1 mmol/L 3.5  3.9  3.4   Chloride 98 - 111 mmol/L 105  104  103   CO2 22 - 32 mmol/L 24  20  24    Calcium 8.9 - 10.3 mg/dL 8.3  8.7  8.4      Family Communication: None at bedside  Disposition: Status is: Inpatient   Time spent: 56 minutes  Author: Loyce Dys, MD 04/06/2023 3:31  PM  For on call review [email or web address].

## 2023-04-06 NOTE — Evaluation (Signed)
 Occupational Therapy Evaluation Patient Details Name: Lawrence Rowe MRN: [id number] DOB: 12-03-1970 Today's Date: 04/06/2023   History of Present Illness  Lawrence Rowe is a 53yoM who comes to [facility] on 04/06/23 with acute onset worsening feet cramping pain, generalized weakness, unkempt appearance. Reports unable to care for self, has no electricity at home.   Clinical Impressions Pt was seen for OT evaluation this date. Prior to hospital admission, pt was living in 2 story home with a basement with his spouse. Pt presents to acute OT demonstrating impaired ADL performance and functional mobility 2/2 decreased balance, strength, activity tolerance, and decreased safety awareness (See OT problem list for additional functional deficits). Pt currently requires CGA-MIN A for LB ADL and CGA with RW for ADL transfers and taking a few steps forward, backward, and side to side. Pt then endorsing he is too fatigued to do more.  Pt would benefit from skilled OT services to address noted impairments and functional limitations (see below for any additional details) in order to maximize safety and independence while minimizing falls risk and caregiver burden.    If plan is discharge home, recommend the following:   A little help with walking and/or transfers;A little help with bathing/dressing/bathroom;Assistance with cooking/housework;Assist for transportation;Help with stairs or ramp for entrance;Direct supervision/assist for medications management     Functional Status Assessment   Patient has had a recent decline in their functional status and demonstrates the ability to make significant improvements in function in a reasonable and predictable amount of time.     Equipment Recommendations   None recommended by OT      Precautions/Restrictions   Precautions Precautions: Fall Restrictions Weight Bearing Restrictions Per Provider Order: No     Mobility Bed Mobility Overal bed  mobility: Modified Independent     Transfers Overall transfer level: Needs assistance Equipment used: Rolling walker (2 wheels) Transfers: Sit to/from Stand Sit to Stand: Contact guard assist    General transfer comment: unsteady, slightly impulsive      Balance Overall balance assessment: Needs assistance Sitting-balance support: No upper extremity supported, Feet supported Sitting balance-Leahy Scale: Fair Sitting balance - Comments: fair static while using urinal seated EOB   Standing balance support: Reliant on assistive device for balance, During functional activity, Bilateral upper extremity supported Standing balance-Leahy Scale: Poor Standing balance comment: poor dynamic balance requiring BUE support     ADL either performed or assessed with clinical judgement   ADL Overall ADL's : Needs assistance/impaired    General ADL Comments: Pt required increased time/effort and supv to don socks seated EOB, CGA for ADL transfers, VC for safety during session      Pertinent Vitals/Pain Pain Assessment Pain Assessment: 0-10 Pain Score: 3  Pain Location: buttocks Pain Descriptors / Indicators: Guarding Pain Intervention(s): Limited activity within patient's tolerance, Monitored during session, Premedicated before session, Repositioned     Extremity/Trunk Assessment Upper Extremity Assessment Upper Extremity Assessment: Generalized weakness   Lower Extremity Assessment Lower Extremity Assessment: Generalized weakness       Communication Communication Communication: No apparent difficulties   Cognition Arousal: Alert Behavior During Therapy: WFL for tasks assessed/performed Cognition: No family/caregiver present to determine baseline      OT - Cognition Comments: alert and oriented, slow processing, questionable safety awareness    Following commands: Intact       Cueing  General Comments   Cueing Techniques: Verbal cues      Exercises Other  Exercises Other Exercises: Pt educated in falls prevention, home/routines  modifications to promote safety and minimize over exertion        Home Living Family/patient expects to be discharged to:: Private residence Living Arrangements: Alone Available Help at Discharge: Family;Available PRN/intermittently (sister, mother) Type of Home: House Home Access: Stairs to enter [employer] of Steps: 3-4 without railing afront; enters at back where there are 3-4 steps with somthing to hold onto Entrance Stairs-Rails: None Home Layout: Multi-level;Laundry or work area in basement;Bed/bath upstairs (2 story + basement) Alternate Level Stairs-Number of Steps: 14 Alternate Level Stairs-Rails: Left Bathroom Shower/Tub: Chief Strategy Officer: Standard     Home Equipment: Agricultural consultant (2 wheels)          Prior Functioning/Environment Prior Level of Function : Independent/Modified Independent;Driving;Patient poor historian/Family not available             Mobility Comments: Indep with ADLs, household and community mobilization without RW; ADLs Comments: indep, Benedetto Goad eats mostly, "haphazard with my BP meds"    OT Problem List: Decreased strength;Decreased activity tolerance;Decreased safety awareness;Impaired balance (sitting and/or standing);Decreased knowledge of use of DME or AE   OT Treatment/Interventions: Self-care/ADL training;Therapeutic exercise;Therapeutic activities;DME and/or AE instruction;Patient/family education;Balance training;Energy conservation      OT Goals(Current goals can be found in the care plan section)   Acute Rehab OT Goals Patient Stated Goal: go home OT Goal Formulation: With patient Time For Goal Achievement: 04/20/23 Potential to Achieve Goals: Good ADL Goals Pt Will Perform Lower Body Dressing: with modified independence;sit to/from stand Pt Will Transfer to Toilet: with modified independence;ambulating (LRAD) Pt Will  Perform Toileting - Clothing Manipulation and hygiene: with modified independence Additional ADL Goal #1: Pt will complete all aspects of showering wiht modified indep, 1/1 opportunity. Additional ADL Goal #2: Pt will verablize plan to implement at least 1 learned falls prevention strategy.   OT Frequency:  Min 1X/week       AM-PAC OT "6 Clicks" Daily Activity     Outcome Measure Help from another person eating meals?: None Help from another person taking care of personal grooming?: None Help from another person toileting, which includes using toliet, bedpan, or urinal?: A Little Help from another person bathing (including washing, rinsing, drying)?: A Little Help from another person to put on and taking off regular upper body clothing?: None Help from another person to put on and taking off regular lower body clothing?: A Little 6 Click Score: 21   End of Session Equipment Utilized During Treatment: Rolling walker (2 wheels);Gait belt Nurse Communication: Mobility status  Activity Tolerance: Patient tolerated treatment well Patient left: in bed;with call bell/phone within reach  OT Visit Diagnosis: Unsteadiness on feet (R26.81);Muscle weakness (generalized) (M62.81)                Time: 8295-6213 OT Time Calculation (min): 20 min Charges:  OT General Charges $OT Visit: 1 Visit OT Evaluation $OT Eval Low Complexity: 1 Low  Arman Filter., MPH, MS, OTR/L ascom [phone] 04/06/23, 2:50 PM

## 2023-04-06 NOTE — Consult Note (Signed)
 WOC Nurse Consult Note: Reason for Consult: rash to buttocks  Wound type: Moisture Associated Skin Damage  Pressure Injury POA: NA  Measurement: widespread to buttocks and posterior thighs  Wound bed: erythema with scattered partial thickness skin loss  Drainage (amount, consistency, odor) bleeding in areas per notes  Periwound: erythema  Dressing procedure/placement/frequency: Cleanse bilateral buttocks/posterior thighs with Vashe wound cleanser Hart [place] [phone]), do not rinse and allow to air dry. Apply Gerhardt's Butt Cream 3 times a day and as needed for soiling.    POC discussed with bedside nurse. WOC team will not follow. Re-consult if further needs arise.   Thank you,    Priscella Mann MSN, RN-BC, [employer] [phone]

## 2023-04-06 NOTE — Evaluation (Addendum)
 Physical Therapy Evaluation Patient Details Name: DAREY HERSHBERGER MRN: [id number] DOB: 01/11/1970 Today's Date: 04/06/2023  History of Present Illness  Lawrence Rowe is a 53yoM who comes to [facility] on 04/06/23 with acute onset worsening feet cramping pain, generalized weakness, unkempt appearance. Reports unable to care for self, has no electricity at home.  Clinical Impression  Pt in bed on arrival, agreeable to session. Pt doesn't offer much subjective reports on my visit, but does endorse feet feeling somewhat painful and red. Author notes raised portions of skin on feet, ankles, forelegs, all somewhat erythematous, akin to rash, some circular and coin sized, other large irregular areas. Noted significant pitting edema on both anterior compartments. Pt also has significant redness on the backside of his hips/pelvis, much more erythmatous, pt says only mildly painful. Pt able to come to standing without assist, remain on feet unsupported to urinate, user error with urinal suspect for some cognitive deficits. Pt reports unable to remain upright much longer, not able to attempt AMB. Pt unable to doff his wet socks due to weakness/pain. No assist needed back to bed. Will continue to follow.       If plan is discharge home, recommend the following: A little help with walking and/or transfers;A little help with bathing/dressing/bathroom;Direct supervision/assist for medications management;Supervision due to cognitive status   Can travel by private vehicle        Equipment Recommendations None recommended by PT  Recommendations for Other Services       Functional Status Assessment Patient has had a recent decline in their functional status and demonstrates the ability to make significant improvements in function in a reasonable and predictable amount of time.     Precautions / Restrictions Precautions Precautions: Fall Restrictions Weight Bearing Restrictions Per Provider Order: No       Mobility  Bed Mobility Overal bed mobility: Modified Independent                  Transfers Overall transfer level: Needs assistance Equipment used: None Transfers: Sit to/from Stand Sit to Stand: Supervision           General transfer comment: appears weak, but is able to balance x2-3 minutes while urinating. Pt unable to remain standing much longer.    Ambulation/Gait Ambulation/Gait assistance:  (deferred due to poor standing tolerance.)                Stairs            Wheelchair Mobility     Tilt Bed    Modified Rankin (Stroke Patients Only)       Balance                                             Pertinent Vitals/Pain      Home Living Family/patient expects to be discharged to:: Private residence Living Arrangements: Alone Available Help at Discharge: Family;Available PRN/intermittently (sister, mother) Type of Home: House Home Access: Stairs to enter Entrance Stairs-Rails: None Entrance Stairs-Number of Steps: 3-4 without railing afront; enters at back where there are 3-4 steps with somthing to hold onto Alternate Level Stairs-Number of Steps: 14 Home Layout: Multi-level;Laundry or work area in basement;Bed/bath upstairs (2 story + basement) Home Equipment: Agricultural consultant (2 wheels)      Prior Function Prior Level of Function : Independent/Modified Independent;Driving;Patient poor historian/Family not available  Mobility Comments: Indep with ADLs, household and community mobilization without RW; ADLs Comments: indep, Benedetto Goad eats mostly, "haphazard with my BP meds"     Extremity/Trunk Assessment                Communication   Communication Communication: No apparent difficulties    Cognition Arousal: Alert Behavior During Therapy: WFL for tasks assessed/performed   PT - Cognitive impairments: Difficult to assess                       PT - Cognition Comments: tells TOC  he lives with SO, but tells OT he lives alone; today did not take top off urinal and peed onto it 3 times.         Cueing       General Comments      Exercises     Assessment/Plan    PT Assessment Patient needs continued PT services  PT Problem List Decreased strength;Decreased range of motion;Decreased activity tolerance;Decreased balance;Decreased mobility;Decreased coordination;Decreased safety awareness       PT Treatment Interventions DME instruction;Gait training;Stair training;Functional mobility training;Therapeutic activities;Therapeutic exercise;Balance training;Patient/family education;Neuromuscular re-education    PT Goals (Current goals can be found in the Care Plan section)  Acute Rehab PT Goals PT Goal Formulation: Patient unable to participate in goal setting    Frequency Min 2X/week     Co-evaluation               AM-PAC PT "6 Clicks" Mobility  Outcome Measure Help needed turning from your back to your side while in a flat bed without using bedrails?: A Little Help needed moving from lying on your back to sitting on the side of a flat bed without using bedrails?: A Little Help needed moving to and from a bed to a chair (including a wheelchair)?: A Little Help needed standing up from a chair using your arms (e.g., wheelchair or bedside chair)?: A Little Help needed to walk in hospital room?: A Little Help needed climbing 3-5 steps with a railing? : A Little 6 Click Score: 18    End of Session Equipment Utilized During Treatment: Gait belt Activity Tolerance: Patient tolerated treatment well;No increased pain Patient left: in bed;with call bell/phone within reach Nurse Communication: Mobility status PT Visit Diagnosis: Difficulty in walking, not elsewhere classified (R26.2);Unsteadiness on feet (R26.81);Other abnormalities of gait and mobility (R26.89)    Time: 5409-8119 PT Time Calculation (min) (ACUTE ONLY): 27 min   Charges:   PT  Evaluation $PT Eval Moderate Complexity: 1 Mod PT Treatments $Therapeutic Activity: 8-22 mins PT General Charges $$ ACUTE PT VISIT: 1 Visit        2:40 PM, 04/06/23 Rosamaria Lints, PT, DPT Physical Therapist - [facility]  [phone] (ASCOM)    Arina Torry C 04/06/2023, 2:35 PM

## 2023-04-07 DIAGNOSIS — F10939 Alcohol use, unspecified with withdrawal, unspecified: Secondary | ICD-10-CM

## 2023-04-07 DIAGNOSIS — R569 Unspecified convulsions: Secondary | ICD-10-CM | POA: Diagnosis not present

## 2023-04-07 MED ORDER — FOLIC ACID 1 MG PO TABS: 1.0000 mg | ORAL_TABLET | Freq: Every day | ORAL | 1 refills | Status: DC

## 2023-04-07 MED ORDER — OMEPRAZOLE 10 MG PO CPDR: 10.0000 mg | DELAYED_RELEASE_CAPSULE | Freq: Every day | ORAL | 3 refills | Status: DC

## 2023-04-07 MED ORDER — THIAMINE HCL 100 MG PO TABS: 100.0000 mg | ORAL_TABLET | Freq: Every day | ORAL | 1 refills | Status: DC

## 2023-04-07 NOTE — TOC Progression Note (Addendum)
 Transition of Care [facility]) - Progression Note    Patient Details  Name: Lawrence Rowe MRN: [id number] Date of Birth: 11/30/1970  [facility] of [facility]) CM/SW Contact  Bing Quarry, RN Phone Number: 04/07/2023, 11:09 AM  Clinical Narrative: 3/29: Per provider, potential discharge to home today with home health. Patient had Medicaid via [place], and [place] [employer] so unable to get Medicaid today via know agencies. Patient also noted not to have electricity at home. RN CM will try to contact [employer] for Case Manager information. Lives at home with significant other per prior CM and PT notes.  PT evaluation notes from 04/06/23 indicated poor balance issues and "Ambulation/Gait assistance: (deferred due to poor standing tolerance.)". He has stairs at home to navigate. Wound consult canceled per provider. ETOH resources were given to patient via AVS and RN CM spoke with patient on 04/06/23 regarding this issues.  Spoke with patient regarding home situation and any knowledge of a case Production designer, theatre/television/film with Medicaid. Requested information be sent via [employer] email to [email or web address]. Asked if he could go to outpatient therapy instead of home health, and he said he would work it out but he probably could. Choices offered and chose [facility] outpatient rehab campus. Progress note/order for OP therapy placed. Provider updated. 1145 am.   Gabriel Cirri MSN RN CM  RN Case Manager [place]  Transitions of Care Direct Dial: [phone] (Weekends Only) [facility] Main Office Phone: [phone] [facility] Fax: [phone] [email or web address]     Expected Discharge Plan: Home w Home Health Services Barriers to Discharge: Continued Medical Work up  Expected Discharge Plan and Services   Discharge Planning Services: CM Consult   Living arrangements for the past 2 months: Single Family Home Expected Discharge Date: 04/07/23               DME Arranged: Dan Humphreys                     Social Determinants of Health  (SDOH) Interventions SDOH Screenings   Food Insecurity: No Food Insecurity (04/07/2023)  Housing: High Risk (04/07/2023)  Transportation Needs: No Transportation Needs (04/07/2023)  Utilities: At Risk (04/07/2023)  Depression (PHQ2-9): Low Risk  (04/19/2021)  Tobacco Use: Medium Risk (04/05/2023)    Readmission Risk Interventions     No data to display

## 2023-04-07 NOTE — Plan of Care (Signed)
 Marland Kitchen

## 2023-04-07 NOTE — Progress Notes (Signed)
 Discharge instructions and med details reviewed with patient and mother. Both verbalized understanding. All questions answered.  IV removed. Printed AVS given to patient. Patient escorted out via wheelchair.

## 2023-04-07 NOTE — Progress Notes (Signed)
 Occupational Therapy * Physical Therapy * Speech Therapy  DATE: 04/07/23  PATIENT NAME: Lawrence Rowe PATIENT MRN: [id number]  DIAGNOSIS/DIAGNOSIS CODE: F10.939 DATE OF DISCHARGE: 04/07/23  PRIMARY CARE PHYSICIAN: NA Laurena Bering Tailored Plan and [place] [employer] Medicaid) PCP PHONE/FAX   Dear Provider: [facility] outpatient rehab services.    I certify that I have examined this patient and that occupational/physical/speech therapy is necessary on an outpatient basis.    The patient has expressed interest in completing their recommended course of therapy at your location.  Once a formal order from the patient's primary care physician has been obtained, please contact him/her to schedule an appointment for evaluation at your earliest convenience.  [ X ]  Physical Therapy Evaluate and Treat  [ X]  Occupational Therapy Evaluate and Treat  [  ]  Speech Therapy Evaluate and Treat  The patient's primary care physician (listed above) must furnish and be responsible for a formal order such that the recommended services may be furnished while under the primary physician's care, and that the plan of care will be established and reviewed every 30 days (or more often if condition necessitates).

## 2023-04-07 NOTE — Discharge Summary (Signed)
 Physician Discharge Summary   Patient: Lawrence Rowe MRN: [id number] DOB: 08-10-1970  Admit date:     04/05/2023  Discharge date: 04/07/23  Discharge Physician: Loyce Dys   PCP: Pcp, No   Recommendations at discharge:  Follow-up with PT OT  Discharge Diagnoses:  Alcohol withdrawal (HCC) Alcohol abuse  Delirium Tremens-resolved Chronic diastolic CHF (congestive heart failure) (HCC) GERD (gastroesophageal reflux disease) Serum total bilirubin elevated Dermatitis Tachycardia with hypertension  Hospital Course: Lawrence Rowe is a 52 y.o. male with medical history significant of alcohol abuse, GERD, hypertension presenting with alcohol withdrawal, early DT.  Patient reports drinking a 12 pack of beer daily.  Has stopped drinking for roughly 12 to 24 hours.  Worsening tremor, malaise, weakness.  Mild nausea.  No chest pain.  No abdominal pain.  No diarrhea.  Denies any illicit drug use.  Denies any tobacco use.  Baseline history of hypertension.  Unclear about BP regimen. Presented to the ER afebrile, heart rate into the 110s, BP stable.  Satting well on room air.  White count 8, hemoglobin 12.1, platelets 117, creatinine 0.58, urinalysis stable.  T. bili 1.9.  AST 107, ALT 4 1.  Started on CIWA protocol Ativan as well as phenobarbital for DT treatment in the ER.  Alcohol withdrawal symptoms improved and patient was able to work with physical therapist with recommendation for discharge home with home health.   Consultants: None Procedures performed: None Disposition: Home health Diet recommendation:  Cardiac diet DISCHARGE MEDICATION: Allergies as of 04/07/2023       Reactions   Penicillins Other (See Comments)        Medication List     STOP taking these medications    amLODipine 10 MG tablet Commonly known as: NORVASC       TAKE these medications    folic acid 1 MG tablet Commonly known as: FOLVITE Take 1 tablet (1 mg total) by mouth daily.    losartan 50 MG tablet Commonly known as: COZAAR Take 1 tablet (50 mg total) by mouth daily.   metoprolol succinate 50 MG 24 hr tablet Commonly known as: TOPROL-XL Take 1 tablet (50 mg total) by mouth daily. Take with or immediately following a meal.   omeprazole 10 MG capsule Commonly known as: PRILOSEC Take 1 capsule (10 mg total) by mouth daily.   thiamine 100 MG tablet Commonly known as: VITAMIN B1 Take 1 tablet (100 mg total) by mouth daily.        Discharge Exam: Filed Weights   04/05/23 1330  Weight: 90.7 kg   Appearance: He is obese looking HENT:     Head: Normocephalic and atraumatic.     Nose: Nose normal.  Eyes:     Pupils: Pupils are equal, round, and reactive to light.  Cardiovascular:     Rate and Rhythm: Normal rate and regular rhythm.  Pulmonary:     Effort: Pulmonary effort is normal.  Abdominal:     General: Bowel sounds are normal.  Skin: Rash in the buttocks area Neurological:     General: No focal deficit present.  Psychiatric:        Mood and Affect: Mood normal.  Condition at discharge: good  The results of significant diagnostics from this hospitalization (including imaging, microbiology, ancillary and laboratory) are listed below for reference.   Imaging Studies: No results found.  Microbiology: Results for orders placed or performed during the hospital encounter of 09/25/21  SARS Coronavirus 2 by RT PCR (hospital order,  performed in [facility] hospital lab) *cepheid single result test* Anterior Nasal Swab     Status: None   Collection Time: 09/25/21 11:43 AM   Specimen: Anterior Nasal Swab  Result Value Ref Range Status   SARS Coronavirus 2 by RT PCR NEGATIVE NEGATIVE Final    Comment: (NOTE) SARS-CoV-2 target nucleic acids are NOT DETECTED.  The SARS-CoV-2 RNA is generally detectable in upper and lower respiratory specimens during the acute phase of infection. The lowest concentration of SARS-CoV-2 viral copies this assay can  detect is 250 copies / mL. A negative result does not preclude SARS-CoV-2 infection and should not be used as the sole basis for treatment or other patient management decisions.  A negative result may occur with improper specimen collection / handling, submission of specimen other than nasopharyngeal swab, presence of viral mutation(s) within the areas targeted by this assay, and inadequate number of viral copies (<250 copies / mL). A negative result must be combined with clinical observations, patient history, and epidemiological information.  Fact Sheet for Patients:   RoadLapTop.co.za  Fact Sheet for Healthcare Providers: http://kim-miller.com/  This test is not yet approved or  cleared by the [place] FDA and has been authorized for detection and/or diagnosis of SARS-CoV-2 by FDA under an Emergency Use Authorization (EUA).  This EUA will remain in effect (meaning this test can be used) for the duration of the COVID-19 declaration under Section 564(b)(1) of the Act, 21 U.S.C. section 360bbb-3(b)(1), unless the authorization is terminated or revoked sooner.  Performed at [facility], [address] [place], [place] [address]     Labs: CBC: Recent Labs  Lab 04/05/23 1229 04/06/23 0451  WBC 7.9 5.5  HGB 12.1* 10.6*  HCT 36.0* 31.9*  MCV 97.8 100.9*  PLT 117* 87*   Basic Metabolic Panel: Recent Labs  Lab 04/05/23 1229 04/06/23 0451  NA 138 138  K 3.9 3.5  CL 104 105  CO2 20* 24  GLUCOSE 131* 101*  BUN 7 10  CREATININE 0.58* 0.59*  CALCIUM 8.7* 8.3*  MG 1.8  --    Liver Function Tests: Recent Labs  Lab 04/05/23 1229 04/06/23 0451  AST 107* 74*  ALT 41 30  ALKPHOS 71 56  BILITOT 1.9* 2.3*  PROT 7.7 6.2*  ALBUMIN 3.5 2.7*   CBG: Recent Labs  Lab 04/05/23 1347  GLUCAP 128*    Discharge time spent:  .  Signed: Loyce Dys, MD [facility] 04/07/2023

## 2023-04-07 NOTE — Discharge Instructions (Signed)
 [place]:   Need help understanding Benefits and Services? Call us at [phone] (TTY: [phone]).    Or to obtain and Primary Care provider or a Case Manager.   All services must be medically necessary.  Your Primary Care Doctor/Provider will work with you to make sure you get the care and services you need.  These services must be given by your Primary Care Provider/Doctor or by another provider that your Primary Care Provider/Doctor refers you to.  Some services may: Have copays Have coverage limits Need a provider's/doctor's order Need prior approval  [employer] Medicaid  Finding care can be confusing sometimes.  At [facility], we make it easier. T The process starts with a phone call to our Member and Recipient Service Line at [phone].

## 2023-07-20 ENCOUNTER — Emergency Department: Payer: MEDICAID

## 2023-07-20 ENCOUNTER — Inpatient Hospital Stay
Admission: EM | Admit: 2023-07-20 | Discharge: 2023-07-25 | DRG: 897 | Disposition: A | Discharge status: Home / Self Care | Payer: MEDICAID | Attending: Internal Medicine | Admitting: Internal Medicine

## 2023-07-20 ENCOUNTER — Other Ambulatory Visit: Payer: Self-pay

## 2023-07-20 DIAGNOSIS — R21 Rash and other nonspecific skin eruption: Secondary | ICD-10-CM | POA: Diagnosis present

## 2023-07-20 DIAGNOSIS — F10231 Alcohol dependence with withdrawal delirium: Secondary | ICD-10-CM | POA: Diagnosis present

## 2023-07-20 DIAGNOSIS — F10931 Alcohol use, unspecified with withdrawal delirium: Secondary | ICD-10-CM

## 2023-07-20 DIAGNOSIS — F10939 Alcohol use, unspecified with withdrawal, unspecified: Principal | ICD-10-CM

## 2023-07-20 DIAGNOSIS — K219 Gastro-esophageal reflux disease without esophagitis: Secondary | ICD-10-CM | POA: Diagnosis present

## 2023-07-20 DIAGNOSIS — I10 Essential (primary) hypertension: Secondary | ICD-10-CM | POA: Diagnosis present

## 2023-07-20 DIAGNOSIS — Z1152 Encounter for screening for COVID-19: Secondary | ICD-10-CM | POA: Diagnosis not present

## 2023-07-20 DIAGNOSIS — Z91148 Patient's other noncompliance with medication regimen for other reason: Secondary | ICD-10-CM | POA: Diagnosis not present

## 2023-07-20 DIAGNOSIS — Z87891 Personal history of nicotine dependence: Secondary | ICD-10-CM | POA: Diagnosis not present

## 2023-07-20 DIAGNOSIS — D649 Anemia, unspecified: Secondary | ICD-10-CM | POA: Diagnosis present

## 2023-07-20 DIAGNOSIS — K7030 Alcoholic cirrhosis of liver without ascites: Secondary | ICD-10-CM | POA: Diagnosis present

## 2023-07-20 DIAGNOSIS — I959 Hypotension, unspecified: Secondary | ICD-10-CM | POA: Diagnosis present

## 2023-07-20 DIAGNOSIS — F10280 Alcohol dependence with alcohol-induced anxiety disorder: Secondary | ICD-10-CM | POA: Diagnosis present

## 2023-07-20 DIAGNOSIS — Z79899 Other long term (current) drug therapy: Secondary | ICD-10-CM

## 2023-07-20 DIAGNOSIS — D6959 Other secondary thrombocytopenia: Secondary | ICD-10-CM | POA: Diagnosis present

## 2023-07-20 DIAGNOSIS — T465X6A Underdosing of other antihypertensive drugs, initial encounter: Secondary | ICD-10-CM | POA: Diagnosis present

## 2023-07-20 LAB — CBC WITH DIFFERENTIAL/PLATELET
Abs Immature Granulocytes: 0.01 K/uL (ref 0.00–0.07)
Basophils Absolute: 0.1 K/uL (ref 0.0–0.1)
Basophils Relative: 1 %
Eosinophils Absolute: 0 K/uL (ref 0.0–0.5)
Eosinophils Relative: 0 %
HCT: 36.3 % — ABNORMAL LOW (ref 39.0–52.0)
Hemoglobin: 12.1 g/dL — ABNORMAL LOW (ref 13.0–17.0)
Immature Granulocytes: 0 %
Lymphocytes Relative: 7 %
Lymphs Abs: 0.5 K/uL — ABNORMAL LOW (ref 0.7–4.0)
MCH: 32.1 pg (ref 26.0–34.0)
MCHC: 33.3 g/dL (ref 30.0–36.0)
MCV: 96.3 fL (ref 80.0–100.0)
Monocytes Absolute: 1.3 K/uL — ABNORMAL HIGH (ref 0.1–1.0)
Monocytes Relative: 18 %
Neutro Abs: 5.3 K/uL (ref 1.7–7.7)
Neutrophils Relative %: 74 %
Platelets: 73 K/uL — ABNORMAL LOW (ref 150–400)
RBC: 3.77 MIL/uL — ABNORMAL LOW (ref 4.22–5.81)
RDW: 16.9 % — ABNORMAL HIGH (ref 11.5–15.5)
WBC: 7.2 K/uL (ref 4.0–10.5)
nRBC: 0 % (ref 0.0–0.2)

## 2023-07-20 LAB — URINALYSIS, COMPLETE (UACMP) WITH MICROSCOPIC
Bacteria, UA: NONE SEEN
Bilirubin Urine: NEGATIVE
Glucose, UA: NEGATIVE mg/dL
Ketones, ur: 20 mg/dL — AB
Leukocytes,Ua: NEGATIVE
Nitrite: NEGATIVE
Protein, ur: 30 mg/dL — AB
Specific Gravity, Urine: 1.017 (ref 1.005–1.030)
pH: 6 (ref 5.0–8.0)

## 2023-07-20 LAB — COMPREHENSIVE METABOLIC PANEL WITH GFR
ALT: 41 U/L (ref 0–44)
AST: 96 U/L — ABNORMAL HIGH (ref 15–41)
Albumin: 3.9 g/dL (ref 3.5–5.0)
Alkaline Phosphatase: 72 U/L (ref 38–126)
Anion gap: 14 (ref 5–15)
BUN: 11 mg/dL (ref 6–20)
CO2: 22 mmol/L (ref 22–32)
Calcium: 9.3 mg/dL (ref 8.9–10.3)
Chloride: 104 mmol/L (ref 98–111)
Creatinine, Ser: 0.82 mg/dL (ref 0.61–1.24)
GFR, Estimated: >60 mL/min (ref >60)
Glucose, Bld: 128 mg/dL — ABNORMAL HIGH (ref 70–99)
Potassium: 4.1 mmol/L (ref 3.5–5.1)
Sodium: 140 mmol/L (ref 135–145)
Total Bilirubin: 3.9 mg/dL — ABNORMAL HIGH (ref 0.0–1.2)
Total Protein: 8 g/dL (ref 6.5–8.1)

## 2023-07-20 LAB — ETHANOL: Alcohol, Ethyl (B): <15 mg/dL (ref <15)

## 2023-07-20 MED ORDER — PHENOBARBITAL SODIUM 65 MG/ML IJ SOLN
65.0000 mg | Freq: Three times a day (TID) | INTRAMUSCULAR | Status: DC
  Administered 2023-07-23 (×2): 65 mg via INTRAVENOUS
  Filled 2023-07-20 (×2): qty 1

## 2023-07-20 MED ORDER — PHENOBARBITAL SODIUM 130 MG/ML IJ SOLN
97.5000 mg | Freq: Three times a day (TID) | INTRAMUSCULAR | Status: AC
  Administered 2023-07-21 – 2023-07-22 (×6): 97.5 mg via INTRAVENOUS
  Filled 2023-07-20 (×3): qty 0.75
  Filled 2023-07-20: qty 1
  Filled 2023-07-20 (×3): qty 0.75
  Filled 2023-07-20: qty 1

## 2023-07-20 MED ORDER — METHOCARBAMOL 1000 MG/10ML IJ SOLN: 500.0000 mg | Freq: Four times a day (QID) | INTRAMUSCULAR | Status: DC | PRN

## 2023-07-20 MED ORDER — THIAMINE HCL 100 MG/ML IJ SOLN
100.0000 mg | Freq: Once | INTRAMUSCULAR | Status: AC
  Administered 2023-07-20: 100 mg via INTRAVENOUS
  Filled 2023-07-20: qty 2

## 2023-07-20 MED ORDER — PHENOBARBITAL SODIUM 65 MG/ML IJ SOLN: 32.5000 mg | Freq: Three times a day (TID) | INTRAMUSCULAR | Status: DC

## 2023-07-20 MED ORDER — THIAMINE MONONITRATE 100 MG PO TABS
100.0000 mg | ORAL_TABLET | Freq: Every day | ORAL | Status: DC
  Administered 2023-07-20 – 2023-07-25 (×6): 100 mg via ORAL
  Filled 2023-07-20 (×6): qty 1

## 2023-07-20 MED ORDER — LORAZEPAM 2 MG/ML IJ SOLN: 1.0000 mg | INTRAMUSCULAR | Status: AC | PRN

## 2023-07-20 MED ORDER — PHENOBARBITAL SODIUM 65 MG/ML IJ SOLN
130.0000 mg | Freq: Once | INTRAMUSCULAR | Status: AC
  Administered 2023-07-20: 130 mg via INTRAVENOUS
  Filled 2023-07-20: qty 2

## 2023-07-20 MED ORDER — ALBUTEROL SULFATE (2.5 MG/3ML) 0.083% IN NEBU: 2.5000 mg | INHALATION_SOLUTION | RESPIRATORY_TRACT | Status: DC | PRN

## 2023-07-20 MED ORDER — SODIUM CHLORIDE 0.9 % IV BOLUS
1000.0000 mL | Freq: Once | INTRAVENOUS | Status: AC
  Administered 2023-07-20: 1000 mL via INTRAVENOUS

## 2023-07-20 MED ORDER — MORPHINE SULFATE (PF) 2 MG/ML IV SOLN: 2.0000 mg | INTRAVENOUS | Status: DC | PRN

## 2023-07-20 MED ORDER — ACETAMINOPHEN 325 MG PO TABS: 650.0000 mg | ORAL_TABLET | Freq: Four times a day (QID) | ORAL | Status: DC | PRN

## 2023-07-20 MED ORDER — ACETAMINOPHEN 650 MG RE SUPP: 650.0000 mg | Freq: Four times a day (QID) | RECTAL | Status: DC | PRN

## 2023-07-20 MED ORDER — HYDRALAZINE HCL 20 MG/ML IJ SOLN: 10.0000 mg | INTRAMUSCULAR | Status: DC | PRN

## 2023-07-20 MED ORDER — ENOXAPARIN SODIUM 40 MG/0.4ML IJ SOSY
40.0000 mg | PREFILLED_SYRINGE | INTRAMUSCULAR | Status: DC
  Administered 2023-07-20 – 2023-07-24 (×5): 40 mg via SUBCUTANEOUS
  Filled 2023-07-20 (×5): qty 0.4

## 2023-07-20 MED ORDER — OXYCODONE HCL 5 MG PO TABS: 5.0000 mg | ORAL_TABLET | ORAL | Status: DC | PRN

## 2023-07-20 MED ORDER — ONDANSETRON HCL 4 MG PO TABS: 4.0000 mg | ORAL_TABLET | Freq: Four times a day (QID) | ORAL | Status: DC | PRN

## 2023-07-20 MED ORDER — ADULT MULTIVITAMIN W/MINERALS CH
1.0000 | ORAL_TABLET | Freq: Every day | ORAL | Status: DC
  Administered 2023-07-20 – 2023-07-25 (×6): 1 via ORAL
  Filled 2023-07-20 (×6): qty 1

## 2023-07-20 MED ORDER — ONDANSETRON HCL 4 MG/2ML IJ SOLN: 4.0000 mg | Freq: Four times a day (QID) | INTRAMUSCULAR | Status: DC | PRN

## 2023-07-20 MED ORDER — FOLIC ACID 1 MG PO TABS
1.0000 mg | ORAL_TABLET | Freq: Every day | ORAL | Status: DC
  Administered 2023-07-20 – 2023-07-25 (×6): 1 mg via ORAL
  Filled 2023-07-20 (×6): qty 1

## 2023-07-20 MED ORDER — LORAZEPAM 1 MG PO TABS: 1.0000 mg | ORAL_TABLET | ORAL | Status: AC | PRN

## 2023-07-20 MED ORDER — POTASSIUM CHLORIDE 2 MEQ/ML IV SOLN
INTRAVENOUS | Status: AC
  Filled 2023-07-20 (×4): qty 1000

## 2023-07-20 MED ORDER — PANTOPRAZOLE SODIUM 40 MG PO TBEC
40.0000 mg | DELAYED_RELEASE_TABLET | Freq: Every day | ORAL | Status: DC
  Administered 2023-07-20 – 2023-07-25 (×6): 40 mg via ORAL
  Filled 2023-07-20 (×6): qty 1

## 2023-07-20 MED ORDER — METOPROLOL SUCCINATE ER 50 MG PO TB24
50.0000 mg | ORAL_TABLET | Freq: Every day | ORAL | Status: DC
Start: 2023-07-20 — End: 2023-07-26
  Administered 2023-07-20 – 2023-07-25 (×6): 50 mg via ORAL
  Filled 2023-07-20 (×6): qty 1

## 2023-07-20 MED ORDER — THIAMINE HCL 100 MG/ML IJ SOLN
100.0000 mg | Freq: Every day | INTRAMUSCULAR | Status: DC
  Filled 2023-07-20 (×2): qty 2

## 2023-07-20 NOTE — ED Notes (Signed)
 Pt given bed bath by this RN and ED techs. New gown, bed pads, and brief applied. Bed sheets changed

## 2023-07-20 NOTE — ED Provider Notes (Signed)
 [facility] Provider Note    Event Date/Time   First MD Initiated Contact with Patient 07/20/23 1531     (approximate)   History   Tremors   HPI  Lawrence Rowe is a 53 y.o. male with history of hypertension, GERD, alcohol  abuse, presenting with tremors.  Patient states that his social worker saw him today, instructed him to come to the hospital because she noted that he was having tremors.  States that he drinks daily, states that he last had alcohol  this morning.  Does have history of tremors when he stops drinking.  He denies history of cirrhosis.  States that he has not been taking his blood pressure medications.  He denies any new falls or trauma.  Does have a rash to his lower extremities that he says are not new.  No other complaints.  He denies any SI or HI, no hallucinations.  He denies other drug use.  On independent chart review, he was admitted in March of this year for alcohol  withdrawals and early DTs, at that time he had stopped drinking for roughly 12 to 24 hours.     Physical Exam   Triage Vital Signs: ED Triage Vitals  Encounter Vitals Group     BP 07/20/23 1532 (!) 151/93     Girls Systolic BP Percentile --      Girls Diastolic BP Percentile --      Boys Systolic BP Percentile --      Boys Diastolic BP Percentile --      Pulse Rate 07/20/23 1532 (!) 105     Resp 07/20/23 1532 12     Temp 07/20/23 1532 98 F (36.7 C)     Temp Source 07/20/23 1532 Oral     SpO2 07/20/23 1532 99 %     Weight --      Height --      Head Circumference --      Peak Flow --      Pain Score 07/20/23 1533 5     Pain Loc --      Pain Education --      Exclude from Growth Chart --     Most recent vital signs: Vitals:   07/20/23 1532  BP: (!) 151/93  Pulse: (!) 105  Resp: 12  Temp: 98 F (36.7 C)  SpO2: 99%     General: Awake, no distress.  CV:  Good peripheral perfusion.  Resp:  Normal effort.  No tachypnea or respiratory  distress Abd:  No distention.  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 without overlying induration, palpable fluctuance, drainage   ED Results / Procedures / Treatments   Labs (all labs ordered are listed, but only abnormal results are displayed) Labs Reviewed  COMPREHENSIVE METABOLIC PANEL WITH GFR - Abnormal; Notable for the following components:      Result Value   Glucose, Bld 128 (*)    AST 96 (*)    Total Bilirubin 3.9 (*)    All other components within normal limits  CBC WITH DIFFERENTIAL/PLATELET - Abnormal; Notable for the following components:   RBC 3.77 (*)    Hemoglobin 12.1 (*)    HCT 36.3 (*)    RDW 16.9 (*)    Platelets 73 (*)    Lymphs  Abs 0.5 (*)    Monocytes Absolute 1.3 (*)    All other components within normal limits  ETHANOL     EKG  EKG shows, sinus tachycardia, rate 104, normal QRS, normal QTc, no obvious ischemic ST elevation, PVC noted, not seen which is compared to prior   RADIOLOGY On my independent interpretation, right upper quadrant without obvious gallstones, I do see some ascites   PROCEDURES:  Critical Care performed: Yes, see critical care procedure note(s)  .Critical Care  Performed by: Waymond Lorelle Cummins, MD Authorized by: Waymond Lorelle Cummins, MD   Critical care provider statement:    Critical care time (minutes):  40   Critical care was necessary to treat or prevent imminent or life-threatening deterioration of the following conditions:  CNS failure or compromise   Critical care was time spent personally by me on the following activities:  Development of treatment plan with patient or surrogate, discussions with consultants, evaluation of patient's response to treatment, examination of patient, ordering and review of laboratory  studies, ordering and review of radiographic studies, ordering and performing treatments and interventions, pulse oximetry, re-evaluation of patient's condition and review of old charts    MEDICATIONS ORDERED IN ED: Medications  PHENObarbital  (LUMINAL) injection 130 mg (has no administration in time range)  sodium chloride  0.9 % bolus 1,000 mL (1,000 mLs Intravenous New Bag/Given 07/20/23 1654)  thiamine  (VITAMIN B1) injection 100 mg (100 mg Intravenous Given 07/20/23 1653)  PHENObarbital  (LUMINAL) injection 130 mg (130 mg Intravenous Given 07/20/23 1648)     IMPRESSION / MDM / ASSESSMENT AND PLAN / ED COURSE  I reviewed the triage vital signs and the nursing notes.                              Differential diagnosis includes, but is not limited to, alcohol  withdrawal, electrolyte derangements, dehydration.  Will get labs, EKG, will give some fluids, IV thiamine , IV phenobarbital .  Patient's presentation is most consistent with acute presentation with potential threat to life or bodily function.  Independent interpretation of labs and imaging below.  On reassessment patient is still having tongue fasciculations and tremors, will give another dose of phenobarbital  and plan to have him come in for alcohol  withdrawal.  Consult to hospitalist was agreeable with the plan for admission and will evaluate the patient.  He is admitted.  The patient is on the cardiac monitor to evaluate for evidence of arrhythmia and/or significant heart rate changes.   Clinical Course as of 07/20/23 1742  Fri Jul 20, 2023  1631 Independent review of labs, no leukocytosis, his platelets are low, ethanol levels not elevated, electrolytes not really deranged.  Platelets have been low in the past compared to prior. [TT]  1632 Independent review of labs, electrolytes not severely deranged, creatinine is normal, AST and T. bili are elevated, patient does not have any abdominal pain at this time but will get a right upper  quadrant ultrasound given that the T. bili is more elevated compared to prior.  Do wonder if patient has cirrhosis. [TT]    Clinical Course User Index [TT] Waymond, Lorelle Cummins, MD     FINAL CLINICAL IMPRESSION(S) / ED DIAGNOSES   Final diagnoses:  Alcohol  withdrawal syndrome with complication (HCC)     Rx / DC Orders   ED Discharge Orders     None        Note:  This document was prepared using Dragon  voice recognition software and may include unintentional dictation errors.    Waymond Lorelle Cummins, MD 07/20/23 [phone]

## 2023-07-20 NOTE — H&P (Signed)
 History and Physical    ZYAIRE MCCLEOD FMW:[id number] DOB: 1970-05-03 DOA: 07/20/2023  PCP: Pcp, No (Confirm with patient/family/NH records and if not entered, this has to be entered at [facility] point of entry) Patient coming from: home  I have personally briefly reviewed patient's old medical records in [facility]  Chief Complaint: EtOH withdrawal  HPI: JAMARIEN RODKEY is a 53 y.o. male with medical history significant of hypertension, GERD, EtOH abuse who presents with tremors and signs of alcohol  withdrawal.  Patient was with his social worker today who noted him to be tremulous and directed him to the emergency department.  Patient apparently drinks daily and last reported consumption of alcohol  was this morning.  Patient reports drinking 3 beers this morning.  No known history of cirrhosis.  Has been nonadherent to blood pressure medications.  On my evaluation patient is in bed.  He appears uncomfortable and tremulous.  He answers all questions appropriately.  Vital signs significant for hypertension and tachycardia.  Patient is saturating well on room air.  ED Course: Patient was given a bolus of intravenous fluids and 2 doses of intravenous phenobarbital .  Remained tremulous, tachycardic, hypertensive.  Hospitalist contacted for admission.  Review of Systems: As per HPI otherwise 14 point review of systems negative.   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 His smoking use included cigarettes. He has never used smokeless tobacco. He reports current alcohol  use of about 12.0 standard drinks of alcohol  per week. He reports that he does not use drugs.  Allergies  Allergen Reactions    Penicillins Other (See Comments)    Family History  Problem Relation Age of Onset   Alcohol  abuse Neg Hx   No known family history of alcohol  use  Prior to Admission medications   Medication Sig Start Date End Date Taking? Authorizing Provider  folic acid  (FOLVITE ) 1 MG tablet Take 1 tablet (1 mg total) by mouth daily. 04/07/23   Dorinda Drue DASEN, MD  losartan  (COZAAR ) 50 MG tablet Take 1 tablet (50 mg total) by mouth daily. Patient not taking: Reported on 04/06/2023 06/17/22   Kandis Devaughn Sayres, MD  metoprolol  succinate (TOPROL -XL) 50 MG 24 hr tablet Take 1 tablet (50 mg total) by mouth daily. Take with or immediately following a meal. 06/16/22   Wouk, Devaughn Sayres, MD  omeprazole  (PRILOSEC) 10 MG capsule Take 1 capsule (10 mg total) by mouth daily. 04/07/23   Dorinda Drue DASEN, MD  thiamine  (VITAMIN B1) 100 MG tablet Take 1 tablet (100 mg total) by mouth daily. 04/07/23   Dorinda Drue DASEN, MD    Physical Exam: Vitals:   07/20/23 1532 07/20/23 1533  BP: (!) 151/93   Pulse: (!) 105   Resp: 12   Temp: 98 F (36.7 C)   TempSrc: Oral   SpO2: 99%   Weight:  99.5 kg  Height:  6' (1.829 m)   General: Tremulous, fatigued, disheveled HEENT: Normocephalic, atraumatic Neck, supple, trachea midline, no tenderness Heart: Tachycardic and regular rhythm, no murmurs, no pedal edema Lungs: Clear to auscultation bilaterally, no adventitious sounds, normal work of breathing Abdomen: Soft, nontender, nondistended, positive bowel sounds Extremities: Normal,  atraumatic, no clubbing or cyanosis, normal muscle tone Skin: Peeling rash on bilateral lower extremities Neurologic: Cranial nerves grossly intact, sensation intact, alert and oriented x3 Psychiatric: Flattened affect   Labs on Admission: I have personally reviewed following labs and imaging studies  CBC: Recent Labs  Lab 07/20/23 1534  WBC 7.2  NEUTROABS 5.3  HGB 12.1*  HCT 36.3*  MCV 96.3  PLT 73*   Basic Metabolic Panel: Recent Labs   Lab 07/20/23 1534  NA 140  K 4.1  CL 104  CO2 22  GLUCOSE 128*  BUN 11  CREATININE 0.82  CALCIUM 9.3   GFR: Estimated Creatinine Clearance: 124.4 mL/min (by C-G formula based on SCr of 0.82 mg/dL). Liver Function Tests: Recent Labs  Lab 07/20/23 1534  AST 96*  ALT 41  ALKPHOS 72  BILITOT 3.9*  PROT 8.0  ALBUMIN 3.9   No results for input(s): LIPASE, AMYLASE in the last 168 hours. No results for input(s): AMMONIA in the last 168 hours. Coagulation Profile: No results for input(s): INR, PROTIME in the last 168 hours. Cardiac Enzymes: No results for input(s): CKTOTAL, CKMB, CKMBINDEX, TROPONINI in the last 168 hours. BNP (last 3 results) No results for input(s): PROBNP in the last 8760 hours. HbA1C: No results for input(s): HGBA1C in the last 72 hours. CBG: No results for input(s): GLUCAP in the last 168 hours. Lipid Profile: No results for input(s): CHOL, HDL, LDLCALC, TRIG, CHOLHDL, LDLDIRECT in the last 72 hours. Thyroid  Function Tests: No results for input(s): TSH, T4TOTAL, FREET4, T3FREE, THYROIDAB in the last 72 hours. Anemia Panel: No results for input(s): VITAMINB12, FOLATE, FERRITIN, TIBC, IRON, RETICCTPCT in the last 72 hours. Urine analysis:    Component Value Date/Time   COLORURINE YELLOW (A) 04/05/2023 1229   APPEARANCEUR CLEAR (A) 04/05/2023 1229   LABSPEC 1.006 04/05/2023 1229   PHURINE 7.0 04/05/2023 1229   GLUCOSEU NEGATIVE 04/05/2023 1229   HGBUR NEGATIVE 04/05/2023 1229   BILIRUBINUR NEGATIVE 04/05/2023 1229   KETONESUR 5 (A) 04/05/2023 1229   PROTEINUR NEGATIVE 04/05/2023 1229   NITRITE NEGATIVE 04/05/2023 1229   LEUKOCYTESUR NEGATIVE 04/05/2023 1229    Radiological Exams on Admission: US  ABDOMEN LIMITED RUQ (LIVER/GB) Result Date: 07/20/2023 CLINICAL DATA:  Transaminitis. EXAM: ULTRASOUND ABDOMEN LIMITED RIGHT UPPER QUADRANT COMPARISON:  January 11, 2022.  June 19, 2022. FINDINGS:  Gallbladder: No gallstones or wall thickening visualized. No sonographic Murphy sign noted by sonographer. Common bile duct: Diameter: 5 mm which is within normal limits. Liver: Slightly nodular hepatic contour is noted suggesting hepatic cirrhosis. No definite focal sonographic hepatic abnormality is noted. Portal vein is patent on color Doppler imaging with normal direction of blood flow towards the liver. Other: Small amount of free fluid is noted around the liver. IMPRESSION: Probable hepatic cirrhosis with small amount of fluid noted around the liver. Electronically Signed   By: Lynwood Landy Raddle M.D.   On: 07/20/2023 17:40    EKG: Independently reviewed.  Sinus tachycardia  Assessment/Plan Principal Problem:   Alcohol  withdrawal delirium, acute, hypoactive (HCC)  Acute alcohol  withdrawal EtOH level negative.  Patient reports drinking daily.  Reports drinking 3 beers on the morning of admission.  Is found to be tremulous with associated tachycardia and hypotension.  Received 2 doses of intravenous phenobarbital  in the emergency department with minimal change in tremors Plan: Admit inpatient Medical telemetry Phenobarbital  taper CIWA protocol as needed IV Ativan  Aggressive IV fluids Daily folate, B12, multivitamin TOC consult for substance use resources  Essential hypertension Restart  home metoprolol  Hold losartan  for now As needed IV hydralazine   GERD PPI  DVT prophylaxis: SQ Lovenox  Code Status: Full Family Communication: None Disposition Plan: Return to previous home environment Consults called: None Admission status: Inpatient, MedSurg   Calvin KATHEE Robson MD [facility]  If 7PM-7AM, please contact night-coverage   07/20/2023, 5:47 PM

## 2023-07-20 NOTE — ED Triage Notes (Signed)
 Pt arrives via [facility] from home for detox and tremors. DSS came to check on pt and bring him food but pt did not eat any. Hx of alcoholism and hypertension. Pt stopped taking hypertension meds earlier this week. No power in pts home but does have running water. Pt arrives with tremors and open sores on bilateral legs. Pt has had 3 drinks today.

## 2023-07-21 DIAGNOSIS — F10231 Alcohol dependence with withdrawal delirium: Secondary | ICD-10-CM | POA: Diagnosis not present

## 2023-07-21 LAB — COMPREHENSIVE METABOLIC PANEL WITH GFR
ALT: 34 U/L (ref 0–44)
AST: 72 U/L — ABNORMAL HIGH (ref 15–41)
Albumin: 3.3 g/dL — ABNORMAL LOW (ref 3.5–5.0)
Alkaline Phosphatase: 63 U/L (ref 38–126)
Anion gap: 11 (ref 5–15)
BUN: 10 mg/dL (ref 6–20)
CO2: 22 mmol/L (ref 22–32)
Calcium: 8.6 mg/dL — ABNORMAL LOW (ref 8.9–10.3)
Chloride: 105 mmol/L (ref 98–111)
Creatinine, Ser: 0.63 mg/dL (ref 0.61–1.24)
GFR, Estimated: >60 mL/min (ref >60)
Glucose, Bld: 111 mg/dL — ABNORMAL HIGH (ref 70–99)
Potassium: 3.6 mmol/L (ref 3.5–5.1)
Sodium: 138 mmol/L (ref 135–145)
Total Bilirubin: 3.3 mg/dL — ABNORMAL HIGH (ref 0.0–1.2)
Total Protein: 6.8 g/dL (ref 6.5–8.1)

## 2023-07-21 LAB — CBC
HCT: 32.9 % — ABNORMAL LOW (ref 39.0–52.0)
Hemoglobin: 11 g/dL — ABNORMAL LOW (ref 13.0–17.0)
MCH: 32.2 pg (ref 26.0–34.0)
MCHC: 33.4 g/dL (ref 30.0–36.0)
MCV: 96.2 fL (ref 80.0–100.0)
Platelets: 68 K/uL — ABNORMAL LOW (ref 150–400)
RBC: 3.42 MIL/uL — ABNORMAL LOW (ref 4.22–5.81)
RDW: 16.8 % — ABNORMAL HIGH (ref 11.5–15.5)
WBC: 6.3 K/uL (ref 4.0–10.5)
nRBC: 0 % (ref 0.0–0.2)

## 2023-07-21 LAB — PROTIME-INR
INR: 1.8 — ABNORMAL HIGH (ref 0.8–1.2)
Prothrombin Time: 21.8 s — ABNORMAL HIGH (ref 11.4–15.2)

## 2023-07-21 MED ORDER — MUPIROCIN 2 % EX OINT
TOPICAL_OINTMENT | Freq: Two times a day (BID) | CUTANEOUS | Status: DC
  Administered 2023-07-23: 1 via TOPICAL
  Filled 2023-07-21: qty 22

## 2023-07-21 MED ORDER — LOSARTAN POTASSIUM 50 MG PO TABS
50.0000 mg | ORAL_TABLET | Freq: Every day | ORAL | Status: DC
  Administered 2023-07-21 – 2023-07-25 (×5): 50 mg via ORAL
  Filled 2023-07-21 (×5): qty 1

## 2023-07-21 MED ORDER — POLYVINYL ALCOHOL 1.4 % OP SOLN
1.0000 [drp] | OPHTHALMIC | Status: DC | PRN
  Administered 2023-07-21: 1 [drp] via OPHTHALMIC
  Filled 2023-07-21: qty 15

## 2023-07-21 NOTE — ED Notes (Signed)
 Pt asking for another cup of coffee, provided to pt. Pt asking this RN to help with creamer. Pt has no other needs at this time.

## 2023-07-21 NOTE — ED Notes (Signed)
 Pt stating having no pain but eyes are dry and would like some eye drops.

## 2023-07-21 NOTE — Plan of Care (Signed)
  Problem: Education: Goal: Knowledge of General Education information will improve Description: Including pain rating scale, medication(s)/side effects and non-pharmacologic comfort measures Outcome: Progressing   Problem: Health Behavior/Discharge Planning: Goal: Ability to manage health-related needs will improve Outcome: Progressing   Problem: Clinical Measurements: Goal: Ability to maintain clinical measurements within normal limits will improve Outcome: Progressing Goal: Will remain free from infection Outcome: Progressing Goal: Diagnostic test results will improve Outcome: Progressing Goal: Respiratory complications will improve Outcome: Progressing Goal: Cardiovascular complication will be avoided Outcome: Progressing   Problem: Activity: Goal: Risk for activity intolerance will decrease Outcome: Progressing   Problem: Nutrition: Goal: Adequate nutrition will be maintained Outcome: Progressing   Problem: Coping: Goal: Level of anxiety will decrease Outcome: Progressing   Problem: Pain Managment: Goal: General experience of comfort will improve and/or be controlled Outcome: Progressing   Problem: Elimination: Goal: Will not experience complications related to bowel motility Outcome: Progressing Goal: Will not experience complications related to urinary retention Outcome: Progressing

## 2023-07-21 NOTE — Progress Notes (Signed)
 PROGRESS NOTE    Lawrence Rowe  FMW:[id number] DOB: [birth year] DOA: 07/20/2023 PCP: Pcp, No    Brief Narrative:  53 y.o. male with medical history significant of hypertension, GERD, EtOH abuse who presents with tremors and signs of alcohol  withdrawal.  Patient was with his social worker today who noted him to be tremulous and directed him to the emergency department.  Patient apparently drinks daily and last reported consumption of alcohol  was this morning.  Patient reports drinking 3 beers this morning.  No known history of cirrhosis.  Has been nonadherent to blood pressure medications.   On my evaluation patient is in bed.  He appears uncomfortable and tremulous.  He answers all questions appropriately.  Vital signs significant for hypertension and tachycardia.  Patient is saturating well on room air.   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  Patient reports drinking daily.  Reports drinking 3 beers on the morning of admission.  Is found to be tremulous with associated tachycardia and hypotension.  Received 2 doses of intravenous phenobarbital  in the emergency department with minimal change in tremors.  Improving.  Abdominal ultrasound indicates cirrhotic morphology of liver. MELD-Na score 18 confers 6% 59-month mortality Child Pugh score of 9, class B, 19% estimated 1 year mortality Plan: Continue phenobarbital  taper Continue CIWA protocol Continue IV fluids Folate, B12, multivitamin Substance abuse resources per TOC   Essential hypertension Restart home metoprolol  Restart losartan  As needed IV hydralazine    GERD Continue PPI  Anemia Thrombocytopenia Likely in the setting of chronic alcohol  use.  On B12 and folate.  Thrombocytopenia mild and likely secondary to underlying cirrhosis.  No evidence of bleeding.  Continue to monitor.    DVT  prophylaxis: Lovenox  Code Status: Full Family Communication: None Disposition Plan: Status is: Inpatient Remains inpatient appropriate because: Acute alcohol  withdrawal   Level of care: Telemetry Medical  Consultants:  None  Procedures:  None  Antimicrobials: None   Subjective: Seen and examined.  Tremors improved.  Objective: Vitals:   07/21/23 1030 07/21/23 1041 07/21/23 1046 07/21/23 1130  BP: (!) 149/87  (!) 149/87 128/68  Pulse: 68  68 74  Resp: 15   (!) 23  Temp:  98.2 F (36.8 C)    TempSrc:  Oral    SpO2: 99%   98%  Weight:      Height:       No intake or output data in the 24 hours ending 07/21/23 1421 Filed Weights   07/20/23 1533  Weight: 99.5 kg    Examination:  General: Tremulous, fatigued, disheveled HEENT: Normocephalic, atraumatic Neck, supple, trachea midline, no tenderness Heart: Tachycardic and regular rhythm, no murmurs, no pedal edema Lungs: Clear to auscultation bilaterally, no adventitious sounds, normal work of breathing Abdomen: Soft, nontender, nondistended, positive bowel sounds Extremities: Normal, atraumatic, no clubbing or cyanosis, normal muscle tone Skin: Peeling rash on bilateral lower extremities Neurologic: Cranial nerves grossly intact, sensation intact, alert and oriented x3 Psychiatric: Flattened affect       Data Reviewed: I have personally reviewed following labs and imaging studies  CBC: Recent Labs  Lab 07/20/23 1534 07/21/23 0501  WBC 7.2 6.3  NEUTROABS 5.3  --   HGB 12.1* 11.0*  HCT 36.3* 32.9*  MCV 96.3 96.2  PLT 73* 68*   Basic Metabolic Panel: Recent Labs  Lab 07/20/23 1534 07/21/23 0501  NA 140 138  K 4.1 3.6  CL 104 105  CO2 22 22  GLUCOSE 128* 111*  BUN 11 10  CREATININE 0.82 0.63  CALCIUM 9.3 8.6*   GFR: Estimated Creatinine Clearance: 127.5 mL/min (by C-G formula based on SCr of 0.63 mg/dL). Liver Function Tests: Recent Labs  Lab 07/20/23 1534 07/21/23 0501  AST 96* 72*   ALT 41 34  ALKPHOS 72 63  BILITOT 3.9* 3.3*  PROT 8.0 6.8  ALBUMIN 3.9 3.3*   No results for input(s): LIPASE, AMYLASE in the last 168 hours. No results for input(s): AMMONIA in the last 168 hours. Coagulation Profile: Recent Labs  Lab 07/21/23 0501  INR 1.8*   Cardiac Enzymes: No results for input(s): CKTOTAL, CKMB, CKMBINDEX, TROPONINI in the last 168 hours. BNP (last 3 results) No results for input(s): PROBNP in the last 8760 hours. HbA1C: No results for input(s): HGBA1C in the last 72 hours. CBG: No results for input(s): GLUCAP in the last 168 hours. Lipid Profile: No results for input(s): CHOL, HDL, LDLCALC, TRIG, CHOLHDL, LDLDIRECT in the last 72 hours. Thyroid  Function Tests: No results for input(s): TSH, T4TOTAL, FREET4, T3FREE, THYROIDAB in the last 72 hours. Anemia Panel: No results for input(s): VITAMINB12, FOLATE, FERRITIN, TIBC, IRON, RETICCTPCT in the last 72 hours. Sepsis Labs: No results for input(s): PROCALCITON, LATICACIDVEN in the last 168 hours.  No results found for this or any previous visit (from the past 240 hours).       Radiology Studies: US  ABDOMEN LIMITED RUQ (LIVER/GB) Result Date: 07/20/2023 CLINICAL DATA:  Transaminitis. EXAM: ULTRASOUND ABDOMEN LIMITED RIGHT UPPER QUADRANT COMPARISON:  January 11, 2022.  June 19, 2022. FINDINGS: Gallbladder: No gallstones or wall thickening visualized. No sonographic Murphy sign noted by sonographer. Common bile duct: Diameter: 5 mm which is within normal limits. Liver: Slightly nodular hepatic contour is noted suggesting hepatic cirrhosis. No definite focal sonographic hepatic abnormality is noted. Portal vein is patent on color Doppler imaging with normal direction of blood flow towards the liver. Other: Small amount of free fluid is noted around the liver. IMPRESSION: Probable hepatic cirrhosis with small amount of fluid noted around the liver.  Electronically Signed   By: Lynwood Landy Raddle M.D.   On: 07/20/2023 17:40        Scheduled Meds:  enoxaparin  (LOVENOX ) injection  40 mg Subcutaneous Q24H   folic acid   1 mg Oral Daily   metoprolol  succinate  50 mg Oral Daily   multivitamin with minerals  1 tablet Oral Daily   pantoprazole   40 mg Oral Daily   PHENObarbital   97.5 mg Intravenous Q8H   Followed by   [START ON 07/23/2023] PHENObarbital   65 mg Intravenous Q8H   Followed by   NOREEN ON 07/25/2023] PHENObarbital   32.5 mg Intravenous Q8H   thiamine   100 mg Oral Daily   Or   thiamine   100 mg Intravenous Daily   Continuous Infusions:  lactated ringers  1,000 mL with potassium chloride  20 mEq infusion 75 mL/hr at 07/21/23 0948     LOS: 1 day     Calvin KATHEE Robson, MD [facility]   If 7PM-7AM, please contact night-coverage  07/21/2023, 2:21 PM

## 2023-07-21 NOTE — ED Notes (Signed)
 Pt was unable to hold urine before this RN could make it into room. Pt cleaned of urine, bed linen changed, new brief and chuck pad placed. Warm blanket given. Pt asking for another cup of coffee, provided to pt. Pt also asking for this RN to call his mother. Called house and mobile number listed with no answer. Pt informed we could try again later.

## 2023-07-21 NOTE — ED Notes (Signed)
 Pt laying in bed eating lunch at this time. Has no other needs currently, will continue to monitor.

## 2023-07-21 NOTE — ED Notes (Signed)
 Pt up to use restroom, one person assist required pt unsteady on feet. Pt able to void, brief changed, pt returned to bed and repositioned to eat breakfast. New blanket provided.

## 2023-07-21 NOTE — ED Notes (Signed)
 RN to bedside to assist pt to use urinal.

## 2023-07-21 NOTE — Consult Note (Addendum)
 WOC Nurse Consult Note: patient with rash to bilateral lower legs of unknown etiology, some full thickness wounds ? If related to scratching of the plaques present; patient states this rash has been an ongoing issue but do not find any dermatology or PCP notes regarding it  Reason for Consult: B lower extremity wounds  Wound type: full thickness ? R/t trauma from scratching vs other  Pressure Injury POA: NA  Measurement: see nursing flowsheet  Wound bed: widespread rash to B lower legs with raised hyperkeratotic looking plaques, small scattered areas that are red and moist  Drainage (amount, consistency, odor) see nursing flowsheet  Periwound: Dressing procedure/placement/frequency: Cleanse any open wounds to B lower legs with NS and apply Mupirocin  ointment 2 times daily to wound beds and secure with silicone foam or dry gauze and clothe tape.    Patient needs dermatology evaluation as outpatient for diagnosis and management of underlying skin condition.    POC discussed with bedside nurse. WOC team will not follow. Re-consult if further needs arise.   Thank you,    Powell Bar MSN, RN-BC, [employer]

## 2023-07-22 DIAGNOSIS — F10231 Alcohol dependence with withdrawal delirium: Secondary | ICD-10-CM | POA: Diagnosis not present

## 2023-07-22 NOTE — Plan of Care (Signed)

## 2023-07-22 NOTE — Progress Notes (Signed)
 PT Cancellation Note  Patient Details Name: RALPHIE LOVELADY MRN: [id number] DOB: March 16, 1970   Cancelled Treatment:    Reason Eval/Treat Not Completed: Other (comment) Order received, chart reviewed.  On arrival to room pt was walking w/o AD out of the bathroom back to his bed.  He reports he feels a little better but with some abominal pain/tightness.  He states he normally does not need an AD but does have a walker he'll occasionally use.  When PT encouraged doing some activity (more prolonged ambulation, balance testing, etc) he kept saying Can we put this off for now?  Educated on importance of activity and goal to assess his ability to safely return home but he kept citing feeling tired and just I don't feel like it right now.  Pt asks PT to return tomorrow.    Carmin JONELLE Deed, DPT 07/22/2023, 2:31 PM

## 2023-07-22 NOTE — Progress Notes (Signed)
 PROGRESS NOTE    Lawrence Rowe  FMW:[id number] DOB: 14-Dec-1970 DOA: 07/20/2023 PCP: Pcp, No    Brief Narrative:  53 y.o. male with medical history significant of hypertension, GERD, EtOH abuse who presents with tremors and signs of alcohol  withdrawal.  Patient was with his social worker today who noted him to be tremulous and directed him to the emergency department.  Patient apparently drinks daily and last reported consumption of alcohol  was this morning.  Patient reports drinking 3 beers this morning.  No known history of cirrhosis.  Has been nonadherent to blood pressure medications.   On my evaluation patient is in bed.  He appears uncomfortable and tremulous.  He answers all questions appropriately.  Vital signs significant for hypertension and tachycardia.  Patient is saturating well on room air.   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  Patient reports drinking daily.  Reports drinking 3 beers on the morning of admission.  Is found to be tremulous with associated tachycardia and hypotension.  Received 2 doses of intravenous phenobarbital  in the emergency department with minimal change in tremors.  Improving.  Abdominal ultrasound indicates cirrhotic morphology of liver. MELD-Na score 18 confers 6% 8-month mortality Child Pugh score of 9, class B, 19% estimated 1 year mortality Plan: Continue phenobarbital  taper Continue CIWA protocol Discontinue IV fluids Continue folate, B12, multivitamin Substance abuse resources per TOC   Essential hypertension Continue home metoprolol  Continue losartan  As needed IV hydralazine    GERD Continue PPI  Anemia Thrombocytopenia Likely in the setting of chronic alcohol  use.   On B12 and folate.   Thrombocytopenia mild and likely secondary to underlying cirrhosis.   No evidence of bleeding.   Continue to  monitor.  Bilateral lower extremity rash Unclear etiology.  Appreciate wound care recommendations.  Will need outpatient referral to dermatology on DC.    DVT prophylaxis: Lovenox  Code Status: Full Family Communication: None Disposition Plan: Status is: Inpatient Remains inpatient appropriate because: Acute alcohol  withdrawal   Level of care: Telemetry Medical  Consultants:  None  Procedures:  None  Antimicrobials: None   Subjective: Seen and examined.  Tremors improved.  Objective: Vitals:   07/21/23 2009 07/22/23 0029 07/22/23 0601 07/22/23 0813  BP: 121/73 (!) 155/103 131/81 (!) 146/88  Pulse: 75 72 67 62  Resp: 18 18 18 16   Temp: 98.4 F (36.9 C) 98.5 F (36.9 C) 98.1 F (36.7 C) 97.7 F (36.5 C)  TempSrc: Oral Oral    SpO2: 96% 98% 98% 98%  Weight:      Height:        Intake/Output Summary (Last 24 hours) at 07/22/2023 1102 Last data filed at 07/21/2023 1800 Gross per 24 hour  Intake 2166.08 ml  Output 600 ml  Net 1566.08 ml   Filed Weights   07/20/23 1533  Weight: 99.5 kg    Examination:  General: NAD HEENT: Normocephalic, atraumatic Neck, supple, trachea midline, no tenderness Heart: S1-S2, RRR, no murmurs, no pedal edema Lungs: Lungs clear.  Normal work of breathing.  Room air Abdomen: Soft, nontender, nondistended, positive bowel sounds Extremities: Normal, atraumatic, no clubbing or cyanosis, normal muscle tone Skin: Peeling rash on bilateral lower extremities Neurologic: Cranial nerves grossly intact, sensation intact, alert and oriented x3 Psychiatric: Flattened affect       Data Reviewed: I have personally reviewed following labs and imaging studies  CBC: Recent Labs  Lab 07/20/23  1534 07/21/23 0501  WBC 7.2 6.3  NEUTROABS 5.3  --   HGB 12.1* 11.0*  HCT 36.3* 32.9*  MCV 96.3 96.2  PLT 73* 68*   Basic Metabolic Panel: Recent Labs  Lab 07/20/23 1534 07/21/23 0501  NA 140 138  K 4.1 3.6  CL 104 105  CO2 22 22   GLUCOSE 128* 111*  BUN 11 10  CREATININE 0.82 0.63  CALCIUM 9.3 8.6*   GFR: Estimated Creatinine Clearance: 127.5 mL/min (by C-G formula based on SCr of 0.63 mg/dL). Liver Function Tests: Recent Labs  Lab 07/20/23 1534 07/21/23 0501  AST 96* 72*  ALT 41 34  ALKPHOS 72 63  BILITOT 3.9* 3.3*  PROT 8.0 6.8  ALBUMIN 3.9 3.3*   No results for input(s): LIPASE, AMYLASE in the last 168 hours. No results for input(s): AMMONIA in the last 168 hours. Coagulation Profile: Recent Labs  Lab 07/21/23 0501  INR 1.8*   Cardiac Enzymes: No results for input(s): CKTOTAL, CKMB, CKMBINDEX, TROPONINI in the last 168 hours. BNP (last 3 results) No results for input(s): PROBNP in the last 8760 hours. HbA1C: No results for input(s): HGBA1C in the last 72 hours. CBG: No results for input(s): GLUCAP in the last 168 hours. Lipid Profile: No results for input(s): CHOL, HDL, LDLCALC, TRIG, CHOLHDL, LDLDIRECT in the last 72 hours. Thyroid  Function Tests: No results for input(s): TSH, T4TOTAL, FREET4, T3FREE, THYROIDAB in the last 72 hours. Anemia Panel: No results for input(s): VITAMINB12, FOLATE, FERRITIN, TIBC, IRON, RETICCTPCT in the last 72 hours. Sepsis Labs: No results for input(s): PROCALCITON, LATICACIDVEN in the last 168 hours.  No results found for this or any previous visit (from the past 240 hours).       Radiology Studies: US  ABDOMEN LIMITED RUQ (LIVER/GB) Result Date: 07/20/2023 CLINICAL DATA:  Transaminitis. EXAM: ULTRASOUND ABDOMEN LIMITED RIGHT UPPER QUADRANT COMPARISON:  January 11, 2022.  June 19, 2022. FINDINGS: Gallbladder: No gallstones or wall thickening visualized. No sonographic Murphy sign noted by sonographer. Common bile duct: Diameter: 5 mm which is within normal limits. Liver: Slightly nodular hepatic contour is noted suggesting hepatic cirrhosis. No definite focal sonographic hepatic abnormality is noted.  Portal vein is patent on color Doppler imaging with normal direction of blood flow towards the liver. Other: Small amount of free fluid is noted around the liver. IMPRESSION: Probable hepatic cirrhosis with small amount of fluid noted around the liver. Electronically Signed   By: Lynwood Landy Raddle M.D.   On: 07/20/2023 17:40        Scheduled Meds:  enoxaparin  (LOVENOX ) injection  40 mg Subcutaneous Q24H   folic acid   1 mg Oral Daily   losartan   50 mg Oral Daily   metoprolol  succinate  50 mg Oral Daily   multivitamin with minerals  1 tablet Oral Daily   mupirocin  ointment   Topical BID   pantoprazole   40 mg Oral Daily   PHENObarbital   97.5 mg Intravenous Q8H   Followed by   NOREEN ON 07/23/2023] PHENObarbital   65 mg Intravenous Q8H   Followed by   NOREEN ON 07/25/2023] PHENObarbital   32.5 mg Intravenous Q8H   thiamine   100 mg Oral Daily   Or   thiamine   100 mg Intravenous Daily   Continuous Infusions:     LOS: 2 days     Calvin KATHEE Robson, MD [facility]   If 7PM-7AM, please contact night-coverage  07/22/2023, 11:02 AM

## 2023-07-22 NOTE — Evaluation (Signed)
 Occupational Therapy Evaluation Patient Details Name: Lawrence Rowe MRN: [id number] DOB: 1970/08/12 Today's Date: 07/22/2023   History of Present Illness   Lawrence Rowe is a 53 y.o. male with medical history significant of hypertension, GERD, EtOH abuse who presents with tremors and signs of alcohol  withdrawal. Patient was with his social worker today who noted him to be tremulous and directed him to the emergency department. Patient apparently drinks daily.  No known history of cirrhosis.  Has been nonadherent to blood pressure medications.     Clinical Impressions Pt presents with generalized weakness, lethargy, flat affect. PTA, he has been living alone in a 2-story home with basement, reports that he is able to get up and down from 2nd story (bedroom and bath) and up and down from basement (laundry). He states he has a RW at home but does not use this or any other AD; he reports 1 fall this year. He states he drives, does not cook, has all food delivered, and that he drinks ~ 12 beers per day. He is not employed at present. During today's evaluation, pt attempts ambulation w/o AD but is unsteady in standing. Conducted remainder of session with pt's using RW. He was able to ambulate in room, perform grooming in standing, and UB dressing in sitting. Pt is minimally engaged in session and demonstrates flat affect and no eye contact. 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 Therapist informed pt that he needed to use the other remote and showed pt how to control volume and channels. ~ 5 minutes later, pt again needs instructions as to how to use the remote. Engaged pt in discusstion re: if he had any interest in reducing his level of alcohol  consumption; pt states he does not: he reports that he gets too bored if he is not drinking in the evenings. Pt could benefit from ongoing OT while hospitalized, to build strength, endurance, and to enhance safety, given his  current unsteadiness while performing fxl mobility tasks. Do not anticipate that pt will require OT services post DC, as he is close to his baseline level of function.      If plan is discharge home, recommend the following:         Functional Status Assessment   Patient has had a recent decline in their functional status and demonstrates the ability to make significant improvements in function in a reasonable and predictable amount of time.     Equipment Recommendations   None recommended by OT     Recommendations for Other Services         Precautions/Restrictions   Precautions Precautions: Fall Restrictions Weight Bearing Restrictions Per Provider Order: No     Mobility Bed Mobility Overal bed mobility: Modified Independent             General bed mobility comments: Increased time and effort    Transfers Overall transfer level: Needs assistance Equipment used: Rolling walker (2 wheels) Transfers: Sit to/from Stand Sit to Stand: Supervision           General transfer comment: verbal cueing for safe use of RW      Balance Overall balance assessment: Mild deficits observed, not formally tested, Needs assistance Sitting-balance support: Bilateral upper extremity supported Sitting balance-Leahy Scale: Good     Standing balance support: Bilateral upper extremity supported, During functional activity Standing balance-Leahy Scale: Fair Standing balance comment: pt reports that I don't feel steady.  ADL either performed or assessed with clinical judgement   ADL Overall ADL's : Needs assistance/impaired Eating/Feeding: Supervision/ safety Eating/Feeding Details (indicate cue type and reason): IND Grooming: Wash/dry face;Oral care Grooming Details (indicate cue type and reason): standing at sink. Requires SUPV for safety due to tremulousness. Able to open and close toothpaste container w/ increased time and  effort, struggles to place toothpaste on brush (some spillage of paste)         Upper Body Dressing : Modified independent;Sitting                           Vision         Perception         Praxis         Pertinent Vitals/Pain Pain Assessment Pain Assessment: No/denies pain     Extremity/Trunk Assessment Upper Extremity Assessment Upper Extremity Assessment: Overall WFL for tasks assessed;RUE deficits/detail;LUE deficits/detail RUE Deficits / Details: b/l hand tremor LUE Deficits / Details: b/l hand tremor   Lower Extremity Assessment Lower Extremity Assessment: Overall WFL for tasks assessed       Communication Communication Communication: No apparent difficulties   Cognition Arousal: Lethargic Behavior During Therapy: Anxious, Flat affect Cognition: No apparent impairments             OT - Cognition Comments: Pt unable to work remote control for TV and becomes agitated about this                 Following commands: Intact       Cueing  General Comments   Cueing Techniques: Verbal cues  scabs and redness, b/l LE   Exercises Other Exercises Other Exercises: Educ re: role of OT, POC, home safety, falls prevention, reducing alcohol  consumption   Shoulder Instructions      Home Living Family/patient expects to be discharged to:: Private residence Living Arrangements: Alone Available Help at Discharge: Family;Available PRN/intermittently Type of Home: House Home Access: Stairs to enter [employer] of Steps: 3-4 without railing afront; enters at back where there are 3-4 steps with somthing to hold onto   Home Layout: Multi-level;Laundry or work area in basement;Bed/bath upstairs   Alternate Level Stairs-Rails: Left Bathroom Shower/Tub: Chief Strategy Officer: Standard     Home Equipment: Agricultural consultant (2 wheels)          Prior Functioning/Environment Prior Level of Function :  Independent/Modified Independent;Driving             Mobility Comments: Pt reports he has a RW but generally walks w/o AD. He reports 1 fall in previous 6 months. ADLs Comments: IND, able to walk to 2nd floor of house and down to basement. No employed. States that he does not go grocery shopping -- instead has all his food delivered.    OT Problem List: Decreased activity tolerance;Impaired balance (sitting and/or standing);Decreased safety awareness   OT Treatment/Interventions: Self-care/ADL training;Therapeutic exercise;Patient/family education;Balance training;Energy conservation;Therapeutic activities;DME and/or AE instruction      OT Goals(Current goals can be found in the care plan section)   Acute Rehab OT Goals Patient Stated Goal: none stated OT Goal Formulation: With patient Time For Goal Achievement: 08/05/23 Potential to Achieve Goals: Good   OT Frequency:  Min 2X/week    Co-evaluation              AM-PAC OT 6 Clicks Daily Activity     Outcome Measure Help from another person eating meals?:  None Help from another person taking care of personal grooming?: A Little Help from another person toileting, which includes using toliet, bedpan, or urinal?: A Little Help from another person bathing (including washing, rinsing, drying)?: A Lot Help from another person to put on and taking off regular upper body clothing?: None Help from another person to put on and taking off regular lower body clothing?: A Lot 6 Click Score: 18   End of Session Equipment Utilized During Treatment: Rolling walker (2 wheels)  Activity Tolerance: Patient tolerated treatment well Patient left: in bed;with call bell/phone within reach  OT Visit Diagnosis: Unsteadiness on feet (R26.81);Muscle weakness (generalized) (M62.81)                Time: 8751-8697 OT Time Calculation (min): 14 min Charges:  OT General Charges $OT Visit: 1 Visit OT Evaluation $OT Eval Low Complexity: 1  Low OT Treatments $Self Care/Home Management : 8-22 mins Suzen Hock, PhD, MS, OTR/L 07/22/23, 1:23 PM

## 2023-07-23 DIAGNOSIS — F10231 Alcohol dependence with withdrawal delirium: Secondary | ICD-10-CM | POA: Diagnosis not present

## 2023-07-23 MED ORDER — PHENOBARBITAL 32.4 MG PO TABS
32.4000 mg | ORAL_TABLET | Freq: Three times a day (TID) | ORAL | Status: DC
  Administered 2023-07-25: 32.4 mg via ORAL
  Filled 2023-07-23: qty 1

## 2023-07-23 MED ORDER — PHENOBARBITAL 32.4 MG PO TABS
64.8000 mg | ORAL_TABLET | Freq: Three times a day (TID) | ORAL | Status: AC
  Administered 2023-07-23 – 2023-07-25 (×5): 64.8 mg via ORAL
  Filled 2023-07-23 (×5): qty 2

## 2023-07-23 NOTE — Plan of Care (Signed)

## 2023-07-23 NOTE — Discharge Instructions (Addendum)
 Intensive Outpatient Programs   [facility] [address] [place],  [place], [place] [phone]  [facility] (Inpatient and [phone] (Suboxone and Methadone) [address] [phone]  [facility]: [facility] Programs - Intensive Outpatient [address] [place], [place] [address], [place]  [phone]  [facility] (Outpatient, Inpatient, [facility] (Groups and Residental) (insurance only) [phone] [place], [place] [phone]   [facility] (for caregivers and family) [address] Amy Kansky [address] [place], [place] [phone]  Residential Treatment Programs  [facility](2 years) Residential: 65 days)[facility] [address] [place], [place], [place] [phone] or [phone]  [facility] [address] [place], [place], [place] [phone]  [facility] ([facility]) [address] [place], [place], [place] [phone] Admissions: 8am-3pm M-F  [facility] Program: Residential Program [phone] Days)             [facility]: [place]  [facility], [place], [place]  [phone] or [phone] in Hours over the weekend or by referral)  [facility] [address] [place], [place] [address] [phone] (Do virtual or phone assessment, offer transportation within 25 miles, have in patient and Outpatient options)   [facility] Crisis: [facility]:[phone] (for crisis  response 24 hours a day)     Agency Name: [facility] Address: [address], [place], [place] [address] Phone: [phone] Website: [email or web address].alamanceservices.org Service(s) Offered: Housing services, self-sufficiency, congregate meal program, and individual development account program.  Agency Name: [employer] Address: [address], [place], [place] [address] Phone: [phone] Email: [email or web address] Lapine .org Website: [email or web address].alliedchurches.org Service(s) Offered: Housing the homeless, feeding the hungry, Company secretary, job and education related services.  Agency Name: [facility] Address: [address] [place], [place] [address] Phone: [phone] Email: [email or web address] .org Service(s) Offered: Counseling, problem pregnancy, advocacy for Hispanics, limited emergency financial assistance.  Agency Name: Department [employer] Address: [address] [place], [place] [address] Phone: [phone] Website: [email or web address] Service(s) Offered: Child support services; child welfare services; SNAP; Medicaid; work first family assistance; and aid with fuel,  rent, food and medicine.  Agency Name: Holiday representative Address: [address], [place], [place] [address] Phone: [phone] or [phone] Email: [email or web address] Service(s) Offered: Family services and transient assistance; emergency food, fuel, clothing, limited furniture, utilities; budget counseling, general counseling; give a kid a coat; thrift store; Christmas food and toys. Utility assistance, food pantry, rental  assistance, life sustaining medicine

## 2023-07-23 NOTE — Evaluation (Signed)
 Physical Therapy Evaluation Patient Details Name: Lawrence Rowe MRN: [id number] DOB: 01-30-1970 Today's Date: 07/23/2023  History of Present Illness  Lawrence Rowe is a 53 y.o. male with medical history significant of hypertension, GERD, EtOH abuse who presents with tremors and signs of alcohol  withdrawal. Patient was with his social worker today who noted him to be tremulous and directed him to the emergency department. Patient apparently drinks daily.  No known history of cirrhosis.  Has been nonadherent to blood pressure medications.   Clinical Impression  Patient A&Ox4, denied pain. At baseline the pt stated he lives alone, has a RW that he can intermittently use, 1 fall in the last 6 months, performs his own ADLs/IADLs. He was up in the recliner, moving it around his room like a WC, able to stand from it unlocked, SUE on bed rail, CGA. He ambulated ~117ft without AD, did appear to have occasional unsteadiness, pt endorsed feeling this way as well. Resistant to education or use of RW or SPC however. Pt appears to be nearing his baseline level of functioning, would beneft from follow up therapy services to maximize safety and function.         If plan is discharge home, recommend the following: Assistance with cooking/housework;Assist for transportation;Help with stairs or ramp for entrance   Can travel by private vehicle        Equipment Recommendations Cane  Recommendations for Other Services       Functional Status Assessment Patient has had a recent decline in their functional status and demonstrates the ability to make significant improvements in function in a reasonable and predictable amount of time.     Precautions / Restrictions Precautions Precautions: Fall Recall of Precautions/Restrictions: Intact Restrictions Weight Bearing Restrictions Per Provider Order: No      Mobility  Bed Mobility               General bed mobility comments: pt up in  room at start/end of session    Transfers Overall transfer level: Needs assistance Equipment used: None Transfers: Sit to/from Stand Sit to Stand: Supervision           General transfer comment: pt gets up from unlocked chair (his choice due to rolling around his room with it) stabilizes on bedrail with SUE support    Ambulation/Gait Ambulation/Gait assistance: Supervision, Contact guard assist Gait Distance (Feet): 90 Feet Assistive device: None   Gait velocity: decreased     General Gait Details: intermittent use of hallway railing PRN per pt. resistant to use of RW or SPC despite education and encouragement  Stairs            Wheelchair Mobility     Tilt Bed    Modified Rankin (Stroke Patients Only)       Balance Overall balance assessment: Mild deficits observed, not formally tested, Needs assistance Sitting-balance support: Bilateral upper extremity supported Sitting balance-Leahy Scale: Good     Standing balance support: During functional activity, No upper extremity supported Standing balance-Leahy Scale: Good Standing balance comment: pt did endorse some unsteadiness                             Pertinent Vitals/Pain Pain Assessment Pain Assessment: No/denies pain    Home Living Family/patient expects to be discharged to:: Private residence Living Arrangements: Alone Available Help at Discharge: Family;Available PRN/intermittently Type of Home: House Home Access: Stairs to enter   [employer] of  Steps: 3-4 without railing afront; enters at back where there are 3-4 steps with somthing to hold onto   Home Layout: Multi-level;Laundry or work area in basement;Bed/bath upstairs Home Equipment: Agricultural consultant (2 wheels)      Prior Function Prior Level of Function : Independent/Modified Independent;Driving             Mobility Comments: Pt reports he has a RW but generally walks w/o AD. He reports 1 fall in previous  6 months. ADLs Comments: IND, able to walk to 2nd floor of house and down to basement. No employed. States that he does not go grocery shopping -- instead has all his food delivered.     Extremity/Trunk Assessment   Upper Extremity Assessment Upper Extremity Assessment: Overall WFL for tasks assessed RUE Deficits / Details: b/l hand tremor    Lower Extremity Assessment Lower Extremity Assessment: Generalized weakness       Communication        Cognition Arousal: Alert Behavior During Therapy: WFL for tasks assessed/performed   PT - Cognitive impairments: No apparent impairments                         Following commands: Intact       Cueing Cueing Techniques: Verbal cues     General Comments      Exercises     Assessment/Plan    PT Assessment Patient needs continued PT services  PT Problem List Decreased activity tolerance;Decreased balance;Decreased mobility       PT Treatment Interventions Balance training;DME instruction;Gait training;Neuromuscular re-education;Stair training;Functional mobility training;Patient/family education;Therapeutic activities;Therapeutic exercise    PT Goals (Current goals can be found in the Care Plan section)  Acute Rehab PT Goals Patient Stated Goal: to go home PT Goal Formulation: With patient Time For Goal Achievement: 08/06/23 Potential to Achieve Goals: Good    Frequency Min 1X/week     Co-evaluation               AM-PAC PT 6 Clicks Mobility  Outcome Measure Help needed turning from your back to your side while in a flat bed without using bedrails?: None Help needed moving from lying on your back to sitting on the side of a flat bed without using bedrails?: None Help needed moving to and from a bed to a chair (including a wheelchair)?: None Help needed standing up from a chair using your arms (e.g., wheelchair or bedside chair)?: None Help needed to walk in hospital room?: A Little Help needed  climbing 3-5 steps with a railing? : A Little 6 Click Score: 22    End of Session   Activity Tolerance: Patient tolerated treatment well Patient left: in chair;with call bell/phone within reach   PT Visit Diagnosis: Difficulty in walking, not elsewhere classified (R26.2);Other abnormalities of gait and mobility (R26.89);Muscle weakness (generalized) (M62.81)    Time: 9076-9066 PT Time Calculation (min) (ACUTE ONLY): 10 min   Charges:   PT Evaluation $PT Eval Low Complexity: 1 Low   PT General Charges $$ ACUTE PT VISIT: 1 Visit        Doyal Shams PT, DPT 11:35 AM,07/23/23

## 2023-07-23 NOTE — Progress Notes (Signed)
 PROGRESS NOTE    OLIVIA ROYSE  FMW:[id number] DOB: May 27, 1970 DOA: 07/20/2023 PCP: Pcp, No    Brief Narrative:   53 y.o. male with medical history significant of hypertension, GERD, EtOH abuse who presents with tremors and signs of alcohol  withdrawal.  Patient was with his social worker today who noted him to be tremulous and directed him to the emergency department.  Patient apparently drinks daily and last reported consumption of alcohol  was this morning.  Patient reports drinking 3 beers this morning.  No known history of cirrhosis.  Has been nonadherent to blood pressure medications.   On my evaluation patient is in bed.  He appears uncomfortable and tremulous.  He answers all questions appropriately.  Vital signs significant for hypertension and tachycardia.  Patient is saturating well on room air.   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  Patient reports drinking daily.  Reports drinking 3 beers on the morning of admission.  Is found to be tremulous with associated tachycardia and hypotension.  Received 2 doses of intravenous phenobarbital  in the emergency department with minimal change in tremors.  Improving.  Abdominal ultrasound indicates cirrhotic morphology of liver. MELD-Na score 18 confers 6% 82-month mortality Child Pugh score of 9, class B, 19% estimated 1 year mortality Plan: Still scoring on CIWA although overall downtrending Continue phenobarbital  taper, changed to p.o. Continue CIWA protocol Continue folate, B12, multivitamin Substance abuse resources per [facility] Anticipate discharge 7/15   Essential hypertension Continue home metoprolol  Continue losartan  As needed IV hydralazine    GERD Continue PPI  Anemia Thrombocytopenia Likely in the setting of chronic alcohol  use.   On B12 and folate.   Thrombocytopenia mild and likely  secondary to underlying cirrhosis.   No evidence of bleeding.   Continue to monitor.  Bilateral lower extremity rash Unclear etiology.  Appreciate wound care recommendations.  Will need outpatient referral to dermatology on DC.    DVT prophylaxis: Lovenox  Code Status: Full Family Communication: None Disposition Plan: Status is: Inpatient Remains inpatient appropriate because: Acute alcohol  withdrawal   Level of care: Telemetry Medical  Consultants:  None  Procedures:  None  Antimicrobials: None   Subjective: Seen and examined.  Tremors improving.  Objective: Vitals:   07/22/23 1936 07/23/23 0047 07/23/23 0603 07/23/23 0726  BP: 132/82 115/79 129/77 111/74  Pulse: 64 90 78 77  Resp: 16   18  Temp: 98.9 F (37.2 C)   98.8 F (37.1 C)  TempSrc: Oral     SpO2: 98%   98%  Weight:      Height:        Intake/Output Summary (Last 24 hours) at 07/23/2023 1409 Last data filed at 07/23/2023 1024 Gross per 24 hour  Intake 720 ml  Output --  Net 720 ml   Filed Weights   07/20/23 1533  Weight: 99.5 kg    Examination:  General: No acute distress HEENT: Normocephalic, atraumatic Neck, supple, trachea midline, no tenderness Heart: S1-S2, RRR, no murmurs, no pedal edema Lungs: Lungs clear.  Normal work of breathing.  Room air Abdomen: Soft, nontender, nondistended, positive bowel sounds Extremities: Normal, atraumatic, no clubbing or cyanosis, normal muscle tone Skin: Peeling rash on bilateral lower extremities Neurologic: Cranial nerves grossly intact, sensation intact, alert and oriented x3 Psychiatric: Flattened affect       Data Reviewed: I have personally reviewed following labs and imaging studies  CBC: Recent  Labs  Lab 07/20/23 1534 07/21/23 0501  WBC 7.2 6.3  NEUTROABS 5.3  --   HGB 12.1* 11.0*  HCT 36.3* 32.9*  MCV 96.3 96.2  PLT 73* 68*   Basic Metabolic Panel: Recent Labs  Lab 07/20/23 1534 07/21/23 0501  NA 140 138  K 4.1 3.6  CL  104 105  CO2 22 22  GLUCOSE 128* 111*  BUN 11 10  CREATININE 0.82 0.63  CALCIUM 9.3 8.6*   GFR: Estimated Creatinine Clearance: 127.5 mL/min (by C-G formula based on SCr of 0.63 mg/dL). Liver Function Tests: Recent Labs  Lab 07/20/23 1534 07/21/23 0501  AST 96* 72*  ALT 41 34  ALKPHOS 72 63  BILITOT 3.9* 3.3*  PROT 8.0 6.8  ALBUMIN 3.9 3.3*   No results for input(s): LIPASE, AMYLASE in the last 168 hours. No results for input(s): AMMONIA in the last 168 hours. Coagulation Profile: Recent Labs  Lab 07/21/23 0501  INR 1.8*   Cardiac Enzymes: No results for input(s): CKTOTAL, CKMB, CKMBINDEX, TROPONINI in the last 168 hours. BNP (last 3 results) No results for input(s): PROBNP in the last 8760 hours. HbA1C: No results for input(s): HGBA1C in the last 72 hours. CBG: No results for input(s): GLUCAP in the last 168 hours. Lipid Profile: No results for input(s): CHOL, HDL, LDLCALC, TRIG, CHOLHDL, LDLDIRECT in the last 72 hours. Thyroid  Function Tests: No results for input(s): TSH, T4TOTAL, FREET4, T3FREE, THYROIDAB in the last 72 hours. Anemia Panel: No results for input(s): VITAMINB12, FOLATE, FERRITIN, TIBC, IRON, RETICCTPCT in the last 72 hours. Sepsis Labs: No results for input(s): PROCALCITON, LATICACIDVEN in the last 168 hours.  No results found for this or any previous visit (from the past 240 hours).       Radiology Studies: No results found.       Scheduled Meds:  enoxaparin  (LOVENOX ) injection  40 mg Subcutaneous Q24H   folic acid   1 mg Oral Daily   losartan   50 mg Oral Daily   metoprolol  succinate  50 mg Oral Daily   multivitamin with minerals  1 tablet Oral Daily   mupirocin  ointment   Topical BID   pantoprazole   40 mg Oral Daily   PHENObarbital   65 mg Intravenous Q8H   Followed by   NOREEN ON 07/25/2023] PHENObarbital   32.5 mg Intravenous Q8H   thiamine   100 mg Oral Daily   Or    thiamine   100 mg Intravenous Daily   Continuous Infusions:     LOS: 3 days     Calvin KATHEE Robson, MD [facility]   If 7PM-7AM, please contact night-coverage  07/23/2023, 2:09 PM

## 2023-07-23 NOTE — Plan of Care (Signed)
  Problem: Education: Goal: Knowledge of General Education information will improve Description: Including pain rating scale, medication(s)/side effects and non-pharmacologic comfort measures Outcome: Progressing   Problem: Activity: Goal: Risk for activity intolerance will decrease Outcome: Progressing   Problem: Safety: Goal: Ability to remain free from injury will improve Outcome: Progressing   Problem: Health Behavior/Discharge Planning: Goal: Ability to manage health-related needs will improve Outcome: Progressing   Problem: Coping: Goal: Level of anxiety will decrease Outcome: Progressing   Problem: Pain Managment: Goal: General experience of comfort will improve and/or be controlled Outcome: Progressing   Problem: Skin Integrity: Goal: Risk for impaired skin integrity will decrease Outcome: Progressing

## 2023-07-23 NOTE — TOC Initial Note (Signed)
 Transition of Care [facility]) - Initial/Assessment Note    Patient Details  Name: Lawrence Rowe MRN: [id number] Date of Birth: 1970/11/02  Transition of Care [facility]) CM/SW Contact:    Corean ONEIDA Haddock, RN Phone Number: 07/23/2023, 10:18 AM  Clinical Narrative:                      Admitted for:Alcohol  withdrawal delirium, acute, hypoactive  Admitted from: patient states he typically lives at home alone, but that at discharge he will be staying with his mother, and his mother will transport at discharge PCP: no PCP.  List of local PCP added to AVS Current home health/prior home health/DME: RW  Therapy recommending home health PT.  Due to patients payor unable to secure home health services.  Patient agreeable to outpatient therapy.  States he does not have a preference of location.  Referral sent to [facility] outpatient therapy.  Patient agreeable to cane.  Referral made to [facility] with [employer], to be delivered to room  Substance abuse resources added to AVS. Utilities resources added to AVS  Update: patient received RW in dec 2024, and is not eligible for cane, patient update and to purchase post dc  Patient Goals and CMS Choice            Expected Discharge Plan and Services                                              Prior Living Arrangements/Services                       Activities of Daily Living      Permission Sought/Granted                  Emotional Assessment              Admission diagnosis:  Alcohol  withdrawal syndrome with complication (HCC) [F10.939] Alcohol  withdrawal delirium, acute, hypoactive (HCC) [F10.231] Patient Active Problem List   Diagnosis Date Noted   Alcohol  withdrawal delirium, acute, hypoactive (HCC) 07/20/2023   Elevated LFTs 12/22/2022   Serum total bilirubin elevated 12/22/2022   GERD (gastroesophageal reflux disease) 12/22/2022   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   Lactic acidosis 06/14/2022   Alcohol   withdrawal with inpatient treatment (HCC) 06/08/2022   High anion gap metabolic acidosis 06/08/2022   Chronic diastolic CHF (congestive heart failure) (HCC) 01/13/2022   Overweight (BMI 25.0-29.9) 01/12/2022   Fatty liver 01/12/2022   Abnormal liver function 01/11/2022   Wernicke encephalopathy syndrome 12/27/2021   Delirium tremens (HCC) 12/27/2021   Alcoholic hepatitis 12/27/2021   Alcohol  withdrawal (HCC) 12/23/2021   Transaminitis 12/23/2021   Unilateral edema of lower extremity 12/23/2021   Dermatitis 05/10/2021   Primary hypertension 05/10/2021   Tachycardia with hypertension 04/19/2021   PAD (peripheral artery disease) (HCC) 04/19/2021   Alcohol  abuse 04/19/2021   Metabolic syndrome 04/19/2021   PCP:  Freddrick, No Pharmacy:   [facility] - [place], [place] - [address] [place] [address] Phone: [phone] Fax: [phone]  [facility] [address] [place] [address] Phone: [phone] Fax: [phone]     Social Drivers of Health (SDOH) Social History: SDOH Screenings   Food Insecurity: No Food Insecurity (07/21/2023)  Housing: Low Risk  (07/21/2023)  Transportation Needs:  No Transportation Needs (07/21/2023)  Utilities: At Risk (07/21/2023)  Depression (PHQ2-9): Low Risk  (04/19/2021)  Tobacco Use: Medium Risk (07/20/2023)   SDOH Interventions:     Readmission Risk Interventions     No data to display

## 2023-07-23 NOTE — Progress Notes (Signed)
     [facility] REFERRAL        Occupational Therapy * Physical Therapy * Speech Therapy                           DATE 07/23/23   PATIENT NAME  Lawrence Rowe PATIENT MRN [id number]        DIAGNOSIS/DIAGNOSIS CODE Alcohol  withdrawal delirium, acute, hypoactive   DATE OF DISCHARGE: Planned for 7/15       PRIMARY CARE PHYSICIAN    no PCP  PCP PHONE/FAX      Dear Provider (Name: [facility] outpatient __  Fax: [phone]   I certify that I have examined this patient and that occupational/physical/speech therapy is necessary on an outpatient basis.    The patient has expressed interest in completing their recommended course of therapy at your  location.  Once a formal order from the patient's primary care physician has been obtained, please  contact him/her to schedule an appointment for evaluation at your earliest convenience.   [x ]  Physical Therapy Evaluate and Treat  [  ]  Occupational Therapy Evaluate and Treat  [  ]  Speech Therapy Evaluate and Treat         The patient's primary care physician (listed above) must furnish and be responsible for a formal order such that the recommended services may be furnished while under the primary physician's care, and that the plan of care will be established and reviewed every 30 days (or more often if condition necessitates).

## 2023-07-24 DIAGNOSIS — F10231 Alcohol dependence with withdrawal delirium: Secondary | ICD-10-CM | POA: Diagnosis not present

## 2023-07-24 MED ORDER — TRAZODONE HCL 50 MG PO TABS
25.0000 mg | ORAL_TABLET | Freq: Every evening | ORAL | Status: DC | PRN
  Administered 2023-07-25: 25 mg via ORAL
  Filled 2023-07-24: qty 1

## 2023-07-24 NOTE — Plan of Care (Signed)

## 2023-07-24 NOTE — Progress Notes (Signed)
 Mobility Specialist - Progress Note   07/24/23 0943  Mobility  Activity Ambulated with assistance in hallway  Level of Assistance Standby assist, set-up cues, supervision of patient - no hands on  Assistive Device None  Distance Ambulated (ft) 160 ft  Activity Response Tolerated well  Mobility visit 1 Mobility  Mobility Specialist Start Time (ACUTE ONLY) 0931  Mobility Specialist Stop Time (ACUTE ONLY) 0941  Mobility Specialist Time Calculation (min) (ACUTE ONLY) 10 min   Pt sitting in the recliner upon entry, utilizing RA. Pt agreeable to amb within the hallway, denies use of AD. Pt STS and amb one lap around the NS with close supervision, tolerated well-- no LOB, Pt expressed feeling more supportive and better this date. Pt returned to the room, left seated in the recliner with needs within reach. RN notified.  America Silvan Mobility Specialist 07/24/23 10:15 AM

## 2023-07-24 NOTE — Progress Notes (Signed)
 I have reviewed and concur with this student's documentation.   Carola Beal, RN 07/24/2023 2:08 PM

## 2023-07-24 NOTE — Progress Notes (Signed)
 PROGRESS NOTE    Lawrence Rowe  FMW:[id number] DOB: Jul 08, 1970 DOA: 07/20/2023 PCP: Pcp, No    Brief Narrative:   53 y.o. male with medical history significant of hypertension, GERD, EtOH abuse who presents with tremors and signs of alcohol  withdrawal.  Patient was with his social worker today who noted him to be tremulous and directed him to the emergency department.  Patient apparently drinks daily and last reported consumption of alcohol  was this morning.  Patient reports drinking 3 beers this morning.  No known history of cirrhosis.  Has been nonadherent to blood pressure medications.   On my evaluation patient is in bed.  He appears uncomfortable and tremulous.  He answers all questions appropriately.  Vital signs significant for hypertension and tachycardia.  Patient is saturating well on room air.   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  Patient reports drinking daily.  Reports drinking 3 beers on the morning of admission.  Is found to be tremulous with associated tachycardia and hypotension.  Received 2 doses of intravenous phenobarbital  in the emergency department with minimal change in tremors.  Improving.  Abdominal ultrasound indicates cirrhotic morphology of liver. MELD-Na score 18 confers 6% 36-month mortality Child Pugh score of 9, class B, 19% estimated 1 year mortality Plan: Still scoring on CIWA although overall downtrending Continue p.o. phenobarbital  taper Continue CIWA protocol Continue folate, B12, multivitamin Substance abuse resources per [facility] Anticipate discharge 7/16   Essential hypertension Continue home metoprolol  Continue losartan  As needed IV hydralazine    GERD Continue PPI  Anemia Thrombocytopenia Likely in the setting of chronic alcohol  use.   On B12 and folate.   Thrombocytopenia mild and likely secondary to  underlying cirrhosis.   No evidence of bleeding.   Continue to monitor.  Bilateral lower extremity rash Unclear etiology.  Appreciate wound care recommendations.  Will need outpatient referral to dermatology on DC.  Ambulatory referral placed    DVT prophylaxis: Lovenox  Code Status: Full Family Communication: None Disposition Plan: Status is: Inpatient Remains inpatient appropriate because: Acute alcohol  withdrawal   Level of care: Telemetry Medical  Consultants:  None  Procedures:  None  Antimicrobials: None   Subjective: Seen and examined.  Appears a bit unsteady but overall stable.  No pain complaints.  Objective: Vitals:   07/23/23 1630 07/23/23 1938 07/24/23 0231 07/24/23 0730  BP: 126/76 127/74 119/65 131/69  Pulse: 64 (!) 59 67 70  Resp: 16 18 16 18   Temp: 98.1 F (36.7 C) 98.2 F (36.8 C) (!) 97.5 F (36.4 C) 97.6 F (36.4 C)  TempSrc:  Oral Oral   SpO2: 94% 96% 91% 98%  Weight:      Height:        Intake/Output Summary (Last 24 hours) at 07/24/2023 1340 Last data filed at 07/23/2023 2200 Gross per 24 hour  Intake 600 ml  Output --  Net 600 ml   Filed Weights   07/20/23 1533  Weight: 99.5 kg    Examination:  General: No acute distress, appears fatigued HEENT: Normocephalic, atraumatic Neck, supple, trachea midline, no tenderness Heart: S1-S2, RRR, no murmurs, no pedal edema Lungs: Lungs clear.  Normal work of breathing.  Room air Abdomen: Soft, nontender, nondistended, positive bowel sounds Extremities: Normal, atraumatic, no clubbing or cyanosis, normal muscle tone Skin: Peeling rash on bilateral lower extremities Neurologic: Cranial nerves grossly intact, sensation intact, alert and oriented x3 Psychiatric: Flattened  affect       Data Reviewed: I have personally reviewed following labs and imaging studies  CBC: Recent Labs  Lab 07/20/23 1534 07/21/23 0501  WBC 7.2 6.3  NEUTROABS 5.3  --   HGB 12.1* 11.0*  HCT 36.3* 32.9*   MCV 96.3 96.2  PLT 73* 68*   Basic Metabolic Panel: Recent Labs  Lab 07/20/23 1534 07/21/23 0501  NA 140 138  K 4.1 3.6  CL 104 105  CO2 22 22  GLUCOSE 128* 111*  BUN 11 10  CREATININE 0.82 0.63  CALCIUM 9.3 8.6*   GFR: Estimated Creatinine Clearance: 127.5 mL/min (by C-G formula based on SCr of 0.63 mg/dL). Liver Function Tests: Recent Labs  Lab 07/20/23 1534 07/21/23 0501  AST 96* 72*  ALT 41 34  ALKPHOS 72 63  BILITOT 3.9* 3.3*  PROT 8.0 6.8  ALBUMIN 3.9 3.3*   No results for input(s): LIPASE, AMYLASE in the last 168 hours. No results for input(s): AMMONIA in the last 168 hours. Coagulation Profile: Recent Labs  Lab 07/21/23 0501  INR 1.8*   Cardiac Enzymes: No results for input(s): CKTOTAL, CKMB, CKMBINDEX, TROPONINI in the last 168 hours. BNP (last 3 results) No results for input(s): PROBNP in the last 8760 hours. HbA1C: No results for input(s): HGBA1C in the last 72 hours. CBG: No results for input(s): GLUCAP in the last 168 hours. Lipid Profile: No results for input(s): CHOL, HDL, LDLCALC, TRIG, CHOLHDL, LDLDIRECT in the last 72 hours. Thyroid  Function Tests: No results for input(s): TSH, T4TOTAL, FREET4, T3FREE, THYROIDAB in the last 72 hours. Anemia Panel: No results for input(s): VITAMINB12, FOLATE, FERRITIN, TIBC, IRON, RETICCTPCT in the last 72 hours. Sepsis Labs: No results for input(s): PROCALCITON, LATICACIDVEN in the last 168 hours.  No results found for this or any previous visit (from the past 240 hours).       Radiology Studies: No results found.       Scheduled Meds:  enoxaparin  (LOVENOX ) injection  40 mg Subcutaneous Q24H   folic acid   1 mg Oral Daily   losartan   50 mg Oral Daily   metoprolol  succinate  50 mg Oral Daily   multivitamin with minerals  1 tablet Oral Daily   mupirocin  ointment   Topical BID   pantoprazole   40 mg Oral Daily   phenobarbital   64.8 mg  Oral Q8H   Followed by   NOREEN ON 07/25/2023] phenobarbital   32.4 mg Oral Q8H   thiamine   100 mg Oral Daily   Or   thiamine   100 mg Intravenous Daily   Continuous Infusions:     LOS: 4 days     Calvin KATHEE Robson, MD [facility]   If 7PM-7AM, please contact night-coverage  07/24/2023, 1:40 PM

## 2023-07-24 NOTE — Plan of Care (Signed)

## 2023-07-25 DIAGNOSIS — F10231 Alcohol dependence with withdrawal delirium: Secondary | ICD-10-CM | POA: Diagnosis not present

## 2023-07-25 LAB — COMPREHENSIVE METABOLIC PANEL WITH GFR
ALT: 28 U/L (ref 0–44)
AST: 48 U/L — ABNORMAL HIGH (ref 15–41)
Albumin: 3.5 g/dL (ref 3.5–5.0)
Alkaline Phosphatase: 74 U/L (ref 38–126)
Anion gap: 8 (ref 5–15)
BUN: 15 mg/dL (ref 6–20)
CO2: 22 mmol/L (ref 22–32)
Calcium: 8.8 mg/dL — ABNORMAL LOW (ref 8.9–10.3)
Chloride: 108 mmol/L (ref 98–111)
Creatinine, Ser: 0.75 mg/dL (ref 0.61–1.24)
GFR, Estimated: >60 mL/min (ref >60)
Glucose, Bld: 147 mg/dL — ABNORMAL HIGH (ref 70–99)
Potassium: 3.9 mmol/L (ref 3.5–5.1)
Sodium: 138 mmol/L (ref 135–145)
Total Bilirubin: 1.6 mg/dL — ABNORMAL HIGH (ref 0.0–1.2)
Total Protein: 7.4 g/dL (ref 6.5–8.1)

## 2023-07-25 LAB — CBC
HCT: 35.7 % — ABNORMAL LOW (ref 39.0–52.0)
Hemoglobin: 12 g/dL — ABNORMAL LOW (ref 13.0–17.0)
MCH: 32.2 pg (ref 26.0–34.0)
MCHC: 33.6 g/dL (ref 30.0–36.0)
MCV: 95.7 fL (ref 80.0–100.0)
Platelets: 115 K/uL — ABNORMAL LOW (ref 150–400)
RBC: 3.73 MIL/uL — ABNORMAL LOW (ref 4.22–5.81)
RDW: 16.7 % — ABNORMAL HIGH (ref 11.5–15.5)
WBC: 6.1 K/uL (ref 4.0–10.5)
nRBC: 0 % (ref 0.0–0.2)

## 2023-07-25 MED ORDER — VITAMIN B-1 100 MG PO TABS: 100.0000 mg | ORAL_TABLET | Freq: Every day | ORAL | 0 refills | Status: DC

## 2023-07-25 MED ORDER — ADULT MULTIVITAMIN W/MINERALS CH: 1.0000 | ORAL_TABLET | Freq: Every day | ORAL | 0 refills | Status: AC

## 2023-07-25 MED ORDER — FOLIC ACID 1 MG PO TABS: 1.0000 mg | ORAL_TABLET | Freq: Every day | ORAL | 0 refills | Status: DC

## 2023-07-25 MED ORDER — PHENOBARBITAL 32.4 MG PO TABS: 32.4000 mg | ORAL_TABLET | Freq: Three times a day (TID) | ORAL | 0 refills | Status: DC

## 2023-07-25 MED ORDER — PANTOPRAZOLE SODIUM 40 MG PO TBEC: 40.0000 mg | DELAYED_RELEASE_TABLET | Freq: Every day | ORAL | 0 refills | Status: DC

## 2023-07-25 NOTE — Discharge Summary (Signed)
 Physician Discharge Summary   Patient: Lawrence Rowe MRN: [id number] DOB: 06-May-1970  Admit date:     07/20/2023  Discharge date: 07/25/23  Discharge Physician: Delon Herald   PCP: Pcp, No   Recommendations at discharge:   You are being discharged to home with Camie Misty STOP drinking; resuming alcohol  will place you at significant risk of injury/illness/death Continue to take thiamine , folate, multivitamin daily Take phenobarbital  every 8 hours for 4 more doses Follow up with PCP; referral made Follow up with dermatology; referral made Continue wound care as advised You are being referred to outpatient physical therapy  Discharge Diagnoses: Principal Problem:   Alcohol  withdrawal delirium, acute, hypoactive [facility])    Hospital Course: 53yo with h/o HTN, GERD, and ETOH use d/o who presented on 7/11 with ETOH withdrawal.  He was treated with phenobarbital  taper with improvement.  Assessment and Plan:  Acute alcohol  withdrawal EtOH level negative on presentation Patient reports drinking daily, including 3 beers on the morning of admission Received 2 doses of intravenous phenobarbital  in the emergency department with minimal change in tremors Continue p.o. phenobarbital  taper Continue folate, B12, multivitamin Substance abuse resources per [facility] He is medically stable for discharge  Alcoholic cirrhosis  Abdominal ultrasound indicates cirrhotic morphology of liver MELD-Na score 18 confers 6% 46-month mortality Child Pugh score of 9, class B, 19% estimated 1 year mortality Thrombocytopenia and elevated INR are related to cirrhosis Strict ETOH cessation is encouraged  Essential hypertension Continue metoprolol  He is no longer taking losartan    GERD Continue PPI   Anemia/Thrombocytopenia In the setting of chronic alcohol  use.   On B12 and folate Thrombocytopenia mild and likely secondary to underlying cirrhosis No evidence of bleeding   Bilateral lower  extremity rash Appreciate wound care recommendations Will need outpatient referral to dermatology   Consultants: PT OT Wound care TOC team  Procedures: None  Antibiotics: None  30 Day Unplanned Readmission Risk Score    Flowsheet Row ED to Hosp-Admission (Current) from 07/20/2023 in [facility]  30 Day Unplanned Readmission Risk Score (%) 13.51 Filed at 07/25/2023 0801    This score is the patient's risk of an unplanned readmission within 30 days of being discharged (0 -100%). The score is based on dignosis, age, lab data, medications, orders, and past utilization.   Low:  0-14.9   Medium: 15-21.9   High: 22-29.9   Extreme: 30 and above           Pain control - [place]  Controlled Substance Reporting System database was reviewed. and patient was instructed, not to drive, operate heavy machinery, perform activities at heights, swimming or participation in water activities or provide baby-sitting services while on Pain, Sleep and Anxiety Medications; until their outpatient Physician has advised to do so again. Also recommended to not to take more than prescribed Pain, Sleep and Anxiety Medications.   Disposition: Home Diet recommendation:  Regular diet DISCHARGE MEDICATION: Allergies as of 07/25/2023       Reactions   Penicillins Other (See Comments)        Medication List     STOP taking these medications    losartan  50 MG tablet Commonly known as: COZAAR    omeprazole  10 MG capsule Commonly known as: PRILOSEC Replaced by: pantoprazole  40 MG tablet   thiamine  100 MG tablet Commonly known as: VITAMIN B1       TAKE these medications    folic acid  1 MG tablet Commonly known as: FOLVITE  Take 1  tablet (1 mg total) by mouth daily.   metoprolol  succinate 50 MG 24 hr tablet Commonly known as: TOPROL -XL Take 1 tablet (50 mg total) by mouth daily. Take with or immediately following a meal.   multivitamin with  minerals Tabs tablet Take 1 tablet by mouth daily.   pantoprazole  40 MG tablet Commonly known as: PROTONIX  Take 1 tablet (40 mg total) by mouth daily. Replaces: omeprazole  10 MG capsule   PHENobarbital  32.4 MG tablet Commonly known as: LUMINAL Take 1 tablet (32.4 mg total) by mouth every 8 (eight) hours for 4 doses.   thiamine  100 MG tablet Commonly known as: Vitamin B-1 Take 1 tablet (100 mg total) by mouth daily.               Durable Medical Equipment  (From admission, onward)           Start     Ordered   07/23/23 1013  For home use only DME Cane  Once        07/23/23 1012              Discharge Care Instructions  (From admission, onward)           Start     Ordered   07/25/23 0000  Discharge wound care:       Comments: Cleanse any open wounds to B lower legs with NS and apply Mupirocin  ointment 2 times daily to wound beds and secure with silicone foam or dry gauze and clothe tape   07/25/23 1433            Discharge Exam:   Subjective: Reports some ongoing confusion.  He is uncertain about whether he wants to stop drinking, will not go to halfway house program.  Ex-girlfriend is present, reports that his home is uninhabitable and she will send him to a hotel while she gets it cleaned up.   Objective: Vitals:   07/25/23 0633 07/25/23 0750  BP: (!) 102/49 109/70  Pulse: (!) 59 76  Resp: 18 16  Temp: 98.5 F (36.9 C) 97.7 F (36.5 C)  SpO2: 97% 97%    Intake/Output Summary (Last 24 hours) at 07/25/2023 1433 Last data filed at 07/25/2023 0900 Gross per 24 hour  Intake 720 ml  Output --  Net 720 ml   Filed Weights   07/20/23 1533  Weight: 99.5 kg    Exam:  General:  Appears calm and comfortable and is in NAD Eyes:  normal lids, iris ENT:  grossly normal hearing, lips & tongue, mmm Cardiovascular:  RRR.   Respiratory:   CTA bilaterally with no wheezes/rales/rhonchi.  Normal respiratory effort. Abdomen:  soft, NT, ND Skin:   BLE wraps in place Musculoskeletal:  grossly normal tone BUE/BLE, good ROM, no bony abnormality Psychiatric:  eccentric mood and affect, speech fluent and appropriate, AOx3 Neurologic:  CN 2-12 grossly intact, moves all extremities in coordinated fashion  Data Reviewed: I have reviewed the patient's lab results since admission.  Pertinent labs for today include:   Glucose 111 Albumin 3.3 AST 72/ALT 34/Bili 3.3, improving WBC 6.3 Hgb 11 Platelets 68 INR 1.8    Condition at discharge: fair  The results of significant diagnostics from this hospitalization (including imaging, microbiology, ancillary and laboratory) are listed below for reference.   Imaging Studies: US  ABDOMEN LIMITED RUQ (LIVER/GB) Result Date: 07/20/2023 CLINICAL DATA:  Transaminitis. EXAM: ULTRASOUND ABDOMEN LIMITED RIGHT UPPER QUADRANT COMPARISON:  January 11, 2022.  June 19, 2022. FINDINGS: Gallbladder: No gallstones  or wall thickening visualized. No sonographic Murphy sign noted by sonographer. Common bile duct: Diameter: 5 mm which is within normal limits. Liver: Slightly nodular hepatic contour is noted suggesting hepatic cirrhosis. No definite focal sonographic hepatic abnormality is noted. Portal vein is patent on color Doppler imaging with normal direction of blood flow towards the liver. Other: Small amount of free fluid is noted around the liver. IMPRESSION: Probable hepatic cirrhosis with small amount of fluid noted around the liver. Electronically Signed   By: Lynwood Landy Raddle M.D.   On: 07/20/2023 17:40    Microbiology: Results for orders placed or performed during the hospital encounter of 09/25/21  SARS Coronavirus 2 by RT PCR (hospital order, performed in [facility] hospital lab) *cepheid single result test* Anterior Nasal Swab     Status: None   Collection Time: 09/25/21 11:43 AM   Specimen: Anterior Nasal Swab  Result Value Ref Range Status   SARS Coronavirus 2 by RT PCR NEGATIVE NEGATIVE Final     Comment: (NOTE) SARS-CoV-2 target nucleic acids are NOT DETECTED.  The SARS-CoV-2 RNA is generally detectable in upper and lower respiratory specimens during the acute phase of infection. The lowest concentration of SARS-CoV-2 viral copies this assay can detect is 250 copies / mL. A negative result does not preclude SARS-CoV-2 infection and should not be used as the sole basis for treatment or other patient management decisions.  A negative result may occur with improper specimen collection / handling, submission of specimen other than nasopharyngeal swab, presence of viral mutation(s) within the areas targeted by this assay, and inadequate number of viral copies (<250 copies / mL). A negative result must be combined with clinical observations, patient history, and epidemiological information.  Fact Sheet for Patients:   RoadLapTop.co.za  Fact Sheet for Healthcare Providers: http://kim-miller.com/  This test is not yet approved or  cleared by the [place]  FDA and has been authorized for detection and/or diagnosis of SARS-CoV-2 by FDA under an Emergency Use Authorization (EUA).  This EUA will remain in effect (meaning this test can be used) for the duration of the COVID-19 declaration under Section 564(b)(1) of the Act, 21 U.S.C. section 360bbb-3(b)(1), unless the authorization is terminated or revoked sooner.  Performed at [facility], [address] [place], [place] [address]     Labs: CBC: Recent Labs  Lab 07/20/23 1534 07/21/23 0501 07/25/23 0903  WBC 7.2 6.3 6.1  NEUTROABS 5.3  --   --   HGB 12.1* 11.0* 12.0*  HCT 36.3* 32.9* 35.7*  MCV 96.3 96.2 95.7  PLT 73* 68* 115*   Basic Metabolic Panel: Recent Labs  Lab 07/20/23 1534 07/21/23 0501 07/25/23 0903  NA 140 138 138  K 4.1 3.6 3.9  CL 104 105 108  CO2 22 22 22   GLUCOSE 128* 111* 147*  BUN 11 10 15   CREATININE 0.82 0.63 0.75  CALCIUM 9.3 8.6*  8.8*   Liver Function Tests: Recent Labs  Lab 07/20/23 1534 07/21/23 0501 07/25/23 0903  AST 96* 72* 48*  ALT 41 34 28  ALKPHOS 72 63 74  BILITOT 3.9* 3.3* 1.6*  PROT 8.0 6.8 7.4  ALBUMIN 3.9 3.3* 3.5   CBG: No results for input(s): GLUCAP in the last 168 hours.  Discharge time spent: greater than 30 minutes.  Signed: Delon Herald, MD [facility] 07/25/2023

## 2023-07-25 NOTE — Plan of Care (Signed)
  Problem: Skin Integrity: Goal: Risk for impaired skin integrity will decrease Outcome: Progressing   Problem: Coping: Goal: Level of anxiety will decrease Outcome: Progressing   Problem: Health Behavior/Discharge Planning: Goal: Ability to manage health-related needs will improve Outcome: Progressing

## 2023-07-25 NOTE — TOC Transition Note (Signed)
 Transition of Care [facility]) - Discharge Note   Patient Details  Name: Lawrence Rowe MRN: [id number] Date of Birth: 1970-05-14  [facility] CM/SW Contact:  Corean ONEIDA Haddock, RN Phone Number: 07/25/2023, 3:41 PM   Clinical Narrative:     Patient to discharge today.  No further toc needs identified          Patient Goals and CMS Choice            Discharge Placement                       Discharge Plan and Services Additional resources added to the After Visit Summary for                                       Social Drivers of Health (SDOH) Interventions SDOH Screenings   Food Insecurity: No Food Insecurity (07/21/2023)  Housing: Low Risk  (07/21/2023)  Transportation Needs: No Transportation Needs (07/21/2023)  Utilities: At Risk (07/21/2023)  Depression (PHQ2-9): Low Risk  (04/19/2021)  Tobacco Use: Medium Risk (07/20/2023)     Readmission Risk Interventions     No data to display

## 2023-07-25 NOTE — Hospital Course (Signed)
 53yo with h/o HTN, GERD, and ETOH use d/o who presented on 7/11 with ETOH withdrawal.  He was treated with phenobarbital  taper with improvement.

## 2023-07-25 NOTE — Progress Notes (Signed)
 Pt ex-girlfriend presenting back and forth onto hall since pt discharge with crying episodes and agitation.  She expressed pt needing a shower prior to leaving with him to go home expressing concerns of his personal hygiene.  She expressed earlier during the shift when pt was initially discharged she was going to  remove his leg bandages and give patient a shower.  She agreed to call for nurse to provide wound care after shower then will leave to take patient to her home. She was expressing she was taking patient home with her because she didn't feel comfortable dropping him off at home but just wanted him clean prior to taking him home.  Discussed with her the patient was able to bath himself and at times have allowed staff to assist with his care and at times refused.  Later received report from assigned nurse the ex girlfriend/caregiver had left without patient stated was going to the house and return to get patient.  Upon ex-girlfriend return she was emotional and stating was uncomfortable with patient having urinary and bowel incontinence requesting staff to go give patient a shower.  When writer asked her about her assisting patient with shower prior to her leaving the second time she stated she had left and  under the impression the staff was going to shower him and he be ready when she return.  According to assigned nurse patient decline staff assisting with his shower and he was showering himself.  Writer later followed up in which entered room patient was standing in front of bathroom unclothed wiping himself down with a towel. Writer asked patient had he showered or needed help. He stated yes and no he didn't need any help. Ex-Girlfriend was not present in the room at that time. Ex-Girlfriend approached nurse station again about having to shower patient and not being comfortable taking patient home.  Asked her did she agree to take him home with the Case Manager and MD in which she stated she did. Writer  expressed that the MD had given orders to discharge and expecting patient to go home today hours ago off the basis of her agreeing to transport him home or with her. Patient was capable of bathing and going to the bathroom.  He has made the choice to not shower and his personal hygiene was like so prior to reporting to the hospital.  I suggested her to go to patient room and nurse will be into help to take patient home.  She left the nursing station to the patient room.  Writer noted patient was still inpatient when entered room to follow up patient was in the shower and girlfriend was showering him again.  Pt shook his head pointing at the girlfriend.  She stated he was not clean enough to go to her home.  She expressed that she was not camping out or not trying to leave she just needed him to be clean before getting in the car and going home. Writer expressed understood and will allow time to finish just ensuring patient is leaving.  Approximately 15 minutes later when entered room patient was in wheelchair she became loud and  aggressive expressing she was being treated as if she didn't have anywhere to go she wanted to let me know she has a nice big home that she lives in and not trying to camp out or not go home.  Writer expressed understanding however room assignment has been placed off basis of patient being discharged hours ago.  Then redirected the conversation to being ready to leave and obtaining a staff member to assist pushing patient out. As Clinical research associate was walking away from door that was blocked by wheelchair she yelled  I know you not walking away from me and not going to listen writer replied was going to get someone to help her couldn't come in the room because the wheelchair blocking the door. Girlfriend continue to approach directly anxious with elevated tone to Clinical research associate and assigned nurse. Assigned nurse agreed to assist taken her and patient down. security called to assist with departure due to the  ex-girlfriend's agitation with staff.  Ex-Girlfriend stated she didn't need security or staff to assist her left floor with patient belongings in wheelchair.

## 2023-08-02 ENCOUNTER — Encounter: Payer: Self-pay | Admitting: Dermatology

## 2023-08-02 ENCOUNTER — Ambulatory Visit: Payer: MEDICAID | Admitting: Dermatology

## 2023-08-02 DIAGNOSIS — Z7189 Other specified counseling: Secondary | ICD-10-CM | POA: Diagnosis not present

## 2023-08-02 DIAGNOSIS — Z79899 Other long term (current) drug therapy: Secondary | ICD-10-CM

## 2023-08-02 DIAGNOSIS — L409 Psoriasis, unspecified: Secondary | ICD-10-CM

## 2023-08-02 DIAGNOSIS — D229 Melanocytic nevi, unspecified: Secondary | ICD-10-CM

## 2023-08-02 DIAGNOSIS — D225 Melanocytic nevi of trunk: Secondary | ICD-10-CM | POA: Diagnosis not present

## 2023-08-02 MED ORDER — TRIAMCINOLONE ACETONIDE 0.1 % EX CREA: TOPICAL_CREAM | CUTANEOUS | 2 refills | Status: DC

## 2023-08-02 NOTE — Progress Notes (Signed)
 Follow Up Visit   Subjective  Lawrence Rowe is a 53 y.o. male who presents for the following: Rash on legs, feet, arms. Dur: couple of years. Worsened recently while in hospital. Hospitalized 07/20/2023 for alcohol  withdrawal with delirium. Rash was present prior to hospitalization but worsened while he was bedridden. Discharged 07/25/2023.  Hx of incontinence. C/O rash/skin breakdown secondary to this.    Guardian, Camie Misty (former partner), is with patient and contributes to history.   The following portions of the chart were reviewed this encounter and updated as appropriate: medications, allergies, medical history  Review of Systems:  No other skin or systemic complaints except as noted in HPI or Assessment and Plan.  Objective  Well appearing patient in no apparent distress; mood and affect are within normal limits.  A focused examination was performed of the following areas: Face, legs, elbows, buttocks  Relevant exam findings are noted in the Assessment and Plan.                 Assessment & Plan   PSORIASIS   Related Procedures QuantiFERON-TB Gold Plus Hep B Surface Antibody Hep B Surface Antigen Hepatitis B core antibody, total Related Medications triamcinolone  cream (KENALOG ) 0.1 % Apply twice daily to affected areas until improved, for psoriasis. Avoid applying to face, groin, and axilla. ENCOUNTER FOR LONG-TERM (CURRENT) USE OF MEDICATIONS   Related Procedures QuantiFERON-TB Gold Plus Hep B Surface Antibody Hep B Surface Antigen Hepatitis B core antibody, total COUNSELING AND COORDINATION OF CARE   MEDICATION MANAGEMENT   MULTIPLE BENIGN NEVI    PSORIASIS Exam: Well-demarcated violaceous plaques with micaceous scale, guttate pink scaly papules at legs, feet, elbows, buttocks. 5-10% BSA.  Chronic and persistent condition with duration or expected duration over one year. Condition is bothersome/symptomatic for patient. Currently  flared.  Denies personal or family Hx of UC, IBD, IBS. Denies blood in stool. +HTN Patient's guardian states there has been incontinence and diarrhea.  Pain at clavicles, pain when trying to stand up. States Ibuprofen  helps joint pain.   Psoriasis is a chronic non-curable, but treatable genetic/hereditary disease that may have other systemic features affecting other organ systems such as joints (Psoriatic Arthritis). It is associated with an increased risk of inflammatory bowel disease, heart disease, non-alcoholic fatty liver disease, and depression.  Treatments include light and laser treatments; topical medications; and systemic medications including oral and injectables.  Treatment Plan:  Start Triamcinolone  0.1% cream twice a day to affected areas of psoriasis until improved. Avoid applying to face, groin, and axilla. Use as directed. Long-term use can cause thinning of the skin.  Plan to start a covered biologic pending lab results. Patient will call insurance to ask for their preferred drug Methotrexate is not indicated due to alcohol  use disorder and elevated liver enzymes  Reviewed risks of biologics including immunosuppression, infections, injection site reaction, and failure to improve condition. Goal is control of skin condition, not cure.  Some older biologics such as Humira and Enbrel may slightly increase risk of malignancy and may worsen congestive heart failure.  Taltz and Cosentyx may cause inflammatory bowel disease to flare. The use of biologics requires long term medication management, including periodic office visits and monitoring of blood work.  Topical steroids (such as triamcinolone , fluocinolone, fluocinonide, mometasone, clobetasol, halobetasol, betamethasone, hydrocortisone ) can cause thinning and lightening of the skin if they are used for too long in the same area. Your physician has selected the right strength medicine for your problem and  area affected on the body.  Please use your medication only as directed by your physician to prevent side effects.    MELANOCYTIC NEVI Exam: Tan-brown and/or pink-flesh-colored symmetric macules and papules at back. R lower back lesions present since birth and unchanged per patient  Treatment Plan: Benign appearing on exam today. Recommend observation. Call clinic for new or changing moles. Recommend daily use of broad spectrum spf 30+ sunscreen to sun-exposed areas.    Return in about 3 months (around 11/02/2023) for Psoriasis Follow Up.  I, Kate Fought, CMA, am acting as scribe for Boneta Sharps, MD.   Documentation: I have reviewed the above documentation for accuracy and completeness, and I agree with the above.  Boneta Sharps, MD

## 2023-08-02 NOTE — Patient Instructions (Addendum)
 The following are biologic medications for psoriasis. You can call your insurance company to see if any of these are covered.   Skyrizi Tremfya Stelara Cosentyx Bimzelx   Reviewed risks of biologics including immunosuppression, infections, injection site reaction, and failure to improve condition. Goal is control of skin condition, not cure.  Some older biologics such as Humira and Enbrel may slightly increase risk of malignancy and may worsen congestive heart failure.  Taltz and Cosentyx may cause inflammatory bowel disease to flare. The use of biologics requires long term medication management, including periodic office visits and monitoring of blood work.   Start Triamcinolone  0.1% cream twice a day to affected areas of psoriasis until improved. Avoid applying to face, groin, and axilla. Use as directed. Long-term use can cause thinning of the skin.   Topical steroids (such as triamcinolone , fluocinolone, fluocinonide, mometasone, clobetasol, halobetasol, betamethasone, hydrocortisone ) can cause thinning and lightening of the skin if they are used for too long in the same area. Your physician has selected the right strength medicine for your problem and area affected on the body. Please use your medication only as directed by your physician to prevent side effects.     Gentle Skin Care Guide  1. Bathe no more than once a day.  2. Avoid bathing in hot water  3. Use a mild soap like Dove, Vanicream, Cetaphil, CeraVe. Can use Lever 2000 or Cetaphil antibacterial soap  4. Use soap only where you need it. On most days, use it under your arms, between your legs, and on your feet. Let the water rinse other areas unless visibly dirty.  5. When you get out of the bath/shower, use a towel to gently blot your skin dry, don't rub it.  6. While your skin is still a little damp, apply a moisturizing cream such as Vanicream, CeraVe, Cetaphil, Eucerin, Sarna lotion or plain Vaseline Jelly. For  hands apply Neutrogena [place] Hand Cream or Excipial Hand Cream.  7. Reapply moisturizer any time you start to itch or feel dry.  8. Sometimes using free and clear laundry detergents can be helpful. Fabric softener sheets should be avoided. Downy Free & Gentle liquid, or any liquid fabric softener that is free of dyes and perfumes, it acceptable to use  9. If your doctor has given you prescription creams you may apply moisturizers over them      Due to recent changes in healthcare laws, you may see results of your pathology and/or laboratory studies on MyChart before the doctors have had a chance to review them. We understand that in some cases there may be results that are confusing or concerning to you. Please understand that not all results are received at the same time and often the doctors may need to interpret multiple results in order to provide you with the best plan of care or course of treatment. Therefore, we ask that you please give us  2 business days to thoroughly review all your results before contacting the office for clarification. Should we see a critical lab result, you will be contacted sooner.   If You Need Anything After Your Visit  If you have any questions or concerns for your doctor, please call our main line at [phone] and press option 4 to reach your doctor's medical assistant. If no one answers, please leave a voicemail as directed and we will return your call as soon as possible. Messages left after 4 pm will be answered the following business day.   You may  also send us  a message via MyChart. We typically respond to MyChart messages within 1-2 business days.  For prescription refills, please ask your pharmacy to contact our office. Our fax number is [phone].  If you have an urgent issue when the clinic is closed that cannot wait until the next business day, you can page your doctor at the number below.    Please note that while we do our best to be  available for urgent issues outside of office hours, we are not available 24/7.   If you have an urgent issue and are unable to reach us , you may choose to seek medical care at your doctor's office, retail clinic, urgent care center, or emergency room.  If you have a medical emergency, please immediately call 911 or go to the emergency department.  Pager Numbers  - Dr. Hester: [phone]  - Dr. Jackquline: [phone]  - Dr. Claudene: [phone]   In the event of inclement weather, please call our main line at [phone] for an update on the status of any delays or closures.  Dermatology Medication Tips: Please keep the boxes that topical medications come in in order to help keep track of the instructions about where and how to use these. Pharmacies typically print the medication instructions only on the boxes and not directly on the medication tubes.   If your medication is too expensive, please contact our office at [phone] option 4 or send us  a message through MyChart.   We are unable to tell what your co-pay for medications will be in advance as this is different depending on your insurance coverage. However, we may be able to find a substitute medication at lower cost or fill out paperwork to get insurance to cover a needed medication.   If a prior authorization is required to get your medication covered by your insurance company, please allow us  1-2 business days to complete this process.  Drug prices often vary depending on where the prescription is filled and some pharmacies may offer cheaper prices.  The website www.goodrx.com contains coupons for medications through different pharmacies. The prices here do not account for what the cost may be with help from insurance (it may be cheaper with your insurance), but the website can give you the price if you did not use any insurance.  - You can print the associated coupon and take it with your prescription to the pharmacy.   - You may also stop by our office during regular business hours and pick up a GoodRx coupon card.  - If you need your prescription sent electronically to a different pharmacy, notify our office through [facility] or by phone at [phone] option 4.     Si Usted Necesita Algo Despus de Su Visita  Tambin puede enviarnos un mensaje a travs de Clinical cytogeneticist. Por lo general respondemos a los mensajes de MyChart en el transcurso de 1 a 2 das hbiles.  Para renovar recetas, por favor pida a su farmacia que se ponga en contacto con nuestra oficina. Randi lakes de fax es [place] [phone].  Si tiene un asunto urgente cuando la clnica est cerrada y que no puede esperar hasta el siguiente da hbil, puede llamar/localizar a su doctor(a) al nmero que aparece a continuacin.   Por favor, tenga en cuenta que aunque hacemos todo lo posible para estar disponibles para asuntos urgentes fuera del horario de [place], no estamos disponibles las 24 horas del da, los 7 [address]  [address] de la [place].  Si tiene un problema urgente y no puede comunicarse con nosotros, puede optar por buscar atencin mdica  en el consultorio de su doctor(a), en una clnica privada, en un centro de atencin urgente o en una sala de emergencias.  Si tiene Engineer, drilling, por favor llame inmediatamente al 911 o vaya a la sala de emergencias.  Nmeros de bper  - Dr. Hester: [phone]  - Dra. Jackquline: [phone]  - Dr. Claudene: [phone]   En caso de inclemencias del tiempo, por favor llame a landry capes principal al [phone] para una actualizacin sobre el [place] de cualquier retraso o cierre.  Consejos para la medicacin en dermatologa: Por favor, guarde las cajas en las que vienen los medicamentos de uso tpico para ayudarle a seguir las instrucciones sobre dnde y cmo usarlos. Las farmacias generalmente imprimen las instrucciones del medicamento slo en las cajas y no directamente en los tubos del  [place].   Si su medicamento es muy caro, por favor, pngase en contacto con landry rieger llamando al [phone] y presione la opcin 4 o envenos un mensaje a travs de Clinical cytogeneticist.   No podemos decirle cul ser su copago por los medicamentos por adelantado ya que esto es diferente dependiendo de la cobertura de su seguro. Sin embargo, es posible que podamos encontrar un medicamento sustituto a Audiological scientist un formulario para que el seguro cubra el medicamento que se considera necesario.   Si se requiere una autorizacin previa para que su compaa de seguros malta su medicamento, por favor permtanos de 1 a 2 das hbiles para completar este proceso.  Los precios de los medicamentos varan con frecuencia dependiendo del Environmental consultant de dnde se surte la receta y alguna farmacias pueden ofrecer precios ms baratos.  El sitio web www.goodrx.com tiene cupones para medicamentos de Health and safety inspector. Los precios aqu no tienen en cuenta lo que podra costar con la ayuda del seguro (puede ser ms barato con su seguro), pero el sitio web puede darle el precio si no utiliz Tourist information centre manager.  - Puede imprimir el cupn correspondiente y llevarlo con su receta a la farmacia.  - Tambin puede pasar por nuestra oficina durante el horario de atencin regular y Education officer, museum una tarjeta de cupones de GoodRx.  - Si necesita que su receta se enve electrnicamente a una farmacia diferente, informe a nuestra oficina a travs de MyChart de [place] o por telfono llamando al [phone] y presione la opcin 4.

## 2023-08-04 ENCOUNTER — Encounter: Payer: Self-pay | Admitting: Dermatology

## 2023-08-10 LAB — HEPATITIS B CORE ANTIBODY, TOTAL: Hep B Core Total Ab: NEGATIVE

## 2023-08-10 LAB — QUANTIFERON-TB GOLD PLUS
QuantiFERON Mitogen Value: 2.68 [IU]/mL
QuantiFERON Nil Value: 0.02 [IU]/mL
QuantiFERON TB1 Ag Value: 0.02 [IU]/mL
QuantiFERON TB2 Ag Value: 0.02 [IU]/mL
QuantiFERON-TB Gold Plus: NEGATIVE

## 2023-08-10 LAB — HEPATITIS B SURFACE ANTIBODY,QUALITATIVE: Hep B Surface Ab, Qual: NONREACTIVE

## 2023-08-10 LAB — HEPATITIS B SURFACE ANTIGEN: Hepatitis B Surface Ag: NEGATIVE

## 2023-08-12 ENCOUNTER — Ambulatory Visit: Payer: Self-pay | Admitting: Dermatology

## 2023-08-16 ENCOUNTER — Other Ambulatory Visit: Payer: Self-pay | Admitting: Dermatology

## 2023-08-16 DIAGNOSIS — L409 Psoriasis, unspecified: Secondary | ICD-10-CM

## 2023-08-16 DIAGNOSIS — Z79899 Other long term (current) drug therapy: Secondary | ICD-10-CM

## 2023-08-16 MED ORDER — SKYRIZI PEN 150 MG/ML ~~LOC~~ SOAJ: 150.0000 mg | SUBCUTANEOUS | 2 refills | Status: DC

## 2023-08-16 MED ORDER — SKYRIZI PEN 150 MG/ML ~~LOC~~ SOAJ: 150.0000 mg | SUBCUTANEOUS | 1 refills | Status: DC

## 2023-08-16 NOTE — Telephone Encounter (Signed)
 Patient has also called to let you know these are approved through his insurance. aw

## 2023-08-22 ENCOUNTER — Ambulatory Visit (INDEPENDENT_AMBULATORY_CARE_PROVIDER_SITE_OTHER): Payer: MEDICAID

## 2023-08-22 DIAGNOSIS — L400 Psoriasis vulgaris: Secondary | ICD-10-CM

## 2023-08-22 MED ORDER — RISANKIZUMAB-RZAA 150 MG/ML ~~LOC~~ SOAJ
150.0000 mg | Freq: Once | SUBCUTANEOUS | Status: AC
  Administered 2023-08-22 (×2): 150 mg via SUBCUTANEOUS

## 2023-08-22 NOTE — Progress Notes (Signed)
 Patient here today for Skyrizi  injection for Psoriasis. Loading dose week 0.  Skyrizi  150mg  injected into the Right upper arm. Patient tolerated procedure well.   Onu:[id number] ExP:09/08/2024  Stefano Saba CMA

## 2023-09-17 ENCOUNTER — Telehealth: Payer: Self-pay

## 2023-09-17 NOTE — Telephone Encounter (Signed)
 Patient called to let us  know he got a note from [place] saying Skyrizi  is not approved,  Dicussed with patient keep his appt here 09/19/23 with the nurse for Skyrizi  sample and bring the note from [place] for Alan to review

## 2023-09-19 ENCOUNTER — Ambulatory Visit (INDEPENDENT_AMBULATORY_CARE_PROVIDER_SITE_OTHER): Payer: MEDICAID

## 2023-09-19 DIAGNOSIS — L400 Psoriasis vulgaris: Secondary | ICD-10-CM

## 2023-09-19 MED ORDER — RISANKIZUMAB-RZAA 150 MG/ML ~~LOC~~ SOAJ
150.0000 mg | Freq: Once | SUBCUTANEOUS | Status: AC
  Administered 2023-09-19: 150 mg via SUBCUTANEOUS

## 2023-09-19 NOTE — Progress Notes (Signed)
 Patient here today for second loading dose of Skyrizi , week 4 for Psoriasis Vulgaris.   Skyrizi  150mg  Pen injected into left arm. Patient tolerated well.  LOT: [id number] EXP: 08/2024  Alan Pizza, RMA

## 2023-09-24 ENCOUNTER — Encounter: Payer: Self-pay | Admitting: Cardiology

## 2023-09-24 ENCOUNTER — Ambulatory Visit (INDEPENDENT_AMBULATORY_CARE_PROVIDER_SITE_OTHER): Payer: MEDICAID | Admitting: Cardiology

## 2023-09-24 VITALS — BP 108/68 | HR 78 | Ht 72.0 in | Wt 221.0 lb

## 2023-09-24 DIAGNOSIS — Z7689 Persons encountering health services in other specified circumstances: Secondary | ICD-10-CM | POA: Insufficient documentation

## 2023-09-24 DIAGNOSIS — Z0130 Encounter for examination of blood pressure without abnormal findings: Secondary | ICD-10-CM

## 2023-09-24 DIAGNOSIS — K760 Fatty (change of) liver, not elsewhere classified: Secondary | ICD-10-CM

## 2023-09-24 DIAGNOSIS — K7010 Alcoholic hepatitis without ascites: Secondary | ICD-10-CM

## 2023-09-24 DIAGNOSIS — F419 Anxiety disorder, unspecified: Secondary | ICD-10-CM | POA: Diagnosis not present

## 2023-09-24 DIAGNOSIS — R29898 Other symptoms and signs involving the musculoskeletal system: Secondary | ICD-10-CM | POA: Diagnosis not present

## 2023-09-24 DIAGNOSIS — I5032 Chronic diastolic (congestive) heart failure: Secondary | ICD-10-CM

## 2023-09-24 DIAGNOSIS — F32A Depression, unspecified: Secondary | ICD-10-CM

## 2023-09-24 MED ORDER — VITAMIN B-1 100 MG PO TABS: 100.0000 mg | ORAL_TABLET | Freq: Every day | ORAL | 1 refills | Status: AC

## 2023-09-24 MED ORDER — ESCITALOPRAM OXALATE 5 MG PO TABS: 5.0000 mg | ORAL_TABLET | Freq: Every day | ORAL | 3 refills | Status: DC

## 2023-09-24 MED ORDER — HYDROXYZINE HCL 10 MG PO TABS: 10.0000 mg | ORAL_TABLET | Freq: Three times a day (TID) | ORAL | 0 refills | Status: DC | PRN

## 2023-09-24 MED ORDER — FOLIC ACID 1 MG PO TABS: 1.0000 mg | ORAL_TABLET | Freq: Every day | ORAL | 1 refills | Status: AC

## 2023-09-24 NOTE — Progress Notes (Unsigned)
 New Patient Office Visit  Subjective   Patient ID: Lawrence Rowe, male    DOB: June 17, 1970  Age: 53 y.o. MRN: [id number]  CC:  Chief Complaint  Patient presents with   New Patient (Initial Visit)    New Patient     HPI Lawrence Rowe presents to establish care Previous Primary Care provider/office:   he does have additional concerns to discuss today.   Patient in office to establish care. Patient caregiver with patient today. Patient has multiple needs today.  Patient has had multiple hospitalizations over the past several years for alcohol  withdrawal, the most recent 07/2023. Patient reports he continues to have alcohol  cravings. Discussed GAD7 and PHQ9 scores. Referral sent to psychiatry. Lexapro  5 mg sent in. Hydroxyzine  10 mg as needed sent in.  Patient fasting, will get lab work.  Patient diagnosed with diastolic heart failure during hospitalization 01/2022, has not seen cardiology as an outpatient, will send a referral.  Patient deconditioned from long term excessive alcohol  use, will send referral to PT.  Patient has history of fatty liver, ascites, will send referral to GI.     Outpatient Encounter Medications as of 09/24/2023  Medication Sig   escitalopram  (LEXAPRO ) 5 MG tablet Take 1 tablet (5 mg total) by mouth daily.   hydrOXYzine  (ATARAX ) 10 MG tablet Take 1 tablet (10 mg total) by mouth 3 (three) times daily as needed.   metoprolol  succinate (TOPROL -XL) 50 MG 24 hr tablet Take 1 tablet (50 mg total) by mouth daily. Take with or immediately following a meal.   Multiple Vitamin (MULTIVITAMIN WITH MINERALS) TABS tablet Take 1 tablet by mouth daily.   risankizumab -rzaa (SKYRIZI  PEN) 150 MG/ML pen Inject 1 mL (150 mg total) into the skin as directed. At weeks 0 & 4.   risankizumab -rzaa (SKYRIZI  PEN) 150 MG/ML pen Inject 1 mL (150 mg total) into the skin as directed. Every 12 weeks for maintenance.   triamcinolone  cream (KENALOG ) 0.1 % Apply twice daily to  affected areas until improved, for psoriasis. Avoid applying to face, groin, and axilla.   [DISCONTINUED] thiamine  (VITAMIN B-1) 100 MG tablet Take 1 tablet (100 mg total) by mouth daily.   folic acid  (FOLVITE ) 1 MG tablet Take 1 tablet (1 mg total) by mouth daily.   thiamine  (VITAMIN B-1) 100 MG tablet Take 1 tablet (100 mg total) by mouth daily.   [DISCONTINUED] folic acid  (FOLVITE ) 1 MG tablet Take 1 tablet (1 mg total) by mouth daily.   [DISCONTINUED] pantoprazole  (PROTONIX ) 40 MG tablet Take 1 tablet (40 mg total) by mouth daily.   [DISCONTINUED] PHENobarbital  (LUMINAL) 32.4 MG tablet Take 1 tablet (32.4 mg total) by mouth every 8 (eight) hours for 4 doses.   No facility-administered encounter medications on file as of 09/24/2023.    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)    GERD (gastroesophageal reflux disease)    Hypertension     Past Surgical History:  Procedure Laterality Date   ELBOW SURGERY Left     Family History  Problem Relation Age of Onset   Alcohol  abuse Neg Hx     Social History   Socioeconomic History   Marital status: Single    Spouse name: Not on file   Number of children: Not on file   Years of education: Not on file   Highest education level: Not on file  Occupational History  Not on file  Tobacco Use   Smoking status: Former    Current packs/day: 0.00    Types: Cigarettes    Quit date: 2020    Years since quitting: 5.7   Smokeless tobacco: Never  Vaping Use   Vaping status: Never Used  Substance and Sexual Activity   Alcohol  use: Yes    Alcohol /week: 12.0 standard drinks of alcohol     Types: 12 Cans of beer per week    Comment: daily 12 pk   Drug use: Never   Sexual activity: Not Currently  Other Topics Concern   Not on file  Social History Narrative   Not on file   Social Drivers of Health   Financial Resource Strain: Not on file  Food  Insecurity: No Food Insecurity (07/21/2023)   Hunger Vital Sign    Worried About Running Out of Food in the Last Year: Never true    Ran Out of Food in the Last Year: Never true  Transportation Needs: No Transportation Needs (07/21/2023)   PRAPARE - Administrator, Civil Service (Medical): No    Lack of Transportation (Non-Medical): No  Physical Activity: Not on file  Stress: Not on file  Social Connections: Not on file  Intimate Partner Violence: Not At Risk (07/21/2023)   Humiliation, Afraid, Rape, and Kick questionnaire    Fear of Current or Ex-Partner: No    Emotionally Abused: No    Physically Abused: No    Sexually Abused: No    Review of Systems  Constitutional: Negative.   HENT: Negative.    Eyes: Negative.   Respiratory: Negative.  Negative for shortness of breath.   Cardiovascular: Negative.  Negative for chest pain.  Gastrointestinal: Negative.  Negative for abdominal pain, constipation and diarrhea.  Genitourinary: Negative.   Musculoskeletal:  Negative for joint pain and myalgias.  Skin: Negative.   Neurological: Negative.  Negative for dizziness and headaches.  Endo/Heme/Allergies: Negative.   Psychiatric/Behavioral:  Positive for depression. The patient is nervous/anxious and has insomnia.   All other systems reviewed and are negative.       Objective   BP 108/68 (BP Location: Right Arm, Patient Position: Sitting, Cuff Size: Small)   Pulse 78   Ht 6' (1.829 m)   Wt 221 lb (100.2 kg)   SpO2 95%   BMI 29.97 kg/m   Physical Exam Nursing note reviewed.  Constitutional:      Appearance: Normal appearance. He is normal weight.  HENT:     Head: Normocephalic and atraumatic.     Nose: Nose normal.     Mouth/Throat:     Mouth: Mucous membranes are moist.     Pharynx: Oropharynx is clear.  Eyes:     Extraocular Movements: Extraocular movements intact.     Conjunctiva/sclera: Conjunctivae normal.     Pupils: Pupils are equal, round, and  reactive to light.  Cardiovascular:     Rate and Rhythm: Normal rate and regular rhythm.     Pulses: Normal pulses.     Heart sounds: Normal heart sounds.  Pulmonary:     Effort: Pulmonary effort is normal.     Breath sounds: Normal breath sounds.  Abdominal:     General: Abdomen is flat. Bowel sounds are normal.     Palpations: Abdomen is soft.  Musculoskeletal:        General: Normal range of motion.     Cervical back: Normal range of motion.  Skin:    General: Skin is  warm and dry.  Neurological:     General: No focal deficit present.     Mental Status: He is alert and oriented to person, place, and time.  Psychiatric:        Mood and Affect: Mood normal.        Behavior: Behavior normal.        Thought Content: Thought content normal.        Judgment: Judgment normal.       09/24/2023    2:18 PM  GAD 7 : Generalized Anxiety Score  Nervous, Anxious, on Edge 3  Control/stop worrying 3  Worry too much - different things 3  Trouble relaxing 3  Restless 0  Easily annoyed or irritable 2  Afraid - awful might happen 2  Total GAD 7 Score 16  Anxiety Difficulty Extremely difficult    Flowsheet Row Office Visit from 09/24/2023 in [facility]  Thoughts that you would be better off dead, or of hurting yourself in some way More than half the days  PHQ-9 Total Score 21       Assessment & Plan:  Lexapro  5 mg Hydroxyzine  prn Referral sent to psychiatry Fasting lab work today Referral sent to cardiology Referral sent to PT Referral sent to GI  Problem List Items Addressed This Visit       Cardiovascular and Mediastinum   Chronic diastolic CHF (congestive heart failure) (HCC)   Relevant Orders   Ambulatory referral to Cardiology     Digestive   Alcoholic hepatitis   Relevant Orders   Ambulatory referral to Gastroenterology   Fatty liver   Relevant Orders   Ambulatory referral to Gastroenterology     Other   Encounter to establish care -  Primary   Other Visit Diagnoses       Anxiety and depression       Relevant Medications   escitalopram  (LEXAPRO ) 5 MG tablet   hydrOXYzine  (ATARAX ) 10 MG tablet   Other Relevant Orders   Ambulatory referral to Psychiatry     Weakness of both lower extremities       Relevant Orders   Ambulatory referral to Physical Therapy       Return in about 4 weeks (around 10/22/2023) for fasting lab work prior.   Total time spent: 25 minutes  [employer], NP  09/24/2023   This document may have been prepared by Dragon Voice Recognition software and as such may include unintentional dictation errors.

## 2023-09-26 ENCOUNTER — Other Ambulatory Visit: Payer: MEDICAID

## 2023-09-26 DIAGNOSIS — I10 Essential (primary) hypertension: Secondary | ICD-10-CM

## 2023-09-26 DIAGNOSIS — Z13220 Encounter for screening for lipoid disorders: Secondary | ICD-10-CM

## 2023-09-26 DIAGNOSIS — Z131 Encounter for screening for diabetes mellitus: Secondary | ICD-10-CM

## 2023-09-26 DIAGNOSIS — Z1329 Encounter for screening for other suspected endocrine disorder: Secondary | ICD-10-CM

## 2023-09-27 ENCOUNTER — Ambulatory Visit: Payer: Self-pay | Admitting: Cardiology

## 2023-09-27 LAB — CBC WITH DIFF/PLATELET
Basophils Absolute: 0 x10E3/uL (ref 0.0–0.2)
Basos: 1 %
EOS (ABSOLUTE): 0.1 x10E3/uL (ref 0.0–0.4)
Eos: 2 %
Hematocrit: 44.4 % (ref 37.5–51.0)
Hemoglobin: 14.6 g/dL (ref 13.0–17.7)
Immature Grans (Abs): 0 x10E3/uL (ref 0.0–0.1)
Immature Granulocytes: 0 %
Lymphocytes Absolute: 1.6 x10E3/uL (ref 0.7–3.1)
Lymphs: 25 %
MCH: 32.7 pg (ref 26.6–33.0)
MCHC: 32.9 g/dL (ref 31.5–35.7)
MCV: 99 fL — ABNORMAL HIGH (ref 79–97)
Monocytes Absolute: 1 x10E3/uL — ABNORMAL HIGH (ref 0.1–0.9)
Monocytes: 15 %
Neutrophils Absolute: 3.5 x10E3/uL (ref 1.4–7.0)
Neutrophils: 57 %
Platelets: 133 x10E3/uL — ABNORMAL LOW (ref 150–450)
RBC: 4.47 x10E6/uL (ref 4.14–5.80)
RDW: 15.9 % — ABNORMAL HIGH (ref 11.6–15.4)
WBC: 6.2 x10E3/uL (ref 3.4–10.8)

## 2023-09-27 LAB — HEMOGLOBIN A1C
Est. average glucose Bld gHb Est-mCnc: 108 mg/dL
Hgb A1c MFr Bld: 5.4 % (ref 4.8–5.6)

## 2023-09-27 LAB — CMP14+EGFR
ALT: 24 IU/L (ref 0–44)
AST: 33 IU/L (ref 0–40)
Albumin: 4.1 g/dL (ref 3.8–4.9)
Alkaline Phosphatase: 74 IU/L (ref 47–123)
BUN/Creatinine Ratio: 18 (ref 9–20)
BUN: 14 mg/dL (ref 6–24)
Bilirubin Total: 1.2 mg/dL (ref 0.0–1.2)
CO2: 23 mmol/L (ref 20–29)
Calcium: 9.7 mg/dL (ref 8.7–10.2)
Chloride: 100 mmol/L (ref 96–106)
Creatinine, Ser: 0.77 mg/dL (ref 0.76–1.27)
Globulin, Total: 3.6 g/dL (ref 1.5–4.5)
Glucose: 102 mg/dL — ABNORMAL HIGH (ref 70–99)
Potassium: 4.3 mmol/L (ref 3.5–5.2)
Sodium: 137 mmol/L (ref 134–144)
Total Protein: 7.7 g/dL (ref 6.0–8.5)
eGFR: 106 mL/min/1.73 (ref >59)

## 2023-09-27 LAB — LIPID PANEL
Chol/HDL Ratio: 2.6 ratio (ref 0.0–5.0)
Cholesterol, Total: 138 mg/dL (ref 100–199)
HDL: 54 mg/dL (ref >39)
LDL Chol Calc (NIH): 70 mg/dL (ref 0–99)
Triglycerides: 66 mg/dL (ref 0–149)
VLDL Cholesterol Cal: 14 mg/dL (ref 5–40)

## 2023-09-27 LAB — TSH: TSH: 1.72 u[IU]/mL (ref 0.450–4.500)

## 2023-10-01 ENCOUNTER — Telehealth: Payer: Self-pay

## 2023-10-01 NOTE — Telephone Encounter (Signed)
 Insurance is denying Skyrizi  PA.  The reason being is, patient must try and fail light therapy and one of the preferred drugs (Acitretin, Methotrexate or Cyclosporine). Patient must also try and fail two preferred drugs Cosentyx, Enbrel and Humira.   I can continue fighting with the appeal, do you have a good explanation to give to insurance?

## 2023-10-02 ENCOUNTER — Ambulatory Visit: Payer: MEDICAID

## 2023-10-02 NOTE — Telephone Encounter (Signed)
 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 aw

## 2023-10-04 ENCOUNTER — Encounter: Payer: Self-pay | Admitting: Dermatology

## 2023-10-07 ENCOUNTER — Encounter: Payer: Self-pay | Admitting: Cardiology

## 2023-10-08 ENCOUNTER — Ambulatory Visit: Payer: MEDICAID

## 2023-10-08 ENCOUNTER — Other Ambulatory Visit: Payer: Self-pay

## 2023-10-08 ENCOUNTER — Other Ambulatory Visit: Payer: Self-pay | Admitting: Dermatology

## 2023-10-08 DIAGNOSIS — L400 Psoriasis vulgaris: Secondary | ICD-10-CM

## 2023-10-08 MED ORDER — COSENTYX UNOREADY 300 MG/2ML ~~LOC~~ SOAJ
300.0000 mg | SUBCUTANEOUS | 0 refills | Status: DC
Start: 2023-10-08 — End: 2023-10-08

## 2023-10-08 MED ORDER — COSENTYX UNOREADY 300 MG/2ML ~~LOC~~ SOAJ: 300.0000 mg | SUBCUTANEOUS | 0 refills | Status: AC

## 2023-10-08 MED ORDER — COSENTYX UNOREADY 300 MG/2ML ~~LOC~~ SOAJ: 300.0000 mg | SUBCUTANEOUS | 10 refills | Status: DC

## 2023-10-08 MED ORDER — COSENTYX SENSOREADY (300 MG) 150 MG/ML ~~LOC~~ SOAJ
300.0000 mg | SUBCUTANEOUS | 0 refills | Status: DC
Start: 2023-10-08 — End: 2023-10-08

## 2023-10-08 MED ORDER — COSENTYX SENSOREADY (300 MG) 150 MG/ML ~~LOC~~ SOAJ: 300.0000 mg | SUBCUTANEOUS | 5 refills | Status: DC

## 2023-10-08 MED ORDER — COSENTYX UNOREADY 300 MG/2ML ~~LOC~~ SOAJ: 300.0000 mg | SUBCUTANEOUS | 10 refills | Status: AC

## 2023-10-08 NOTE — Therapy (Deleted)
 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)    GERD (gastroesophageal reflux disease)    Hypertension    Past Surgical History:  Procedure Laterality Date   ELBOW SURGERY Left    Patient Active Problem List   Diagnosis Date Noted   Encounter to establish care 09/24/2023   Alcohol  withdrawal delirium, acute, hypoactive (HCC) 07/20/2023   Elevated LFTs 12/22/2022   Serum total bilirubin elevated 12/22/2022   GERD (gastroesophageal reflux disease) 12/22/2022   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   Lactic acidosis 06/14/2022   Alcohol  withdrawal with inpatient treatment (HCC) 06/08/2022   High anion gap metabolic acidosis 06/08/2022   Chronic diastolic CHF (congestive heart failure) (HCC) 01/13/2022   Overweight (BMI 25.0-29.9) 01/12/2022   Fatty liver 01/12/2022   Abnormal liver function 01/11/2022   Wernicke encephalopathy syndrome 12/27/2021   Delirium tremens (HCC) 12/27/2021   Alcoholic hepatitis 12/27/2021   Alcohol  withdrawal (HCC) 12/23/2021   Transaminitis 12/23/2021   Unilateral edema of lower extremity 12/23/2021   Dermatitis 05/10/2021   Primary hypertension 05/10/2021   Tachycardia with hypertension 04/19/2021   PAD (peripheral artery disease) 04/19/2021   Alcohol  abuse 04/19/2021   Metabolic syndrome 04/19/2021    ONSET DATE: ***  REFERRING DIAG: R29.898 (ICD-10-CM) - Weakness of both lower extremities   THERAPY DIAG:  No diagnosis found.  Rationale for Evaluation and Treatment: {HABREHAB:27488}  SUBJECTIVE:                                                                                                                                                                                              SUBJECTIVE STATEMENT: *** Pt accompanied by: {accompnied:27141}  PERTINENT HISTORY: ***  PAIN:  Are you having pain? {OPRCPAIN:27236}  PRECAUTIONS: {Therapy precautions:24002}  RED FLAGS: {PT Red Flags:29287}   WEIGHT BEARING RESTRICTIONS: {Yes ***/No:24003}  FALLS: Has patient fallen in last 6 months? {fallsyesno:27318}  LIVING ENVIRONMENT: Lives with: {OPRC lives with:25569::lives with their family} Lives in: {Lives in:25570} Stairs: {opstairs:27293} Has following equipment at home: {Assistive devices:23999}  PLOF: {PLOF:24004}  PATIENT GOALS: ***  OBJECTIVE:  Note: Objective measures were completed at Evaluation unless otherwise noted.  DIAGNOSTIC FINDINGS: ***  COGNITION: Overall cognitive status: {cognition:24006}   SENSATION: {sensation:27233}  COORDINATION: ***  EDEMA:  {edema:24020}  MUSCLE TONE: {LE tone:25568}  MUSCLE LENGTH: Hamstrings: Right *** deg; Left *** deg  Thomas test: Right *** deg; Left *** deg  DTRs:  {DTR SITE:24025}  POSTURE: {posture:25561}  LOWER EXTREMITY ROM:     {AROM/PROM:27142}  Right Eval Left Eval  Hip flexion    Hip extension    Hip abduction    Hip adduction    Hip internal rotation    Hip external rotation    Knee flexion    Knee extension    Ankle dorsiflexion    Ankle plantarflexion    Ankle inversion    Ankle eversion     (Blank rows = not tested)  LOWER EXTREMITY MMT:    MMT Right Eval Left Eval  Hip flexion    Hip extension    Hip abduction    Hip adduction    Hip internal rotation    Hip external rotation    Knee flexion    Knee extension    Ankle dorsiflexion    Ankle plantarflexion    Ankle inversion    Ankle eversion    (Blank rows = not tested)  BED MOBILITY:  {bed mobility:32615:p}  TRANSFERS: {transfers eval:32620}  RAMP:  {ramp eval:32616}  CURB:  {curb eval:32617}  STAIRS: {stairs eval:32618} GAIT: Findings:  {GaitneuroPT:32644::Distance walked: ***,Comments: ***}  FUNCTIONAL TESTS:  {Functional tests:24029}  PATIENT SURVEYS:  {rehab surveys:24030}                                                                                                                              TREATMENT DATE: ***    PATIENT EDUCATION: Education details: *** Person educated: {Person educated:25204} Education method: {Education Method:25205} Education comprehension: {Education Comprehension:25206}  HOME EXERCISE PROGRAM: ***  GOALS: Goals reviewed with patient? {yes/no:20286}  SHORT TERM GOALS: Target date: ***  *** Baseline: Goal status: INITIAL  2.  *** Baseline:  Goal status: INITIAL  3.  *** Baseline:  Goal status: INITIAL  4.  *** Baseline:  Goal status: INITIAL  5.  *** Baseline:  Goal status: INITIAL  6.  *** Baseline:  Goal status: INITIAL  LONG TERM GOALS: Target date: ***  *** Baseline:  Goal status: INITIAL  2.  *** Baseline:  Goal status: INITIAL  3.  *** Baseline:  Goal status: INITIAL  4.  *** Baseline:  Goal status: INITIAL  5.  *** Baseline:  Goal status: INITIAL  6.  *** Baseline:  Goal status: INITIAL  ASSESSMENT:  CLINICAL IMPRESSION: Patient is a *** y.o. *** who was seen today for physical therapy evaluation and treatment for ***.   OBJECTIVE IMPAIRMENTS: {opptimpairments:25111}.   ACTIVITY LIMITATIONS: {activitylimitations:27494}  PARTICIPATION LIMITATIONS: {participationrestrictions:25113}  PERSONAL FACTORS: {Personal factors:25162} are also affecting patient's functional outcome.   REHAB POTENTIAL: {rehabpotential:25112}  CLINICAL DECISION MAKING: {clinical decision making:25114}  EVALUATION COMPLEXITY: {Evaluation complexity:25115}  PLAN:  PT FREQUENCY: {rehab frequency:25116}  PT DURATION: {rehab duration:25117}  PLANNED INTERVENTIONS: {rehab planned interventions:25118::97110-Therapeutic exercises,97530-  Therapeutic [phone]- Neuromuscular re-education,97535- Self Rjmz,02859- Manual therapy,Patient/Family education}  PLAN FOR NEXT SESSION: ***  Fonda Simpers, PT, DPT Physical Therapist - [facility]  10/08/23, 2:52 PM

## 2023-10-09 ENCOUNTER — Other Ambulatory Visit: Payer: Self-pay

## 2023-10-09 MED ORDER — HYDROXYZINE HCL 10 MG PO TABS: 10.0000 mg | ORAL_TABLET | Freq: Three times a day (TID) | ORAL | 1 refills | Status: DC | PRN

## 2023-10-10 ENCOUNTER — Ambulatory Visit: Payer: MEDICAID

## 2023-10-15 ENCOUNTER — Ambulatory Visit: Payer: MEDICAID | Admitting: Physical Therapy

## 2023-10-17 ENCOUNTER — Ambulatory Visit: Payer: MEDICAID

## 2023-10-22 ENCOUNTER — Ambulatory Visit: Payer: MEDICAID | Admitting: Cardiology

## 2023-10-22 ENCOUNTER — Ambulatory Visit: Payer: MEDICAID

## 2023-10-22 ENCOUNTER — Encounter: Payer: Self-pay | Admitting: Cardiology

## 2023-10-22 VITALS — BP 114/80 | HR 63 | Ht 72.0 in | Wt 220.0 lb

## 2023-10-22 DIAGNOSIS — I10 Essential (primary) hypertension: Secondary | ICD-10-CM

## 2023-10-22 DIAGNOSIS — K760 Fatty (change of) liver, not elsewhere classified: Secondary | ICD-10-CM | POA: Diagnosis not present

## 2023-10-22 DIAGNOSIS — R945 Abnormal results of liver function studies: Secondary | ICD-10-CM

## 2023-10-22 DIAGNOSIS — F419 Anxiety disorder, unspecified: Secondary | ICD-10-CM

## 2023-10-22 DIAGNOSIS — K7010 Alcoholic hepatitis without ascites: Secondary | ICD-10-CM

## 2023-10-22 DIAGNOSIS — F32A Depression, unspecified: Secondary | ICD-10-CM

## 2023-10-22 MED ORDER — ESCITALOPRAM OXALATE 10 MG PO TABS: 10.0000 mg | ORAL_TABLET | Freq: Every day | ORAL | 3 refills | Status: DC

## 2023-10-22 NOTE — Progress Notes (Unsigned)
 Cardiology Office Note  Date:  10/23/2023   ID:  LADARRELL CORNWALL, DOB 1970/03/26, MRN [id number]  PCP:  Pcp, No   Chief Complaint  Patient presents with   New Patient (Initial Visit)    Ref by Jeoffrey Pollen, NP for CHF. Patient c/o swelling in feet.     HPI:  Joshiah Traynham Elliottis a 53 y.o. malewith past medical history of: Alcohol  abuse 12 cans of beer a day Anxiety Hypertension Smoker Who presents by referral from [employer] for consultation of his leg swelling, chronic diastolic CHF  Seen yesterday by primary care Notes indicating 12 cans of beer per day in the past Reports he has quit since July 2025 Prior smoker quit 2020 No acute issues documented on primary care follow-up  Notes from primary care indicating he was diagnosed with diastolic CHF on hospitalization January 2024 This diagnosis was placed on discharge summary from hospital January 2024 for elevated BNP, ankle swelling  History of fatty liver, ascites Multiple hospitalizations for alcohol -related disorder  Hospitalized July 2025 Alcohol  withdrawal Alcohol  cirrhosis  Hospitalized March 2025 Presenting with alcohol  withdrawal, early DT Treated with CIWA protocol Ativan  and phenobarbital   Hospitalized December 2024 Alcohol  withdrawal symptoms Treated with phenobarbital , Ativan , CIWA protocol  Hospitalized June 2024 alcohol  withdrawal  Hospitalized January 2024 alcohol  withdrawal 9-12 beers a day Treated with Ativan  Reported elevated BNP, echo with grade 1 diastolic dysfunction normal ejection fraction Hypertensive on admission January 11, 2022 with tachycardia and withdrawal from alcohol   Hospitalized December 2023 alcohol  withdrawal CIWA protocol  Seen in the ER March 2023 alcohol  intoxication foot swelling At that time reported 12 beers a day  Echocardiogram January 2024 EF 55 to 60%  Lab work reviewed A1c 5.4 Total cholesterol 138 LDL 70  EKG personally reviewed by myself  on todays visit EKG Interpretation Date/Time:  Tuesday October 23 2023 14:01:00 EDT Ventricular Rate:  60 PR Interval:  166 QRS Duration:  94 QT Interval:  428 QTC Calculation: 428 R Axis:   19  Text Interpretation: Normal sinus rhythm Normal ECG When compared with ECG of 20-Jul-2023 15:34, No significant change was found Confirmed by Perla Lye [phone]) on 10/23/2023 2:37:14 PM    PMH:   has a past medical history of Alcohol  abuse, 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  PSH:    Past Surgical History:  Procedure Laterality Date   ELBOW SURGERY Left     Current Outpatient Medications  Medication Sig Dispense Refill   escitalopram  (LEXAPRO ) 10 MG tablet Take 1 tablet (10 mg total) by mouth daily. 30 tablet 3   folic acid  (FOLVITE ) 1 MG tablet Take 1 tablet (1 mg total) by mouth daily. 90 tablet 1   hydrOXYzine  (ATARAX ) 10 MG tablet Take 1 tablet (10 mg total) by mouth 3 (three) times daily as needed. 90 tablet 1   metoprolol  succinate (TOPROL -XL) 50 MG 24 hr tablet Take 1 tablet (50 mg total) by mouth daily. Take with or immediately following a meal. 90 tablet 3   Multiple Vitamin (MULTIVITAMIN WITH MINERALS) TABS tablet Take 1 tablet by mouth daily. 30 tablet 0   [START ON 12/12/2023] Secukinumab (COSENTYX UNOREADY) 300 MG/2ML SOAJ Inject 300 mg into the skin once a week. 10 mL 0   thiamine  (VITAMIN B-1) 100 MG tablet Take 1 tablet (100 mg total) by mouth daily. 90 tablet 1   triamcinolone  cream (KENALOG ) 0.1 % Apply twice daily to affected areas  until improved, for psoriasis. Avoid applying to face, groin, and axilla. 454 g 2   [START ON 01/16/2024] Secukinumab (COSENTYX UNOREADY) 300 MG/2ML SOAJ Inject 300 mg into the skin every 28 (twenty-eight) days. (Patient not taking: Reported on 10/23/2023) 2 mL 10   No current facility-administered medications for this visit.     Allergies:    Penicillins   Social History:  The patient  reports that he quit smoking about 5 years ago. His smoking use included cigarettes. He has never used smokeless tobacco. He reports that he does not currently use alcohol  after a past usage of about 12.0 standard drinks of alcohol  per week. He reports that he does not use drugs.   Family History:   family history includes Bladder Cancer in his father; Diabetes in his mother; Lupus in his sister.    Review of Systems: Review of Systems  Constitutional: Negative.   HENT: Negative.    Respiratory: Negative.    Cardiovascular: Negative.   Gastrointestinal: Negative.   Musculoskeletal: Negative.   Neurological: Negative.   Psychiatric/Behavioral: Negative.    All other systems reviewed and are negative.   PHYSICAL EXAM: VS:  BP 116/72 (BP Location: Right Arm, Patient Position: Sitting, Cuff Size: Normal)   Pulse 60   Ht 6' (1.829 m)   Wt 221 lb (100.2 kg)   SpO2 98%   BMI 29.97 kg/m  , BMI Body mass index is 29.97 kg/m. GEN: Well nourished, well developed, in no acute distress HEENT: normal Neck: no JVD, carotid bruits, or masses Cardiac: RRR; no murmurs, rubs, or gallops,no edema Discoloration of skin bilateral below the knees Respiratory:  clear to auscultation bilaterally, normal work of breathing GI: soft, nontender, nondistended, + BS MS: no deformity or atrophy Skin: warm and dry, no rash Neuro:  Strength and sensation are intact Psych: euthymic mood, full affect  Recent Labs: 12/22/2022: B Natriuretic Peptide 70.8 04/05/2023: Magnesium  1.8 09/26/2023: ALT 24; BUN 14; Creatinine, Ser 0.77; Hemoglobin 14.6; Platelets 133; Potassium 4.3; Sodium 137; TSH 1.720    Lipid Panel Lab Results  Component Value Date   CHOL 138 09/26/2023   HDL 54 09/26/2023   LDLCALC 70 09/26/2023   TRIG 66 09/26/2023    Wt Readings from Last 3 Encounters:  10/23/23 221 lb (100.2 kg)  10/22/23 220 lb (99.8 kg)  09/24/23 221 lb (100.2 kg)      ASSESSMENT AND PLAN:  Problem List Items Addressed This Visit       Cardiology Problems   Primary hypertension   Relevant Orders   EKG 12-Lead (Completed)   PAD (peripheral artery disease)   Relevant Orders   EKG 12-Lead (Completed)     Other   Tachycardia with hypertension   Relevant Orders   EKG 12-Lead (Completed)   Alcohol  withdrawal (HCC) - Primary   Alcoholic hepatitis (HCC)   Leg swelling Minimal on today's visit, not even trace pitting -Prior swelling in the setting of malnutrition, low protein, anemia In March 2025 protein level 2.7, hemoglobin 10 -Numbers were still moderately depressed in July 2025 - Numbers have improved since then now normalized with alcohol  cessation and better nutrition - Recommend he take Lasix 20 mg as needed sparingly for worsening ankle swelling - Unable to exclude some mild lymph damage or venous insufficiency from prior chronic swelling over the past 2 years in the setting of alcohol  abuse - Suggest he wear compression hose, leg elevation as needed  Sinus tachycardia Normalized rate off alcohol  with anemia resolved  better nutrition on metoprolol  succinate 50 daily -No further workup needed  Alcohol  abuse Numerous hospitalizations over the past 2 years for alcohol  withdrawal, liver dysfunction, cirrhosis -He reports that he has follow-up with hepatology - Discussed his liver function numbers which have normalized off alcohol  with better nutrition  Anemia Likely secondary to poor nutrition worse in March 2025, numbers now normalized with better nutrition  Seen in consultation for [employer] and will be referred back to her office for ongoing care of the issues detailed above    Signed, Velinda Lunger, M.D., Ph.D. [facility] [place], [place] [phone]

## 2023-10-22 NOTE — Progress Notes (Unsigned)
 Established Patient Office Visit  Subjective:  Patient ID: Lawrence Rowe, male    DOB: 02/22/1970  Age: 53 y.o. MRN: [id number]  Chief Complaint  Patient presents with   Follow-up    4 weeks follow up    Patient in office for 4 week follow up. Patient reports feeling better. Started on Lexapro  5 mg at previous visit. Will increase Lexapro  to 10 mg daily.  Discussed recent lab work. Labs within normal limits. Patient referred to psychiatry at previous visit, has not scheduled. Patient to call to schedule. Patient referred to physical therapy at previous visit, has not schedule. Patient to call to schedule.  Patient referred to GI at previous visit, patient did not receive a call to schedule. Will resend referral.     No other concerns at this time.   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)    GERD (gastroesophageal reflux disease)    Hypertension     Past Surgical History:  Procedure Laterality Date   ELBOW SURGERY Left     Social History   Socioeconomic History   Marital status: Single    Spouse name: Not on file   Number of children: Not on file   Years of education: Not on file   Highest education level: Not on file  Occupational History   Not on file  Tobacco Use   Smoking status: Former    Current packs/day: 0.00    Types: Cigarettes    Quit date: 2020    Years since quitting: 5.7   Smokeless tobacco: Never  Vaping Use   Vaping status: Never Used  Substance and Sexual Activity   Alcohol  use: Not Currently    Alcohol /week: 12.0 standard drinks of alcohol     Types: 12 Cans of beer per week    Comment: daily 12 pk-- Patient has recently quit drinking; 07/11/2023.   Drug use: Never   Sexual activity: Not Currently  Other Topics Concern   Not on file  Social History Narrative   Not on file   Social Drivers of Health   Financial Resource Strain: Not on  file  Food Insecurity: No Food Insecurity (07/21/2023)   Hunger Vital Sign    Worried About Running Out of Food in the Last Year: Never true    Ran Out of Food in the Last Year: Never true  Transportation Needs: No Transportation Needs (07/21/2023)   PRAPARE - Administrator, Civil Service (Medical): No    Lack of Transportation (Non-Medical): No  Physical Activity: Not on file  Stress: Not on file  Social Connections: Not on file  Intimate Partner Violence: Not At Risk (07/21/2023)   Humiliation, Afraid, Rape, and Kick questionnaire    Fear of Current or Ex-Partner: No    Emotionally Abused: No    Physically Abused: No    Sexually Abused: No    Family History  Problem Relation Age of Onset   Diabetes Mother    Bladder Cancer Father    Lupus Sister    Alcohol  abuse Neg Hx     Allergies  Allergen Reactions   Penicillins Other (See Comments)    Outpatient Medications Prior to Visit  Medication Sig   folic acid  (FOLVITE ) 1 MG tablet Take 1 tablet (1 mg total) by mouth daily.   hydrOXYzine  (ATARAX ) 10 MG tablet Take 1 tablet (10 mg total) by mouth 3 (  three) times daily as needed.   metoprolol  succinate (TOPROL -XL) 50 MG 24 hr tablet Take 1 tablet (50 mg total) by mouth daily. Take with or immediately following a meal.   Multiple Vitamin (MULTIVITAMIN WITH MINERALS) TABS tablet Take 1 tablet by mouth daily.   [START ON 12/12/2023] Secukinumab (COSENTYX UNOREADY) 300 MG/2ML SOAJ Inject 300 mg into the skin once a week.   [START ON 01/16/2024] Secukinumab (COSENTYX UNOREADY) 300 MG/2ML SOAJ Inject 300 mg into the skin every 28 (twenty-eight) days. (Patient not taking: Reported on 10/23/2023)   thiamine  (VITAMIN B-1) 100 MG tablet Take 1 tablet (100 mg total) by mouth daily.   triamcinolone  cream (KENALOG ) 0.1 % Apply twice daily to affected areas until improved, for psoriasis. Avoid applying to face, groin, and axilla.   [DISCONTINUED] escitalopram  (LEXAPRO ) 5 MG tablet Take  1 tablet (5 mg total) by mouth daily.   No facility-administered medications prior to visit.    Review of Systems  Constitutional: Negative.   HENT: Negative.    Eyes: Negative.   Respiratory: Negative.  Negative for shortness of breath.   Cardiovascular: Negative.  Negative for chest pain.  Gastrointestinal: Negative.  Negative for abdominal pain, constipation and diarrhea.  Genitourinary: Negative.   Musculoskeletal:  Negative for joint pain and myalgias.  Skin: Negative.   Neurological: Negative.  Negative for dizziness and headaches.  Endo/Heme/Allergies: Negative.   All other systems reviewed and are negative.      Objective:   BP 114/80   Pulse 63   Ht 6' (1.829 m)   Wt 220 lb (99.8 kg)   SpO2 99%   BMI 29.84 kg/m   Vitals:   10/22/23 1309  BP: 114/80  Pulse: 63  Height: 6' (1.829 m)  Weight: 220 lb (99.8 kg)  SpO2: 99%  BMI (Calculated): 29.83    Physical Exam Nursing note reviewed.  Constitutional:      Appearance: Normal appearance. He is normal weight.  HENT:     Head: Normocephalic and atraumatic.     Nose: Nose normal.     Mouth/Throat:     Mouth: Mucous membranes are moist.     Pharynx: Oropharynx is clear.  Eyes:     Extraocular Movements: Extraocular movements intact.     Conjunctiva/sclera: Conjunctivae normal.     Pupils: Pupils are equal, round, and reactive to light.  Cardiovascular:     Rate and Rhythm: Normal rate and regular rhythm.     Pulses: Normal pulses.     Heart sounds: Normal heart sounds.  Pulmonary:     Effort: Pulmonary effort is normal.     Breath sounds: Normal breath sounds.  Abdominal:     General: Abdomen is flat. Bowel sounds are normal.     Palpations: Abdomen is soft.  Musculoskeletal:        General: Normal range of motion.     Cervical back: Normal range of motion.  Skin:    General: Skin is warm and dry.  Neurological:     General: No focal deficit present.     Mental Status: He is alert and oriented  to person, place, and time.  Psychiatric:        Mood and Affect: Mood normal.        Behavior: Behavior normal.        Thought Content: Thought content normal.        Judgment: Judgment normal.      No results found for any visits on 10/22/23.  Recent Results (from the past 2160 hours)  Hep B Surface Antibody     Status: None   Collection Time: 08/07/23  3:35 PM  Result Value Ref Range   Hep B Surface Ab, Qual Non Reactive     Comment:              Non Reactive: Not immune to HBV infection.              Equivocal: Unable to determine if anti-HBs                         is present at levels consistent                         with immunity.               Reactive: Anti-HBs concentration detected                         at greater than 10 mIU/mL.                         Individual is considered to be                         immune to infection with HBV.   Hep B Surface Antigen     Status: None   Collection Time: 08/07/23  3:35 PM  Result Value Ref Range   Hepatitis B Surface Ag Negative Negative  Hepatitis B core antibody, total     Status: None   Collection Time: 08/07/23  3:35 PM  Result Value Ref Range   Hep B Core Total Ab Negative Negative  QuantiFERON-TB Gold Plus     Status: None   Collection Time: 08/07/23  3:35 PM  Result Value Ref Range   QuantiFERON Incubation Incubation performed.    QuantiFERON Criteria Comment     Comment: QuantiFERON-TB Gold Plus is a qualitative indirect test for M tuberculosis infection (including disease) and is intended for use in conjunction with risk assessment, radiography, and other medical and diagnostic evaluations. The QuantiFERON-TB Gold Plus result is determined by subtracting the Nil value from either TB antigen (Ag) value. The Mitogen tube serves as a control for the test.    QuantiFERON TB1 Ag Value 0.02 IU/mL   QuantiFERON TB2 Ag Value 0.02 IU/mL   QuantiFERON Nil Value 0.02 IU/mL   QuantiFERON Mitogen Value 2.68 IU/mL    QuantiFERON-TB Gold Plus Negative Negative    Comment: No response to M tuberculosis antigens detected. Infection with M tuberculosis is unlikely, but high risk individuals should be considered for additional testing (ATS/IDSA/CDC Clinical Practice Guidelines, 2017). The reference range is an Antigen minus Nil result of <0.35 IU/mL. Chemiluminescence immunoassay methodology   CMP14+EGFR     Status: Abnormal   Collection Time: 09/26/23  1:15 PM  Result Value Ref Range   Glucose 102 (H) 70 - 99 mg/dL   BUN 14 6 - 24 mg/dL   Creatinine, Ser 9.22 0.76 - 1.27 mg/dL   eGFR 893 >40 fO/fpw/8.26   BUN/Creatinine Ratio 18 9 - 20   Sodium 137 134 - 144 mmol/L   Potassium 4.3 3.5 - 5.2 mmol/L   Chloride 100 96 - 106 mmol/L   CO2 23 20 - 29 mmol/L   Calcium 9.7 8.7 - 10.2 mg/dL  Total Protein 7.7 6.0 - 8.5 g/dL   Albumin 4.1 3.8 - 4.9 g/dL   Globulin, Total 3.6 1.5 - 4.5 g/dL   Bilirubin Total 1.2 0.0 - 1.2 mg/dL   Alkaline Phosphatase 74 47 - 123 IU/L    Comment:               **Please note reference interval change**   AST 33 0 - 40 IU/L   ALT 24 0 - 44 IU/L  TSH     Status: None   Collection Time: 09/26/23  1:16 PM  Result Value Ref Range   TSH 1.720 0.450 - 4.500 uIU/mL  Hemoglobin A1c     Status: None   Collection Time: 09/26/23  1:16 PM  Result Value Ref Range   Hgb A1c MFr Bld 5.4 4.8 - 5.6 %    Comment:          Prediabetes: 5.7 - 6.4          Diabetes: >6.4          Glycemic control for adults with diabetes: <7.0    Est. average glucose Bld gHb Est-mCnc 108 mg/dL  CBC With Diff/Platelet     Status: Abnormal   Collection Time: 09/26/23  1:16 PM  Result Value Ref Range   WBC 6.2 3.4 - 10.8 x10E3/uL   RBC 4.47 4.14 - 5.80 x10E6/uL   Hemoglobin 14.6 13.0 - 17.7 g/dL   Hematocrit 55.5 62.4 - 51.0 %   MCV 99 (H) 79 - 97 fL   MCH 32.7 26.6 - 33.0 pg   MCHC 32.9 31.5 - 35.7 g/dL   RDW 84.0 (H) 88.3 - 84.5 %   Platelets 133 (L) 150 - 450 x10E3/uL   Neutrophils 57 Not Estab.  %   Lymphs 25 Not Estab. %   Monocytes 15 Not Estab. %   Eos 2 Not Estab. %   Basos 1 Not Estab. %   Neutrophils Absolute 3.5 1.4 - 7.0 x10E3/uL   Lymphocytes Absolute 1.6 0.7 - 3.1 x10E3/uL   Monocytes Absolute 1.0 (H) 0.1 - 0.9 x10E3/uL   EOS (ABSOLUTE) 0.1 0.0 - 0.4 x10E3/uL   Basophils Absolute 0.0 0.0 - 0.2 x10E3/uL   Immature Granulocytes 0 Not Estab. %   Immature Grans (Abs) 0.0 0.0 - 0.1 x10E3/uL  Lipid panel     Status: None   Collection Time: 09/26/23  1:16 PM  Result Value Ref Range   Cholesterol, Total 138 100 - 199 mg/dL   Triglycerides 66 0 - 149 mg/dL   HDL 54 >60 mg/dL   VLDL Cholesterol Cal 14 5 - 40 mg/dL   LDL Chol Calc (NIH) 70 0 - 99 mg/dL   Chol/HDL Ratio 2.6 0.0 - 5.0 ratio    Comment:                                   T. Chol/HDL Ratio                                             Men  Women                               1/2 Avg.Risk  3.4    3.3  Avg.Risk  5.0    4.4                                2X Avg.Risk  9.6    7.1                                3X Avg.Risk 23.4   11.0       Assessment & Plan:  Increase Lexapro  to 10 mg daily Schedule with psychiatry Schedule with PT Schedule with GI  Problem List Items Addressed This Visit       Cardiovascular and Mediastinum   Primary hypertension     Digestive   Alcoholic hepatitis (HCC)   Relevant Orders   Ambulatory referral to Gastroenterology   Fatty liver   Relevant Orders   Ambulatory referral to Gastroenterology     Other   Abnormal liver function   Relevant Orders   Ambulatory referral to Gastroenterology   Anxiety and depression - Primary   Relevant Medications   escitalopram  (LEXAPRO ) 10 MG tablet    Return in about 3 months (around 01/22/2024).   Total time spent: 25 minutes  [employer], NP  10/22/2023   This document may have been prepared by Dragon Voice Recognition software and as such may include unintentional dictation errors.

## 2023-10-23 ENCOUNTER — Ambulatory Visit: Payer: MEDICAID | Attending: Cardiovascular Disease | Admitting: Cardiovascular Disease

## 2023-10-23 ENCOUNTER — Encounter: Payer: Self-pay | Admitting: Cardiovascular Disease

## 2023-10-23 VITALS — BP 116/72 | HR 60 | Ht 72.0 in | Wt 221.0 lb

## 2023-10-23 DIAGNOSIS — I739 Peripheral vascular disease, unspecified: Secondary | ICD-10-CM | POA: Insufficient documentation

## 2023-10-23 DIAGNOSIS — F10930 Alcohol use, unspecified with withdrawal, uncomplicated: Secondary | ICD-10-CM | POA: Insufficient documentation

## 2023-10-23 DIAGNOSIS — R000 Tachycardia, unspecified: Secondary | ICD-10-CM | POA: Insufficient documentation

## 2023-10-23 DIAGNOSIS — I5032 Chronic diastolic (congestive) heart failure: Secondary | ICD-10-CM

## 2023-10-23 DIAGNOSIS — K7010 Alcoholic hepatitis without ascites: Secondary | ICD-10-CM | POA: Diagnosis present

## 2023-10-23 DIAGNOSIS — I10 Essential (primary) hypertension: Secondary | ICD-10-CM | POA: Diagnosis not present

## 2023-10-23 DIAGNOSIS — F419 Anxiety disorder, unspecified: Secondary | ICD-10-CM | POA: Insufficient documentation

## 2023-10-23 MED ORDER — FUROSEMIDE 20 MG PO TABS: 20.0000 mg | ORAL_TABLET | Freq: Every day | ORAL | 1 refills | Status: DC | PRN

## 2023-10-23 NOTE — Patient Instructions (Addendum)
 Medication Instructions:   Lasix 20 mg daily as needed for significant ankle swelling  If you need a refill on your cardiac medications before your next appointment, please call your pharmacy.   Lab work: No new labs needed  Testing/Procedures: No new testing needed  Follow-Up: At [facility], you and your health needs are our priority.  As part of our continuing mission to provide you with exceptional heart care, we have created designated Provider Care Teams.  These Care Teams include your primary Cardiologist (physician) and Advanced Practice Providers (APPs -  Physician Assistants and Nurse Practitioners) who all work together to provide you with the care you need, when you need it.  You will need a follow up appointment as needed  Providers on your designated Care Team:   Lonni Meager, NP Bernardino Bring, PA-C Cadence [place], [place]  COVID-19 Vaccine Information can be found at: [email or web address] For questions related to vaccine distribution or appointments, please email [email or web address] or call [phone].

## 2023-10-24 ENCOUNTER — Ambulatory Visit (INDEPENDENT_AMBULATORY_CARE_PROVIDER_SITE_OTHER): Payer: MEDICAID | Admitting: Psychiatry

## 2023-10-24 ENCOUNTER — Ambulatory Visit: Payer: MEDICAID

## 2023-10-24 ENCOUNTER — Encounter: Payer: Self-pay | Admitting: Psychiatry

## 2023-10-24 VITALS — BP 104/68 | HR 59 | Ht 72.0 in | Wt 219.4 lb

## 2023-10-24 DIAGNOSIS — F1021 Alcohol dependence, in remission: Secondary | ICD-10-CM | POA: Diagnosis not present

## 2023-10-24 DIAGNOSIS — F419 Anxiety disorder, unspecified: Secondary | ICD-10-CM | POA: Diagnosis not present

## 2023-10-24 DIAGNOSIS — F1994 Other psychoactive substance use, unspecified with psychoactive substance-induced mood disorder: Secondary | ICD-10-CM

## 2023-10-24 MED ORDER — MIRTAZAPINE 7.5 MG PO TABS: 7.5000 mg | ORAL_TABLET | Freq: Every day | ORAL | 1 refills | Status: DC

## 2023-10-24 NOTE — Progress Notes (Signed)
 Psychiatric Initial Adult Assessment   Patient Identification: Lawrence Rowe MRN:  [id number] Date of Evaluation:  10/24/2023 Referral Source: Jeoffrey Pollen NP  Chief Complaint:   Chief Complaint  Patient presents with   Establish Care   Anxiety   Depression   Alcohol  Problem   Visit Diagnosis:    ICD-10-CM   1. Alcohol  use disorder, severe, in early remission (HCC)  F10.21     2. Substance or medication-induced depressive disorder (HCC)  F19.94 mirtazapine (REMERON) 7.5 MG tablet   alcohol     3. Anxiety disorder, unspecified type  F41.9 mirtazapine (REMERON) 7.5 MG tablet      Discussed the use of AI scribe software for clinical note transcription with the patient, who gave verbal consent to proceed.  History of Present Illness Lawrence Rowe is a 53 year old Caucasian male, single, currently lives in [place] with a friend, unemployed, has a history of alcoholism, hypertension, chronic diastolic congestive heart failure, anxiety, hepatic cirrhosis, Wernicke's syndrome, GERD, was evaluated in office today, presented to establish care.  He describes a history of heavy alcohol  use, noting the onset of binge drinking in his early 30s and consumption peaking at 12 to 18 beers per day. He reports abstaining from alcohol  since July, approximately 4 months prior to this visit, and initially experienced cravings that have since resolved. He identifies his ex-girlfriend Lauraine as his primary support in maintaining abstinence. He denies current cravings for alcohol  and reports no history of withdrawal seizures or blackouts, though he notes occasional night sweats during withdrawal. He characterizes his alcohol  use as primarily for enjoyment and routine, rather than to manage mood symptoms.  He continues to experience ongoing symptoms of insomnia, with him going to bed around 9 or 10 PM and waking at 3 AM, resulting in difficulty achieving 8 to 9 hours of sleep. He notes that  sleep difficulties predated abstinence and occurred during periods of active drinking. He sometimes experiences low energy, which he attributes in part to physical health issues, and has started physical therapy for lower extremity weakness and balance problems. He reports periods of low mood, decreased interest and pleasure in activities, and acknowledges feeling down and depressed several days in the past 2 weeks. He states that his appetite is currently good and that his ex-girlfriend ensures he eats well. He denies any history of manic or hypomanic symptoms.  He experiences some anxiety, describing himself as a deliberate thinker who occasionally considers worst-case scenarios but does not worry excessively or experience racing thoughts. He notes that anxiety may contribute to his sleep difficulties. He denies obsessive-compulsive symptoms, psychosis, or perceptual disturbances. He denies thoughts of harming himself or others. He reports no history of hospitalizations or outpatient therapy for mental health or substance use.  He currently takes Lexapro , which his primary provider started 1 to 2 months ago and recently increased in dose to 10 mg, though he has not yet filled the higher dose. He finds Lexapro  helpful for his mood and sleep. He also takes hydroxyzine  as needed for anxiety and sleep, and vitamin B1 (thiamine ) and folic acid  as part of his medical regimen. He describes his support system as limited, primarily consisting of his ex-girlfriend Lauraine, and notes minimal contact with his mother and sister.    Associated Signs/Symptoms: Depression Symptoms:  depressed mood, anhedonia, insomnia, anxiety, (Hypo) Manic Symptoms:  Denies Anxiety Symptoms:  anxiety episodic Psychotic Symptoms:  denies PTSD Symptoms: Negative  Past Psychiatric History: He denies any prior psychiatric diagnosis other  than alcoholism.  He reports never having tried antidepressants, anti-anxiety agents, or sleep  medications in the past other than recently being started on Lexapro  by primary provider.He denies any history of trauma, including physical, sexual, or emotional abuse, or other significant traumatic events.  He denies past hospital admissions for psychiatric reasons.  Previous Psychotropic Medications: Yes Lexapro   Substance Abuse History in the last 12 months:  Yes.   He denies use of tobacco, chewing tobacco, or snuff. He reports trying cannabis in the 1990s but did not find it effective and did not continue use. He denies use of other illicit drugs.  He reports significant use of alcohol  since early 30s up to 12 cans of beer per day.  He reports episodes of binging on alcohol .  Multiple hospitalization for alcohol  related problems.  He was hospitalized-January 2024, December 2024, March 2025, July 2025 for alcohol  withdrawal symptoms, DT.  Per review of medical records-medical complications related to alcohol  use, including liver disease with abnormal liver enzymes and possible cirrhosis, lower extremity swelling, and a diagnosis of Wernicke's syndrome and congestive heart failure.  He states he has not experienced legal issues such as DUIs or DWIs.   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  Family History:  Family History  Problem Relation Age of Onset   Diabetes Mother    Bladder Cancer Father    Lupus Sister    Alcohol  abuse Neg Hx    Mental illness Neg Hx     Social History:   Social History   Socioeconomic History    Marital status: Single    Spouse name: Not on file   Number of children: Not on file   Years of education: Not on file   Highest education level: Bachelor's degree (e.g., BA, AB, BS)  Occupational History   Not on file  Tobacco Use   Smoking status: Former    Current packs/day: 0.00    Types: Cigarettes    Quit date: 2020    Years since quitting: 5.7   Smokeless tobacco: Never  Vaping Use   Vaping status: Never Used  Substance and Sexual Activity   Alcohol  use: Not Currently    Alcohol /week: 12.0 standard drinks of alcohol     Types: 12 Cans of beer per week    Comment: daily 12 pk-- Patient has recently quit drinking; 07/11/2023.   Drug use: Not Currently    Types: Marijuana   Sexual activity: Not Currently  Other Topics Concern   Not on file  Social History Narrative   Not on file   Social Drivers of Health   Financial Resource Strain: Not on file  Food Insecurity: No Food Insecurity (07/21/2023)   Hunger Vital Sign    Worried About Running Out of Food in the Last Year: Never true    Ran Out of Food in the Last Year: Never true  Transportation Needs: No Transportation Needs (07/21/2023)   PRAPARE - Administrator, Civil Service (Medical): No    Lack of Transportation (Non-Medical): No  Physical Activity: Not on file  Stress: Not on file  Social Connections: Not on file    Additional Social History: He was born in [place].  He grew up in [place].  Raised by both parents.  Reports he had a normal childhood.  1 sister.  His mother and sister live in the area.  His father is currently deceased.  He is single.  He denies having children.  He completed a BS in Lobbyist. He is not currently employed and stopped working in early 2020. He lives in a house he has owned for 18 years and currently stays with his ex-girlfriend Lauraine, who provides support. He plans to sell his house and move within [place] , possibly to [place]. He identifies Lauraine as  his primary support. He describes himself as sometimes agnostic, sometimes spiritual. He enjoys listening to classic rock and blues music and watching games.  Denies any history of legal problems.  Denies access to a gun.  Allergies:   Allergies  Allergen Reactions   Penicillins Other (See Comments)    Metabolic Disorder Labs: Lab Results  Component Value Date   HGBA1C 5.4 09/26/2023   No results found for: PROLACTIN Lab Results  Component Value Date   CHOL 138 09/26/2023   TRIG 66 09/26/2023   HDL 54 09/26/2023   CHOLHDL 2.6 09/26/2023   LDLCALC 70 09/26/2023   LDLCALC 115 (H) 05/06/2021   Lab Results  Component Value Date   TSH 1.720 09/26/2023    Therapeutic Level Labs: No results found for: LITHIUM No results found for: CBMZ No results found for: VALPROATE  Current Medications: Current Outpatient Medications  Medication Sig Dispense Refill   escitalopram  (LEXAPRO ) 10 MG tablet Take 1 tablet (10 mg total) by mouth daily. 30 tablet 3   folic acid  (FOLVITE ) 1 MG tablet Take 1 tablet (1 mg total) by mouth daily. 90 tablet 1   furosemide (LASIX) 20 MG tablet Take 1 tablet (20 mg total) by mouth daily as needed. 30 tablet 1   hydrOXYzine  (ATARAX ) 10 MG tablet Take 1 tablet (10 mg total) by mouth 3 (three) times daily as needed. 90 tablet 1   metoprolol  succinate (TOPROL -XL) 50 MG 24 hr tablet Take 1 tablet (50 mg total) by mouth daily. Take with or immediately following a meal. 90 tablet 3   mirtazapine (REMERON) 7.5 MG tablet Take 1 tablet (7.5 mg total) by mouth at bedtime. 30 tablet 1   Multiple Vitamin (MULTIVITAMIN WITH MINERALS) TABS tablet Take 1 tablet by mouth daily. 30 tablet 0   [START ON 12/12/2023] Secukinumab (COSENTYX UNOREADY) 300 MG/2ML SOAJ Inject 300 mg into the skin once a week. 10 mL 0   [START ON 01/16/2024] Secukinumab (COSENTYX UNOREADY) 300 MG/2ML SOAJ Inject 300 mg into the skin every 28 (twenty-eight) days. 2 mL 10   thiamine  (VITAMIN B-1)  100 MG tablet Take 1 tablet (100 mg total) by mouth daily. 90 tablet 1   triamcinolone  cream (KENALOG ) 0.1 % Apply twice daily to affected areas until improved, for psoriasis. Avoid applying to face, groin, and axilla. 454 g 2   No current facility-administered medications for this visit.    Musculoskeletal: Strength & Muscle Tone: within normal limits Gait & Station: unsteady, Slow Patient leans: N/A  Psychiatric Specialty Exam: Review of Systems  Psychiatric/Behavioral:  Positive for dysphoric mood. The patient is nervous/anxious.     Blood pressure 104/68, pulse (!) 59, height 6' (1.829 m), weight 219 lb 6.4 oz (99.5 kg),  SpO2 98%.Body mass index is 29.76 kg/m.  General Appearance: Casual  Eye Contact:  Fair  Speech:  Clear and Coherent  Volume:  Normal  Mood:  Anxious and Depressed  Affect:  Congruent  Thought Process:  Goal Directed and Descriptions of Associations: Intact  Orientation:  Full (Time, Place, and Person)  Thought Content:  Logical  Suicidal Thoughts:  No  Homicidal Thoughts:  No  Memory:  Immediate;   Fair Recent;   Fair Remote;   Fair  Judgement:  Fair  Insight:  Fair  Psychomotor Activity:  Normal  Concentration:  Concentration: Fair and Attention Span: Fair  Recall:  [employer] of Knowledge:Fair  Language: Fair  Akathisia:  No  Handed:  Right  AIMS (if indicated):  not done  Assets:  Communication Skills Desire for Improvement Housing Social Support Transportation  ADL's:  Intact  Cognition: WNL  Sleep:  Poor   Screenings: GAD-7    Flowsheet Row Office Visit from 10/24/2023 in [facility] Office Visit from 09/24/2023 in [facility]  Total GAD-7 Score 6 16   PHQ2-9    Flowsheet Row Office Visit from 10/24/2023 in [facility] Office Visit from 09/24/2023 in [facility] Office Visit from 04/19/2021 in [facility]  PHQ-2 Total Score 2 6 0  PHQ-9 Total Score 3 21 --   Flowsheet Row Office Visit from 10/24/2023 in [facility] ED to Hosp-Admission (Discharged) from 07/20/2023 in [facility] to Hosp-Admission (Discharged) from 04/05/2023 in [facility]  C-SSRS RISK CATEGORY No Risk No Risk No Risk    Assessment and Plan: ASHELY JOSHUA is a 53 year old Caucasian male, presented to establish care, discussed assessment and plan as noted below.  Assessment & Plan Alcohol  use disorder in early remission   He is in early remission from alcohol  use disorder, abstinent for four months with support from his ex-girlfriend. He has no current cravings and previously consumed 12-18 beers daily in a binge drinking pattern. There is no history of DUIs or legal issues related to alcohol . He is not currently participating in AA or substance use programs.  Refer him to substance abuse counselor Mr. Elsie Prim for therapy within the [facility] system. Recommend participation in AA meetings for support and accountability. Encourage continued abstinence from alcohol .  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 He is on Lexapro , recently increased to 10 mg. He reports insomnia, waking at 3 AM after going to bed at 9-10 PM. Anxiety symptoms include occasional panic attacks and a preference for orderliness, without extreme worry or OCD behaviors. There is no history of depression or anxiety prior to alcohol  use. Start Mirtazapine (Remeron) 7.5 mg at bedtime. Continue Lexapro  at the increased dose of 10 mg and monitor for side effects.  Educate on sleep hygiene practices.  Use Hydroxyzine  10 mg 3 times a day as needed for anxiety.  Anxiety unspecified-unstable Anxiety symptoms unspecified likely secondary to  alcoholism.  Currently does have hydroxyzine  available as needed. Recommend establishing care with therapist, I have referred him to Mr. Elsie Console in [facility]. Start Mirtazapine 7.5 mg at bedtime. Provided medication education.  I have reviewed labs including CBC with differential dated 09/26/2023-MCV abnormal at 99, platelet count low at 133 although uptrending.  TSH normal at 1.720, sodium normal at 137,  AST/ALT within normal limits.  I have reviewed US  abdomen-07/20/2023-probable hepatic cirrhosis with small amount of fluid noted around the liver.  Patient encouraged to follow-up with gastroenterologist as recommended.  Follow-up Follow-up in clinic in 4 weeks or sooner if needed.  Collaboration of Care: Referral or follow-up with counselor/therapist AEB I have referred this patient for psychotherapy/substance abuse counseling as noted above.  Patient also advised to follow-up with primary care provider/gastroenterology and cardiology.  Patient/Guardian was advised Release of Information must be obtained prior to any record release in order to collaborate their care with an outside provider. Patient/Guardian was advised if they have not already done so to contact the registration department to sign all necessary forms in order for us  to release information regarding their care.   Consent: Patient/Guardian gives verbal consent for treatment and assignment of benefits for services provided during this visit. Patient/Guardian expressed understanding and agreed to proceed.  This note was generated in part or whole with voice recognition software. Voice recognition is usually quite accurate but there are transcription errors that can and very often do occur. I apologize for any typographical errors that were not detected and corrected.    Marshelle Bilger, MD 10/17/20256:46 AM

## 2023-10-24 NOTE — Patient Instructions (Signed)
 Mirtazapine Tablets What is this medication? MIRTAZAPINE (mir TAZ a peen) treats depression. It increases the amount of serotonin and norepinephrine in the brain, substances that help regulate mood. This medicine may be used for other purposes; ask your health care provider or pharmacist if you have questions. COMMON BRAND NAME(S): Remeron What should I tell my care team before I take this medication? They need to know if you have any of these conditions: Bipolar disorder Dehydration Glaucoma Have had a stroke Heart or blood vessel conditions Kidney disease Liver disease Low blood pressure Low levels of sodium in the blood Low white blood cell levels Seizures Suicidal thoughts, plans, or attempt An unusual or allergic reaction to mirtazapine, other medications, foods, dyes, or preservatives Pregnant or trying to get pregnant Breastfeeding How should I use this medication? Take this medication by mouth with water. Take it as directed on the prescription label at the same time every day. You can take it with or without food. If it upsets your stomach, take it with food. Keep taking this medication unless your care team tells you to stop. Stopping it too quickly can cause serious side effects. It can also make your condition worse. A special MedGuide will be given to you by the pharmacist with each prescription and refill. Be sure to read this information carefully each time. Talk to your care team about the use of this medication in children. Special care may be needed. Overdosage: If you think you have taken too much of this medicine contact a poison control center or emergency room at once. NOTE: This medicine is only for you. Do not share this medicine with others. What if I miss a dose? If you miss a dose, take it as soon as you can. If it is almost time for your next dose, take only that dose. Do not take double or extra doses. What may interact with this medication? Do not take  this medication with any of the following: Cisapride Dronedarone Levoketoconazole Linezolid MAOIs, such as Marplan, Nardil, and Parnate Methylene blue Pimozide Some medications for fungal infections, such as ketoconazole, posaconazole, voriconazole Thioridazine This medication may also interact with the following: Alcohol  Benzodiazepines, such as lorazepam  Cimetidine Clarithromycin Other medications that cause heart rhythm changes Opioids Rifampin Some medications for depression, anxiety, or other mental health conditions Some medication for migraines, such as sumatriptan Some medications for seizures, such as carbamazepine or phenytoin Stimulant medications for ADHD, weight loss, or staying awake Supplements, such as St. John's wort or tryptophan Warfarin Other medications may affect the way this medication works. Talk with your care team about all the medications you take. They may suggest changes to your treatment plan to lower the risk of side effects and to make sure your medications work as intended. This list may not describe all possible interactions. Give your health care provider a list of all the medicines, herbs, non-prescription drugs, or dietary supplements you use. Also tell them if you smoke, drink alcohol , or use illegal drugs. Some items may interact with your medicine. What should I watch for while using this medication? Visit your care team for regular checks on your progress. It may be some time before you see the benefit from this medication. This medication may worsen depression and cause thoughts of suicide. This can happen at any time but is more common after first starting treatment and after a change in dose. Talk to your care team right away if you have changes in mood and behavior  or thoughts of self-harm or suicide. They can help you. This medication may affect your coordination, reaction time, or judgment. Do not drive or operate machinery until you know  how this medication affects you. Sit up or stand slowly to reduce the risk of dizzy or fainting spells. Drinking alcohol  with this medication can increase the risk of these side effects. Your mouth may get dry. Chewing sugarless gum or sucking hard candy and drinking plenty of water may help. Contact your care team if the problem does not go away or is severe. What side effects may I notice from receiving this medication? Side effects that you should report to your care team as soon as possible: Allergic reactions--skin rash, itching, hives, swelling of the face, lips, tongue, or throat Heart rhythm changes--fast or irregular heartbeat, dizziness, feeling faint or lightheaded, chest pain, trouble breathing Infection--fever, chills, cough, or sore throat Irritability, confusion, fast or irregular heartbeat, muscle stiffness, twitching muscles, sweating, high fever, seizure, chills, vomiting, diarrhea, which may be signs of serotonin syndrome Low sodium level--muscle weakness, fatigue, dizziness, headache, confusion Rash, fever, and swollen lymph nodes Redness, blistering, peeling, or loosening of the skin, including inside the mouth Seizures Sudden eye pain or change in vision such as blurry vision, seeing halos around lights, vision loss Thoughts of suicide or self-harm, worsening mood, feelings of depression Side effects that usually do not require medical attention (report these to your care team if they continue or are bothersome): Constipation Dizziness Drowsiness Dry mouth Increase in appetite Weight gain This list may not describe all possible side effects. Call your doctor for medical advice about side effects. You may report side effects to FDA at 1-800-FDA-1088. Where should I keep my medication? Keep out of the reach of children and pets. Store at room temperature between 20 and 25 degrees C (68 and 77 degrees F). Protect from light and moisture. Keep the container tightly closed.  Get rid of any unused medication after the expiration date. To get rid of medications that are no longer needed or have expired: Take the medication to a take-back program. Check with your pharmacy or law enforcement to find a location. If you cannot return the medication, check the label or package insert to see if the medication should be thrown out in the garbage or flushed down the toilet. If you are not sure, ask your care team. If it is safe to put it in the trash, empty the medication out of the container. Mix it with cat litter, dirt, coffee grounds, or another unwanted substance. Seal the mixture in a bag or container. Put it in the trash. NOTE: This sheet is a summary. It may not cover all possible information. If you have questions about this medicine, talk to your doctor, pharmacist, or health care provider.  2025 Elsevier/Gold Standard (2023-04-25 00:00:00)Insomnia Insomnia is a sleep disorder that makes it difficult to fall asleep or stay asleep. Insomnia can cause fatigue, low energy, difficulty concentrating, mood swings, and poor performance at work or school. There are three different ways to classify insomnia: Difficulty falling asleep. Difficulty staying asleep. Waking up too early in the morning. Any type of insomnia can be long-term (chronic) or short-term (acute). Both are common. Short-term insomnia usually lasts for 3 months or less. Chronic insomnia occurs at least three times a week for longer than 3 months. What are the causes? Insomnia may be caused by another condition, situation, or substance, such as: Having certain mental health conditions, such as anxiety  and depression. Using caffeine, alcohol , tobacco, or drugs. Having gastrointestinal conditions, such as gastroesophageal reflux disease (GERD). Having certain medical conditions. These include: Asthma. Alzheimer's disease. Stroke. Chronic pain. An overactive thyroid  gland (hyperthyroidism). Other sleep  disorders, such as restless legs syndrome and sleep apnea. Menopause. Sometimes, the cause of insomnia may not be known. What increases the risk? Risk factors for insomnia include: Gender. Females are affected more often than males. Age. Insomnia is more common as people get older. Stress and certain medical and mental health conditions. Lack of exercise. Having an irregular work schedule. This may include working night shifts and traveling between different time zones. What are the signs or symptoms? If you have insomnia, the main symptom is having trouble falling asleep or having trouble staying asleep. This may lead to other symptoms, such as: Feeling tired or having low energy. Feeling nervous about going to sleep. Not feeling rested in the morning. Having trouble concentrating. Feeling irritable, anxious, or depressed. How is this diagnosed? This condition may be diagnosed based on: Your symptoms and medical history. Your health care provider may ask about: Your sleep habits. Any medical conditions you have. Your mental health. A physical exam. How is this treated? Treatment for insomnia depends on the cause. Treatment may focus on treating an underlying condition that is causing the insomnia. Treatment may also include: Medicines to help you sleep. Counseling or therapy. Lifestyle adjustments to help you sleep better. Follow these instructions at home: Eating and drinking  Limit or avoid alcohol , caffeinated beverages, and products that contain nicotine and tobacco, especially close to bedtime. These can disrupt your sleep. Do not eat a large meal or eat spicy foods right before bedtime. This can lead to digestive discomfort that can make it hard for you to sleep. Sleep habits  Keep a sleep diary to help you and your health care provider figure out what could be causing your insomnia. Write down: When you sleep. When you wake up during the night. How well you sleep and  how rested you feel the next day. Any side effects of medicines you are taking. What you eat and drink. Make your bedroom a dark, comfortable place where it is easy to fall asleep. Put up shades or blackout curtains to block light from outside. Use a white noise machine to block noise. Keep the temperature cool. Limit screen use before bedtime. This includes: Not watching TV. Not using your smartphone, tablet, or computer. Stick to a routine that includes going to bed and waking up at the same times every day and night. This can help you fall asleep faster. Consider making a quiet activity, such as reading, part of your nighttime routine. Try to avoid taking naps during the day so that you sleep better at night. Get out of bed if you are still awake after 15 minutes of trying to sleep. Keep the lights down, but try reading or doing a quiet activity. When you feel sleepy, go back to bed. General instructions Take over-the-counter and prescription medicines only as told by your health care provider. Exercise regularly as told by your health care provider. However, avoid exercising in the hours right before bedtime. Use relaxation techniques to manage stress. Ask your health care provider to suggest some techniques that may work well for you. These may include: Breathing exercises. Routines to release muscle tension. Visualizing peaceful scenes. Make sure that you drive carefully. Do not drive if you feel very sleepy. Keep all follow-up visits. This is important.  Contact a health care provider if: You are tired throughout the day. You have trouble in your daily routine due to sleepiness. You continue to have sleep problems, or your sleep problems get worse. Get help right away if: You have thoughts about hurting yourself or someone else. Get help right away if you feel like you may hurt yourself or others, or have thoughts about taking your own life. Go to your nearest emergency room  or: Call 911. Call the National Suicide Prevention Lifeline at 930-605-3063 or 988. This is open 24 hours a day. Text the Crisis Text Line at 534-283-2476. Summary Insomnia is a sleep disorder that makes it difficult to fall asleep or stay asleep. Insomnia can be long-term (chronic) or short-term (acute). Treatment for insomnia depends on the cause. Treatment may focus on treating an underlying condition that is causing the insomnia. Keep a sleep diary to help you and your health care provider figure out what could be causing your insomnia. This information is not intended to replace advice given to you by your health care provider. Make sure you discuss any questions you have with your health care provider. Document Revised: 12/06/2020 Document Reviewed: 12/06/2020 Elsevier Patient Education  2024 [employer].

## 2023-10-25 ENCOUNTER — Telehealth (HOSPITAL_COMMUNITY): Payer: Self-pay | Admitting: Licensed Clinical Social Worker

## 2023-10-25 ENCOUNTER — Ambulatory Visit: Payer: MEDICAID | Attending: Cardiology

## 2023-10-25 VITALS — BP 145/85 | HR 59

## 2023-10-25 DIAGNOSIS — R262 Difficulty in walking, not elsewhere classified: Secondary | ICD-10-CM | POA: Diagnosis present

## 2023-10-25 DIAGNOSIS — R29898 Other symptoms and signs involving the musculoskeletal system: Secondary | ICD-10-CM | POA: Insufficient documentation

## 2023-10-25 DIAGNOSIS — M6281 Muscle weakness (generalized): Secondary | ICD-10-CM | POA: Diagnosis present

## 2023-10-25 NOTE — Telephone Encounter (Signed)
 The therapist reaches out to [place] per a request by his psychiatrist.  The therapist leaves a HIPAA compliant voicemail with his direct contact number which is [phone].  Zell Maier, MA, LCSW, [facility], LCAS 10/25/2023

## 2023-10-25 NOTE — Therapy (Signed)
 OUTPATIENT PHYSICAL THERAPY EVALUATION  Patient Name: Lawrence Rowe MRN: [id number] DOB:1970/11/22, 53 y.o., male Today's Date: 10/25/2023  PCP: Freddrick, No REFERRING PROVIDER: Carin Gauze, NP  END OF SESSION:  PT End of Session - 10/25/23 1353     Visit Number 1    Number of Visits 12    Date for Recertification  12/06/23    Authorization Type [employer]    Authorization Time Period 10/25/23-12/06/23    Progress Note Due on Visit 10    PT Start Time 1400    PT Stop Time 1440    PT Time Calculation (min) 40 min    Activity Tolerance Patient tolerated treatment well;No increased pain    Behavior During Therapy WFL for tasks assessed/performed          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)    GERD (gastroesophageal reflux disease)    Hypertension    Past Surgical History:  Procedure Laterality Date   ELBOW SURGERY Left    Patient Active Problem List   Diagnosis Date Noted   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   Anxiety disorder 10/23/2023   Encounter to establish care 09/24/2023   Alcohol  withdrawal delirium, acute, hypoactive (HCC) 07/20/2023   Elevated LFTs 12/22/2022   Serum total bilirubin elevated 12/22/2022   GERD (gastroesophageal reflux disease) 12/22/2022   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   Lactic acidosis 06/14/2022   Alcohol  withdrawal with inpatient treatment (HCC) 06/08/2022   High anion gap metabolic acidosis 06/08/2022   Overweight (BMI 25.0-29.9) 01/12/2022   Fatty liver 01/12/2022   Abnormal liver function 01/11/2022   Wernicke encephalopathy syndrome 12/27/2021   Delirium tremens (HCC) 12/27/2021   Alcoholic hepatitis (HCC) 12/27/2021   Alcohol  withdrawal (HCC) 12/23/2021   Transaminitis 12/23/2021   Unilateral edema of lower extremity 12/23/2021   Dermatitis 05/10/2021   Primary hypertension  05/10/2021   Tachycardia with hypertension 04/19/2021   PAD (peripheral artery disease) 04/19/2021   Alcohol  use disorder, severe, in early remission (HCC) 04/19/2021   Metabolic syndrome 04/19/2021   ONSET DATE: chronic progressive REFERRING DIAG: Weakness in legs   THERAPY DIAG:  Muscle weakness (generalized)  Difficulty in walking, not elsewhere classified  Rationale for Evaluation and Treatment: Rehabilitation  SUBJECTIVE:                                                                                                                                                                                             SUBJECTIVE STATEMENT: Pt has noticed some changes in  leg strength/balance that have not improved since his most recent line of hospitalizations.   PERTINENT HISTORY:   53yoM referred to OPPT by cardiology at recent visit while being established. Pt is s/p multiple recent admissions for ETOH abuse/withdrawal, reports persistent weakness in legs and imbalance since getting sober in July. Pt reports no prior difficulty with these issues. Pt is fully independent at present, uses no device for mobility, no falls history, but is concerned about confidence with stairs performance and feels he could not AMB longer recreational distances >1 mile at present.   PAIN:  Are you having pain? No  PRECAUTIONS: Falls  WEIGHT BEARING RESTRICTIONS: No  FALLS: Has patient fallen in last 6 months? No  LIVING ENVIRONMENT: Lives with: lives with their family Lives in: House/apartment Stairs: at his place has 5-6 steps to enter, full flight up, full flight down.  Has following equipment at home: None  *currently living with ex-girlfriend    PATIENT GOALS: regain stamina, strength   OBJECTIVE:  Note: Objective measures were completed at evaluation unless otherwise noted.  PATIENT SURVEYS:  ABC Scale: 78.1% 30sec chair rise: 8x (2 LOB anteiror, wide based stance)  MMT: 5/5 overall,  except for hamstrings 4+/5 bilat, Rt hip flexion 4+/5  Sensory screening equal bilat, light touch sensation, later steps on his big toe without awareness of this Dysmetria bilat: Left > Right (finger to nose to author's finger)  Very mild dysdiadochokinesia Wide based ataxia gait pattern SLS < 2sec bilat over 4 attempts bilat  Tandem stance 2sec bilat  Narrow stance eyes open: >30sec; eyes closed>30sec (similar performance) Narrow stance eyes closed on foam surface: <5sec, increased trunk ataxia Normalish stance eyes closed on incline: >30sec, mild increase sway, similar to firm surface, flat                                                                                                                              TREATMENT DATE 10/25/23 :  30sec chair rise: 8x (2 LOB anterior)  ABC Scale: 78.1% MMT: 5/5 overall, except for hamstrings 4+/5 bilat, Rt hip flexion 4+/5  SLS < 2sec bilat over 4 attempts bilat  Tandem stance 2sec bilat  Narrow stance eyes open: >30sec; eyes closed:  Narrow stance eyes closed on foam surface: <5sec, increased trunk ataxia Normalish stance eyes closed on incline: >30sec, mild increase sway, similar to firm surface, flat   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 Education comprehension: Fair  HOME EXERCISE PROGRAM: Access Code: [email or web address] URL: [email or web address] Date: 10/25/2023 Prepared by: Peggye Linear  Exercises - Sit to Stand  with Counter Support  - 3 x daily - 1  sets - 10 reps - Corner Balance Feet Together With Eyes Open  - 3 x daily - 1 sets - 20-25 reps - sec hold  GOALS: Goals reviewed with patient? Yes  SHORT TERM GOALS: Target date: 11/25/23  30sec chair rise >10 from standard chair without LOB Baseline: 8x c 2 LOB Goal status: INITIAL  2.  >837ft without LOB, with LRAD to facilitate safe limited community distance AMB for IADL. Baseline:  Goal status: INITIAL  3.  Pt to demonstrate narrow stance eyes closed balance on foam >15sec to demonstrate improvement in trunk ataxia and reduce falls risk. Baseline: <5sec  Goal status: INITIAL  4.  Pt to report regular walking program 5-7 days per week.  Baseline: No formal exercise.  Goal status: INITIAL  5. Pt to demonstrate modI performance of 24 stairs up and 24 stairs down without LOB to decrease likelihood of adverse effect on stairs in home for bedroom access and laundry processing.  Baseline:   Goal status: INITIAL  LONG TERM GOALS: Target date: 12/06/23  30sec chair rise >14x from standard chair without LOB Baseline: 8x c 2 LOB Goal status: INITIAL  2.  >151ft without LOB, with LRAD to facilitate safe community distance AMB for IADL, leisure, and wellness. Baseline:  Goal status: INITIAL  3.  Pt to demonstrate narrow stance eyes closed balance on foam >30sec to demonstrate improvement in trunk ataxia and reduce falls risk. Baseline: <5sec  Goal status: INITIAL  4.  Pt to report regular walking program 5-7 days per week, regular resistance training program 2-3x weekly, and balance training regimen 3-5x weekly for continued progress toward baseline mobility and improved self efficacy in management of mental health.  Baseline: No formal exercise.  Goal status: INITIAL  5. Pt to improve score on ABC scale >15% to indication improved confidence in balance over a variety of different activity.   Baseline: 78%  Goal Status: INITIAL ASSESSMENT:  CLINICAL  IMPRESSION: 53yoM s/p chronic ETOH abuse >20y now in remission x4 months, referred to OPPT for persistent imbalance and weakness following multiple sequential hospital admissions. Examination revealing of wide based gait ataxia along with several other hallmarks of cerebellar disease- explained these functional deficits and their connection to chronic heavy ETOH use. Pt very motivated to regain lost function and mobility, has good potential for improvement. Education time spent on HEP outline and vital role of performance of high volume practice outside of PT sessions. Patient will benefit from skilled physical therapy intervention to reduce deficits and impairments identified in evaluation, in order to reduce pain, improve quality of life, and maximize activity tolerance for ADL, IADL, and leisure/fitness. Physical therapy will help pt achieve long and short term goals of care.   OBJECTIVE IMPAIRMENTS: Decreased knowledge of condition, decreased use of DME, decreased mobility, difficulty walking, decreased strength, decreased ROM. ACTIVITY LIMITATIONS: Lifting, standing, walking, squatting, transfers, locomotion level PARTICIPATION LIMITATIONS: Cleaning, laundry, interpersonal relationships, driving, yardwork, community activity.  PERSONAL FACTORS: Age, behavior pattern, education, past/current experiences, transportation, profession  are also affecting patient's functional outcome.  REHAB POTENTIAL: Good CLINICAL DECISION MAKING: Medium  EVALUATION COMPLEXITY: Moderate    PLAN:  PT FREQUENCY: 1-2x/week  PT DURATION: 6 weeks  PLANNED INTERVENTIONS: 97110-Therapeutic exercises, 97530- Therapeutic activity, [id number]- Neuromuscular re-education, 97535- Self Care, [id number]- Manual therapy, [phone]- Gait training, [phone]- Electrical stimulation (unattended), Patient/Family education, Balance training, Stair training, Vestibular training, Visual/preceptual remediation/compensation, Cryotherapy, and Moist  heat  PLAN FOR NEXT SESSION:   Repeat HEP and provide expansion; , sagittal plane hip strength  10:06 PM, 10/25/23 Peggye JAYSON Linear, PT, DPT Physical Therapist - [facility]  Outpatient Physical Therapy- Main Campus [phone]     [place] C, PT 10/25/2023, 1:55 PM

## 2023-10-30 ENCOUNTER — Ambulatory Visit: Payer: MEDICAID | Admitting: Physical Therapy

## 2023-10-30 ENCOUNTER — Telehealth (HOSPITAL_COMMUNITY): Payer: Self-pay | Admitting: Licensed Clinical Social Worker

## 2023-10-30 ENCOUNTER — Ambulatory Visit: Payer: MEDICAID

## 2023-10-30 NOTE — Telephone Encounter (Signed)
 The therapist returns [place]' call leaving another, HIPAA-compliant voicemail with his direct contact number.  Zell Maier, MA, LCSW, [facility], LCAS 10/30/2023

## 2023-11-01 ENCOUNTER — Ambulatory Visit: Payer: MEDICAID | Admitting: Dermatology

## 2023-11-01 ENCOUNTER — Ambulatory Visit: Payer: MEDICAID | Admitting: Physical Therapy

## 2023-11-01 ENCOUNTER — Encounter: Payer: Self-pay | Admitting: Dermatology

## 2023-11-01 DIAGNOSIS — Z79899 Other long term (current) drug therapy: Secondary | ICD-10-CM

## 2023-11-01 DIAGNOSIS — Z7189 Other specified counseling: Secondary | ICD-10-CM

## 2023-11-01 DIAGNOSIS — L409 Psoriasis, unspecified: Secondary | ICD-10-CM | POA: Diagnosis not present

## 2023-11-01 MED ORDER — TRIAMCINOLONE ACETONIDE 0.1 % EX CREA
TOPICAL_CREAM | CUTANEOUS | 6 refills | Status: AC
Start: 2023-11-01 — End: ?

## 2023-11-01 NOTE — Progress Notes (Signed)
 Follow-Up Visit   Subjective  Lawrence Rowe is a 53 y.o. male who presents for the following: Psoriasis 36m f/u, legs feet, elbows, buttocks, Skyrizi  last injection 09/19/23, TMC 0.1% cr prn, improved, pt will be switching to Cosentyx due to insurance coverage, pt had a call from [facility] about delivery  Patient accompanied by POA, Lawrence Rowe who contributes to history.   The following portions of the chart were reviewed this encounter and updated as appropriate: medications, allergies, medical history  Review of Systems:  No other skin or systemic complaints except as noted in HPI or Assessment and Plan.  Objective  Well appearing patient in no apparent distress; mood and affect are within normal limits.   A focused examination was performed of the following areas: Legs, feet, elbows, buttocks  Relevant exam findings are noted in the Assessment and Plan.  Legs, feet           Assessment & Plan   PSORIASIS on Systemic Treatment Improved compared to photos from 08/02/23 since starting Skyrizi  08/22/23 Patient will be changing to Cosentyx in December due to insurance not covering Skyrizi  Legs, feet, elbows, buttocks Exam: postinflammatory erythema legs, feet 0% BSA on Skyrizi   Chronic and persistent condition with duration or expected duration over one year. Condition is improving with treatment but not currently at goal.   patient denies joint pain  Labs due 07/2024  Psoriasis - severe on systemic treatment.  Psoriasis is a chronic non-curable, but treatable genetic/hereditary disease that may have other systemic features affecting other organ systems such as joints (Psoriatic Arthritis).  It is linked with heart disease, inflammatory bowel disease, non-alcoholic fatty liver disease, and depression. Significant skin psoriasis and/or psoriatic arthritis may have significant symptoms and affects activities of daily activity and often benefits from systemic treatments.   These systemic treatments have some potential side effects including immunosuppression and require pre-treatment laboratory screening and periodic laboratory monitoring and periodic in person evaluation and monitoring by the attending dermatologist physician (long term medication management).    Treatment Plan: D/C Skyrizi  due to insurance not covering In December patient will start Cosentyx sq injections loading dose 300 mg (1 injection) a week for 5 weeks, then 1 injection q month Cont TMC 0.1% cr qd/bid aa body prn flares, avoid f/g/a  Topical steroids (such as triamcinolone , fluocinolone, fluocinonide, mometasone, clobetasol, halobetasol, betamethasone, hydrocortisone ) can cause thinning and lightening of the skin if they are used for too long in the same area. Your physician has selected the right strength medicine for your problem and area affected on the body. Please use your medication only as directed by your physician to prevent side effects.    Reviewed risks of biologics including immunosuppression, infections (i.e. TB reactivation), injection site reaction, and failure to improve condition. Goal is control of skin condition, not cure.  Some older biologics such as Humira and Enbrel may slightly increase risk of malignancy and may worsen congestive heart failure.  Taltz, Cosentyx, and Bimzelx may cause inflammatory bowel disease to flare or may increase incidence of yeast infections. Skyrizi , Tremfya, and Stelara may also slightly increase risk of infection. The use of biologics requires long term medication management, including periodic office visits, annual TB screening test and monitoring of blood work.  Monitoring for Tuberculosis Infection:  Patient is currently receiving biologic therapy for psoriasis, which carries a potential risk for reactivation of tuberculosis infection. As part of ongoing safety monitoring, a QuantiFERON-TB Gold (QFT-G) test is checked prior to initiation of  biologic therapy and yearly per guidelines. - Most recent QFT-G result:     Latest Ref Rng & Units 08/07/2023    3:35 PM  Quantiferon TB Gold  Quantiferon TB Gold Plus Negative Negative     - Will recheck yearly  - No signs or symptoms of active TB noted. - Will continue to monitor per standard biologic safety protocol. - If QFT-G becomes positive or patient develops symptoms suggestive of TB, appropriate evaluation will be initiated  Long term medication management.  Patient is using long term (months to years) prescription medication  to control their dermatologic condition.  These medications require periodic monitoring to evaluate for efficacy and side effects and may require periodic laboratory monitoring.    SEBORRHEIC KERATOSIS - Stuck-on, waxy, tan-brown papules and/or plaques  - Benign-appearing - Discussed benign etiology and prognosis. - Observe - Call for any changes     Return in about 3 months (around 02/01/2024) for Psoriasis f/u.  I, Grayce Saunas, RMA, am acting as scribe for Boneta Sharps, MD .   Documentation: I have reviewed the above documentation for accuracy and completeness, and I agree with the above.  Boneta Sharps, MD

## 2023-11-01 NOTE — Patient Instructions (Signed)

## 2023-11-06 ENCOUNTER — Ambulatory Visit: Payer: MEDICAID

## 2023-11-06 ENCOUNTER — Ambulatory Visit: Payer: MEDICAID | Admitting: Physical Therapy

## 2023-11-06 ENCOUNTER — Telehealth (HOSPITAL_COMMUNITY): Payer: Self-pay | Admitting: Licensed Clinical Social Worker

## 2023-11-06 DIAGNOSIS — M6281 Muscle weakness (generalized): Secondary | ICD-10-CM | POA: Diagnosis not present

## 2023-11-06 DIAGNOSIS — R262 Difficulty in walking, not elsewhere classified: Secondary | ICD-10-CM

## 2023-11-06 NOTE — Therapy (Signed)
 OUTPATIENT PHYSICAL THERAPY TREATMENT  Patient Name: Lawrence Rowe MRN: [id number] DOB:1970/07/07, 53 y.o., male Today's Date: 11/06/2023  PCP: Freddrick, No REFERRING PROVIDER: Carin Gauze, NP  END OF SESSION:  PT End of Session - 11/06/23 1615     Visit Number 2    Number of Visits 12    Date for Recertification  12/06/23    Authorization Type [facility] Health Tailored Plan    Authorization Time Period 10/25/23-12/06/23    Progress Note Due on Visit 10    PT Start Time 1616    PT Stop Time 1704    PT Time Calculation (min) 48 min    Equipment Utilized During Treatment Gait belt    Activity Tolerance Patient tolerated treatment well;No increased pain    Behavior During Therapy WFL for tasks assessed/performed           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)    GERD (gastroesophageal reflux disease)    Hypertension    Past Surgical History:  Procedure Laterality Date   ELBOW SURGERY Left    Patient Active Problem List   Diagnosis Date Noted   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   Anxiety disorder 10/23/2023   Encounter to establish care 09/24/2023   Alcohol  withdrawal delirium, acute, hypoactive (HCC) 07/20/2023   Elevated LFTs 12/22/2022   Serum total bilirubin elevated 12/22/2022   GERD (gastroesophageal reflux disease) 12/22/2022   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   Lactic acidosis 06/14/2022   Alcohol  withdrawal with inpatient treatment (HCC) 06/08/2022   High anion gap metabolic acidosis 06/08/2022   Overweight (BMI 25.0-29.9) 01/12/2022   Fatty liver 01/12/2022   Abnormal liver function 01/11/2022   Wernicke encephalopathy syndrome 12/27/2021   Delirium tremens (HCC) 12/27/2021   Alcoholic hepatitis (HCC) 12/27/2021   Alcohol  withdrawal (HCC) 12/23/2021   Transaminitis 12/23/2021   Unilateral edema of lower extremity 12/23/2021    Dermatitis 05/10/2021   Primary hypertension 05/10/2021   Tachycardia with hypertension 04/19/2021   PAD (peripheral artery disease) 04/19/2021   Alcohol  use disorder, severe, in early remission (HCC) 04/19/2021   Metabolic syndrome 04/19/2021   ONSET DATE: chronic progressive REFERRING DIAG: Weakness in legs   THERAPY DIAG:  Muscle weakness (generalized)  Difficulty in walking, not elsewhere classified  Rationale for Evaluation and Treatment: Rehabilitation  SUBJECTIVE:  SUBJECTIVE STATEMENT:  Pt states he has been doing alright. States he has been trying to do his exercises every day. States he progressed to closing his eyes during narrow BOS and it is getting better. Pt confirms he has been setting up home environment safely as recommended by last PT via use of corner and chair. Pt states his knees are bothering him when navigating stairs and feels it may be related to inactivity. Pt states he is having some swelling in his feet and his cardiologist believes it may be related to his heart and he was prescribed Lasix.   From Initial Eval: Pt has noticed some changes in leg strength/balance that have not improved since his most recent line of hospitalizations.   PERTINENT HISTORY:   53yoM referred to OPPT by cardiology at recent visit while being established. Pt is s/p multiple recent admissions for ETOH abuse/withdrawal, reports persistent weakness in legs and imbalance since getting sober in July. Pt reports no prior difficulty with these issues. Pt is fully independent at present, uses no device for mobility, no falls history, but is concerned about confidence with stairs performance and feels he could not AMB longer recreational distances >1 mile at present.   PAIN:  Are you having pain?  No  PRECAUTIONS: Falls  WEIGHT BEARING RESTRICTIONS: No  FALLS: Has patient fallen in last 6 months? No  LIVING ENVIRONMENT: Lives with: lives with their family Lives in: House/apartment Stairs: at his place has 5-6 steps to enter, full flight up, full flight down.  Has following equipment at home: None  *currently living with ex-girlfriend    PATIENT GOALS: regain stamina, strength   OBJECTIVE:  Note: Objective measures were completed at evaluation unless otherwise noted.  PATIENT SURVEYS:  ABC Scale: 78.1% 30sec chair rise: 8x (2 LOB anteiror, wide based stance)  MMT: 5/5 overall, except for hamstrings 4+/5 bilat, Rt hip flexion 4+/5  Sensory screening equal bilat, light touch sensation, later steps on his big toe without awareness of this Dysmetria bilat: Left > Right (finger to nose to author's finger)  Very mild dysdiadochokinesia Wide based ataxia gait pattern SLS < 2sec bilat over 4 attempts bilat  Tandem stance 2sec bilat  Narrow stance eyes open: >30sec; eyes closed>30sec (similar performance) Narrow stance eyes closed on foam surface: <5sec, increased trunk ataxia Normalish stance eyes closed on incline: >30sec, mild increase sway, similar to firm surface, flat                                                                                                                              TREATMENT DATE 11/06/23 :   Unless otherwise stated, SBA was provided and gait belt donned in order to ensure pt safety throughout session.  6 Min Walk Test:  Instructed patient to ambulate as quickly and as safely as possible for 6 minutes using LRAD. Patient was allowed to take standing rest breaks without stopping the test, but if the patient  required a sitting rest break the clock would be stopped and the test would be over.  Results: 1091 feet (332.5 meters, Avg speed 0.17m/s) no AD with supervision. Results indicate that the patient has reduced endurance with ambulation compared to  age matched norms.  Pt continues to demo wide based gait with slight excessive R LE internal rotation and min decreased coordination of foot landing bilaterally at initial contact Age Matched Norms: 69-69 yo M: 48 F: 24, 21-79 yo M: 61 F: 471, 31-89 yo M: 417 F: 392 MDC: 58.21 meters (190.98 feet) or 50 meters (ANPTA Core Set of Outcome Measures for Adults with Neurologic Conditions, 2018)  HR 76bpm in sitting, immediately following   Reviewed and updated patient's HEP as appropriate and as described below:  NBOS (narrow BOS) eyes open 30sec  No instability noted 1/2 tandem stance, eyes open, 30sec x1 each side Slight ankle instability, but able to maintain balance without UE support and only distant supervision 1/2 tandem stance, eyes open, x10 horizontal head turns 1xset per LE Slight postural sway with this, but able to use ankle strategy to maintain balance with close SBA for safety NBOS eyes closed x30 sec Slight instability again with L anterior lean bias Sit to stands from green chair, no UE support Pt reports experiencing some tightness in his knees when doing this exercise at home 2 x10reps Cued pt to ensure equal wt bearing through LEs due to slight R wt shift bias noted Pt reports no knee discomfort doing exercise in therapy gym chair today - educated him on placing dense, folded blanket or pillow in his seat at home to elevated seat height to decrease knee discomfort (pt states doing it from couch at home) Pt denies feeling significant muscle activation with this exercise Pt states he doesn't have access to indoor steps with a railing at this time to perform an exercise on stairs, because he is staying with his friend, Lauraine Mini-lunges with unilateral UE support 2x5reps per LE Pt states he has a lot of clutter in the kitchen currently, which may limit his ability to complete this activity at home, but he wants to try because he did feel his quad muscles activating in  the back LE during the exercise Cuing throughout to maintain trunk upright and sink straight down rather than just lunging forward     PATIENT EDUCATION: Education details: see above Person educated: Patient Education method: collaborative learning, deliberate practice, positive reinforcement, explicit instruction, establish rules. Education comprehension: Fair  HOME EXERCISE PROGRAM:  Access Code: [email or web address] URL: [email or web address] Date: 11/06/2023 Prepared by: Connell Kiss  Exercises - Sit to Stand with Counter Support  - 3 x daily - 1 sets - 10 reps - Standing Near Stance in [place] with Eyes Closed  - 1 x daily - 7 x weekly - 3 sets - 30 seconds hold - Standing Romberg to 1/2 Tandem Stance  - 1 x daily - 7 x weekly - 2 sets - 10 reps - Lunge with Counter Support  - 1 x daily - 7 x weekly - 2 sets - 5 reps    GOALS: Goals reviewed with patient? Yes  SHORT TERM GOALS: Target date: 11/25/23  30sec chair rise >10 from standard chair without LOB Baseline: 8x c 2 LOB Goal status: INITIAL  2.  >852ft without LOB, with LRAD to facilitate safe limited community distance AMB for IADL. Baseline:  Goal status: INITIAL  3.  Pt to demonstrate narrow stance eyes closed  balance on foam >15sec to demonstrate improvement in trunk ataxia and reduce falls risk. Baseline: <5sec  Goal status: INITIAL  4.  Pt to report regular walking program 5-7 days per week.  Baseline: No formal exercise.  Goal status: INITIAL  5. Pt to demonstrate modI performance of 24 stairs up and 24 stairs down without LOB to decrease likelihood of adverse effect on stairs in home for bedroom access and laundry processing.  Baseline:   Goal status: INITIAL  LONG TERM GOALS: Target date: 12/06/23  30sec chair rise >14x from standard chair without LOB Baseline: 8x c 2 LOB Goal status: INITIAL  2.  >1548ft without LOB, with LRAD to facilitate safe community distance AMB for IADL,  leisure, and wellness. Baseline:  10/28: 1091 feet (332.5 meters, Avg speed 0.99m/s), no AD with supervision Goal status: INITIAL  3.  Pt to demonstrate narrow stance eyes closed balance on foam >30sec to demonstrate improvement in trunk ataxia and reduce falls risk. Baseline: <5sec  Goal status: INITIAL  4.  Pt to report regular walking program 5-7 days per week, regular resistance training program 2-3x weekly, and balance training regimen 3-5x weekly for continued progress toward baseline mobility and improved self efficacy in management of mental health.  Baseline: No formal exercise.  Goal status: INITIAL  5. Pt to improve score on ABC scale >15% to indication improved confidence in balance over a variety of different activity.   Baseline: 78%  Goal Status: INITIAL ASSESSMENT:  CLINICAL IMPRESSION:  53yoM s/p chronic ETOH abuse >20y now in remission x4 months, referred to OPPT for persistent imbalance and weakness following multiple sequential hospital admissions. Patient reports excellent compliance with his HEP since initial eval with improvements noted in his static standing balance. Patient participated in walk test and demonstrates decreased gait endurance and decreased gait speed over time. Remainder of therapy session focused on review and progression of patient's HEP. Patient demonstrates ability to maintain NBOS with eyes open; therefore, progressed to eyes closed to increase upregulation of somatosensory and vestibular systems. Patient also able to progress to 1/2 tandem stance with horizontal head turns, which required active use of ankle strategy to maintain balance. Patient continues to also benefit from B LE functional strengthening via STSs and mini-lunges to improve his strength for stair navigation, requiring cuing to maintain proper form for increased quad activation. Patient will benefit from skilled physical therapy intervention to reduce deficits and impairments  identified in evaluation, in order to reduce pain, improve quality of life, and maximize activity tolerance for ADL, IADL, and leisure/fitness. Physical therapy will help pt achieve long and short term goals of care.    OBJECTIVE IMPAIRMENTS: Decreased knowledge of condition, decreased use of DME, decreased mobility, difficulty walking, decreased strength, decreased ROM. ACTIVITY LIMITATIONS: Lifting, standing, walking, squatting, transfers, locomotion level PARTICIPATION LIMITATIONS: Cleaning, laundry, interpersonal relationships, driving, yardwork, community activity.  PERSONAL FACTORS: Age, behavior pattern, education, past/current experiences, transportation, profession  are also affecting patient's functional outcome.  REHAB POTENTIAL: Good CLINICAL DECISION MAKING: Medium  EVALUATION COMPLEXITY: Moderate    PLAN:  PT FREQUENCY: 1-2x/week  PT DURATION: 6 weeks  PLANNED INTERVENTIONS: 97110-Therapeutic exercises, 97530- Therapeutic activity, [id number]- Neuromuscular re-education, 97535- Self Care, [id number]- Manual therapy, [phone]- Gait training, [phone]- Electrical stimulation (unattended), Patient/Family education, Balance training, Stair training, Vestibular training, Visual/preceptual remediation/compensation, Cryotherapy, and Moist heat  PLAN FOR NEXT SESSION:  Follow-up on expanded HEP Sagittal plane hip strength B LE strengthening exercises to decrease knee pain with stair navigation  Progress balance   Khamarion Bjelland, PT, DPT, NCS, CSRS Physical Therapist - [place]  [facility]  5:06 PM 11/06/23

## 2023-11-06 NOTE — Telephone Encounter (Signed)
 The therapist returns Lawrence Rowe's call but he says that he is unable to talk at this time as he has some work being done at his house.  He says that he will give this therapist to call back tomorrow in the afternoon.  Zell Maier, MA, LCSW, [facility], LCAS 11/06/2023

## 2023-11-07 ENCOUNTER — Other Ambulatory Visit: Payer: Self-pay | Admitting: Cardiovascular Disease

## 2023-11-07 DIAGNOSIS — I10 Essential (primary) hypertension: Secondary | ICD-10-CM

## 2023-11-08 NOTE — Telephone Encounter (Signed)
 Will we be managing this. Ok to fill

## 2023-11-09 ENCOUNTER — Ambulatory Visit: Payer: MEDICAID | Admitting: Physical Therapy

## 2023-11-09 MED ORDER — METOPROLOL SUCCINATE ER 50 MG PO TB24: 50.0000 mg | ORAL_TABLET | Freq: Every day | ORAL | 3 refills | Status: DC

## 2023-11-13 ENCOUNTER — Ambulatory Visit: Payer: MEDICAID

## 2023-11-13 ENCOUNTER — Telehealth (HOSPITAL_COMMUNITY): Payer: Self-pay | Admitting: Licensed Clinical Social Worker

## 2023-11-13 ENCOUNTER — Encounter (HOSPITAL_COMMUNITY): Payer: Self-pay | Admitting: Licensed Clinical Social Worker

## 2023-11-13 NOTE — Telephone Encounter (Signed)
 The therapist returns Chris's call leaving another HIPAA compliant voicemail.  Zell Maier, MA, LCSW, [facility], LCAS 11/13/2023

## 2023-11-15 ENCOUNTER — Ambulatory Visit: Payer: MEDICAID

## 2023-11-20 ENCOUNTER — Encounter: Payer: Self-pay | Admitting: Psychiatry

## 2023-11-20 ENCOUNTER — Ambulatory Visit: Payer: MEDICAID | Admitting: Physical Therapy

## 2023-11-20 ENCOUNTER — Ambulatory Visit: Payer: MEDICAID

## 2023-11-20 ENCOUNTER — Telehealth (INDEPENDENT_AMBULATORY_CARE_PROVIDER_SITE_OTHER): Payer: MEDICAID | Admitting: Psychiatry

## 2023-11-20 DIAGNOSIS — F1021 Alcohol dependence, in remission: Secondary | ICD-10-CM | POA: Diagnosis not present

## 2023-11-20 DIAGNOSIS — F1994 Other psychoactive substance use, unspecified with psychoactive substance-induced mood disorder: Secondary | ICD-10-CM

## 2023-11-20 DIAGNOSIS — F419 Anxiety disorder, unspecified: Secondary | ICD-10-CM

## 2023-11-20 NOTE — Progress Notes (Unsigned)
 Virtual Visit via Video Note  I connected with Lawrence Rowe on 11/20/23 at 11:00 AM EST by a video enabled telemedicine application and verified that I am speaking with the correct person using two identifiers.  Location Provider Location : [facility] Patient Location : Home ( Friend's)  Participants: Patient , Provider    I discussed the limitations of evaluation and management by telemedicine and the availability of in person appointments. The patient expressed understanding and agreed to proceed.   I discussed the assessment and treatment plan with the patient. The patient was provided an opportunity to ask questions and all were answered. The patient agreed with the plan and demonstrated an understanding of the instructions.   The patient was advised to call back or seek an in-person evaluation if the symptoms worsen or if the condition fails to improve as anticipated.  BH MD OP Progress Note  11/20/2023 11:08 AM Lawrence Rowe  MRN:  [id number]  Chief Complaint:  Chief Complaint  Patient presents with   Follow-up   Anxiety   Medication Refill   Alcohol  Problem   Discussed the use of AI scribe software for clinical note transcription with the patient, who gave verbal consent to proceed.  History of Present Illness Lawrence Rowe is a 53 year old Caucasian male, single, currently lives in [place] with a friend, unemployed, has a history of alcoholism, anxiety and depression, hypertension, chronic diastolic congestive heart failure, hepatic cirrhosis, Wernicke's syndrome, GERD was evaluated by telemedicine today for a follow-up appointment.  Since the last visit, he has taken mirtazapine 7.5 mg nightly and has not experienced any side effects. He remains uncertain about the medication's effectiveness, and he describes ongoing anxiety, which he reports relates in part to current stressors involving home construction and preparing to sell his house.  He reports his mood  remains pretty good except for situational stress. He notes an increase in appetite and is currently eating better. Regarding sleep, he states that some nights are better than others, but he did not sleep well the previous night due to ongoing stressors. He expresses concern about developing dependence on medications and inquires about non-pharmacologic options such as exercise and therapy.  He denies any thoughts of harming himself or others. He has not yet started therapy, but he has spoken briefly with the referred therapist, Lawrence Rowe.  He continues to stay away from alcohol  and reports it has been 4 months since his past use of alcohol .    Visit Diagnosis:    ICD-10-CM   1. Alcohol  use disorder, severe, in early remission (HCC)  F10.21     2. Substance or medication-induced depressive disorder (HCC)  F19.94    Alcohol     3. Anxiety disorder, unspecified type  F41.9       Past Psychiatric History: I have reviewed past psychiatric history from progress note on 10/24/2023.  Past trials of Lexapro .  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  Family History:  Family History  Problem Relation Age of Onset   Diabetes Mother    Bladder Cancer Father    Lupus Sister    Alcohol  abuse Neg  Hx    Mental illness Neg Hx     Social History: I have reviewed social history from progress note on 10/24/2023. Social History   Socioeconomic History   Marital status: Single    Spouse name: Not on file   Number of children: Not on file   Years of education: Not on file   Highest education level: Bachelor's degree (e.g., BA, AB,  BS)  Occupational History   Not on file  Tobacco Use   Smoking status: Former    Current packs/day: 0.00    Types: Cigarettes    Quit date: 2020    Years since quitting: 5.8   Smokeless tobacco: Never  Vaping Use   Vaping status: Never Used  Substance and Sexual Activity   Alcohol  use: Not Currently    Alcohol /week: 12.0 standard drinks of alcohol     Types: 12 Cans of beer per week    Comment: daily 12 pk-- Patient has recently quit drinking; 07/11/2023.   Drug use: Not Currently    Types: Marijuana   Sexual activity: Not Currently  Other Topics Concern   Not on file  Social History Narrative   Not on file   Social Drivers of Health   Financial Resource Strain: Not on file  Food Insecurity: No Food Insecurity (07/21/2023)   Hunger Vital Sign    Worried About Running Out of Food in the Last Year: Never true    Ran Out of Food in the Last Year: Never true  Transportation Needs: No Transportation Needs (07/21/2023)   PRAPARE - Administrator, Civil Service (Medical): No    Lack of Transportation (Non-Medical): No  Physical Activity: Not on file  Stress: Not on file  Social Connections: Not on file    Allergies:  Allergies  Allergen Reactions   Penicillins Other (See Comments)    Metabolic Disorder Labs: Lab Results  Component Value Date   HGBA1C 5.4 09/26/2023   No results found for: PROLACTIN Lab Results  Component Value Date   CHOL 138 09/26/2023   TRIG 66 09/26/2023   HDL 54 09/26/2023   CHOLHDL 2.6 09/26/2023   LDLCALC 70 09/26/2023   LDLCALC 115 (H) 05/06/2021   Lab Results  Component Value Date   TSH 1.720 09/26/2023   TSH 3.249 01/12/2022    Therapeutic Level Labs: No results found for: LITHIUM No results found for: VALPROATE No results found for: CBMZ  Current Medications: Current Outpatient Medications  Medication Sig Dispense Refill   escitalopram  (LEXAPRO ) 10 MG tablet Take 1 tablet (10 mg total) by mouth daily. 30  tablet 3   folic acid  (FOLVITE ) 1 MG tablet Take 1 tablet (1 mg total) by mouth daily. 90 tablet 1   furosemide (LASIX) 20 MG tablet Take 1 tablet (20 mg total) by mouth daily as needed. 30 tablet 1   hydrOXYzine  (ATARAX ) 10 MG tablet Take 1 tablet (10 mg total) by mouth 3 (three) times daily as needed. 90 tablet 1   metoprolol  succinate (TOPROL -XL) 50 MG 24 hr tablet Take 1 tablet (50 mg total) by mouth daily. Take with or immediately following a meal. 90 tablet 3   mirtazapine (REMERON) 7.5 MG tablet Take 1 tablet (7.5 mg total) by mouth at bedtime. 30 tablet 1   Multiple Vitamin (MULTIVITAMIN WITH MINERALS) TABS tablet Take 1 tablet by mouth daily. 30 tablet 0   [START ON 12/12/2023] Secukinumab (COSENTYX UNOREADY) 300 MG/2ML SOAJ Inject 300 mg into the skin once a week.  10 mL 0   [START ON 01/16/2024] Secukinumab (COSENTYX UNOREADY) 300 MG/2ML SOAJ Inject 300 mg into the skin every 28 (twenty-eight) days. 2 mL 10   thiamine  (VITAMIN B-1) 100 MG tablet Take 1 tablet (100 mg total) by mouth daily. 90 tablet 1   triamcinolone  cream (KENALOG ) 0.1 % Apply twice daily to affected areas until improved, for psoriasis. Avoid applying to face, groin, and axilla. 454 g 6   No current facility-administered medications for this visit.     Musculoskeletal: Strength & Muscle Tone: UTA Gait & Station: Seated Patient leans: N/A  Psychiatric Specialty Exam: Review of Systems  Psychiatric/Behavioral:  Positive for dysphoric mood and sleep disturbance. The patient is nervous/anxious.     There were no vitals taken for this visit.There is no height or weight on file to calculate BMI.  General Appearance: Casual  Eye Contact:  Fair  Speech:  Clear and Coherent  Volume:  Normal  Mood:  Anxious and Depressed  Affect:  Congruent  Thought Process:  Goal Directed and Descriptions of Associations: Intact  Orientation:  Full (Time, Place, and Person)  Thought Content: Logical   Suicidal Thoughts:  No   Homicidal Thoughts:  No  Memory:  Immediate;   Fair Recent;   Fair Remote;   Fair  Judgement:  Fair  Insight:  Fair  Psychomotor Activity:  Normal  Concentration:  Concentration: Fair and Attention Span: Fair  Recall:  [employer] of Knowledge: Fair  Language: Fair  Akathisia:  No  Handed:  Right  AIMS (if indicated): not done  Assets:  Communication Skills Desire for Improvement Housing Social Support Transportation  ADL's:  Intact  Cognition: WNL  Sleep:  some improvement   Screenings: GAD-7    Flowsheet Row Office Visit from 10/24/2023 in [facility] Office Visit from 09/24/2023 in [facility]  Total GAD-7 Score 6 16   PHQ2-9    Flowsheet Row Office Visit from 10/24/2023 in [facility] Office Visit from 09/24/2023 in [facility] Office Visit from 04/19/2021 in [facility]  PHQ-2 Total Score 2 6 0  PHQ-9 Total Score 3 21 --   Flowsheet Row Video Visit from 11/20/2023 in [facility] Office Visit from 10/24/2023 in [facility] to Hosp-Admission (Discharged) from 07/20/2023 in [facility]  C-SSRS RISK CATEGORY No Risk No Risk No Risk     Assessment and Plan: Lawrence Rowe is a 53 year old Caucasian male who presented for a follow-up appointment, discussed assessment and plan as noted below.  1. Alcohol  use disorder, severe, in early remission [facility]) Currently reports he continues to stay away from alcohol  use. Recommended participating in [employer] Referred to Lawrence Rowe, [place] for substance abuse counseling-pending.  Patient encouraged to get in touch.  2. 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   Not interested in dosage increase of mirtazapine and would like to pursue psychotherapy. Continue Remeron/Mirtazapine 7.5 mg at bedtime Continue Lexapro  10 mg daily   3. Anxiety disorder, unspecified type-unstable Ongoing situational stressors leading to anxiety. Declines medication readjustment. Encouraged to establish care with therapist. Continue Hydroxyzine  10 mg 3 times a day as needed for anxiety  Follow-up Follow-up in clinic in 1 month or sooner if needed.    Collaboration of Care: Collaboration of Care: Referral or  follow-up with counselor/therapist AEB patient encouraged to establish care with therapist, referred to Lawrence [facility]e in [facility].  Patient/Guardian was advised Release of Information must be obtained prior to any record release in order to collaborate their care with an outside provider. Patient/Guardian was advised if they have not already done so to contact the registration department to sign all necessary forms in order for us  to release information regarding their care.   Consent: Patient/Guardian gives verbal consent for treatment and assignment of benefits for services provided during this visit. Patient/Guardian expressed understanding and agreed to proceed.  This note was generated in part or whole with voice recognition software. Voice recognition is usually quite accurate but there are transcription errors that can and very often do occur. I apologize for any typographical errors that were not detected and corrected.  This note was generated in part or whole with voice recognition software. Voice recognition is usually quite accurate but there are transcription errors that can and very often do occur. I apologize for any typographical errors that were not detected and corrected.      Lindsee Labarre, MD 11/21/2023, 7:43 AM

## 2023-11-20 NOTE — Therapy (Incomplete)
 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)    GERD (gastroesophageal reflux disease)    Hypertension    Past Surgical History:  Procedure Laterality Date   ELBOW SURGERY Left    Patient Active Problem List   Diagnosis Date Noted   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   Anxiety disorder 10/23/2023   Encounter to establish care 09/24/2023   Alcohol  withdrawal delirium, acute, hypoactive (HCC) 07/20/2023   Elevated LFTs 12/22/2022   Serum total bilirubin elevated 12/22/2022   GERD (gastroesophageal reflux disease) 12/22/2022   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   Lactic acidosis 06/14/2022   Alcohol  withdrawal with inpatient treatment (HCC) 06/08/2022   High anion gap metabolic acidosis 06/08/2022   Overweight (BMI 25.0-29.9) 01/12/2022   Fatty liver 01/12/2022   Abnormal liver function 01/11/2022   Wernicke encephalopathy syndrome 12/27/2021   Delirium tremens (HCC) 12/27/2021   Alcoholic hepatitis (HCC) 12/27/2021   Alcohol  withdrawal (HCC) 12/23/2021   Transaminitis 12/23/2021   Unilateral edema of lower extremity 12/23/2021   Dermatitis 05/10/2021   Primary hypertension 05/10/2021   Tachycardia with hypertension 04/19/2021   PAD (peripheral artery disease) 04/19/2021   Alcohol  use disorder, severe, in early remission (HCC) 04/19/2021   Metabolic syndrome 04/19/2021   ONSET DATE: chronic progressive REFERRING DIAG: Weakness in legs   THERAPY DIAG:  No diagnosis found.  Rationale for Evaluation and Treatment: Rehabilitation  SUBJECTIVE:                                                                                                                                                                                              SUBJECTIVE STATEMENT:  Pt states he has been doing alright. States he has been trying to do his exercises every day. States he progressed to closing his eyes during narrow BOS and it is getting better. Pt confirms he has been setting up home environment safely as recommended by last PT via use of corner and chair. Pt states his knees are bothering him when navigating stairs and feels it may be related to inactivity. Pt states he is having some swelling in his feet and his cardiologist believes it may be related to his heart and he was prescribed Lasix.   From Initial Eval: Pt has noticed some changes in leg strength/balance that have not improved since  his most recent line of hospitalizations.   PERTINENT HISTORY:   53yoM referred to OPPT by cardiology at recent visit while being established. Pt is s/p multiple recent admissions for ETOH abuse/withdrawal, reports persistent weakness in legs and imbalance since getting sober in July. Pt reports no prior difficulty with these issues. Pt is fully independent at present, uses no device for mobility, no falls history, but is concerned about confidence with stairs performance and feels he could not AMB longer recreational distances >1 mile at present.   PAIN:  Are you having pain? No  PRECAUTIONS: Falls  WEIGHT BEARING RESTRICTIONS: No  FALLS: Has patient fallen in last 6 months? No  LIVING ENVIRONMENT: Lives with: lives with their family Lives in: House/apartment Stairs: at his place has 5-6 steps to enter, full flight up, full flight down.  Has following equipment at home: None  *currently living with ex-girlfriend    PATIENT GOALS: regain stamina, strength   OBJECTIVE:  Note: Objective measures were completed at evaluation unless otherwise noted.  PATIENT SURVEYS:  ABC Scale: 78.1% 30sec chair rise: 8x (2 LOB anteiror, wide based stance)   MMT: 5/5 overall, except for hamstrings 4+/5 bilat, Rt hip flexion 4+/5  Sensory screening equal bilat, light touch sensation, later steps on his big toe without awareness of this Dysmetria bilat: Left > Right (finger to nose to author's finger)  Very mild dysdiadochokinesia Wide based ataxia gait pattern SLS < 2sec bilat over 4 attempts bilat  Tandem stance 2sec bilat  Narrow stance eyes open: >30sec; eyes closed>30sec (similar performance) Narrow stance eyes closed on foam surface: <5sec, increased trunk ataxia Normalish stance eyes closed on incline: >30sec, mild increase sway, similar to firm surface, flat                                                                                                                              TREATMENT DATE 11/20/23 :   Unless otherwise stated, SBA was provided and gait belt donned in order to ensure pt safety throughout session. Sagittal plane hip strength B LE strengthening exercises to decrease knee pain with stair navigation Progress balance     PATIENT EDUCATION: Education details: see above Person educated: Patient Education method: research scientist (medical), deliberate practice, positive reinforcement, explicit instruction, establish rules. Education comprehension: Fair  HOME EXERCISE PROGRAM:  Access Code: [email or web address] URL: [email or web address] Date: 11/06/2023 Prepared by: Connell Kiss  Exercises - Sit to Stand with Counter Support  - 3 x daily - 1 sets - 10 reps - Standing Near Stance in [place] with Eyes Closed  - 1 x daily - 7 x weekly - 3 sets - 30 seconds hold - Standing Romberg to 1/2 Tandem Stance  - 1 x daily - 7 x weekly - 2 sets - 10 reps - Lunge with Counter Support  - 1 x daily - 7 x weekly - 2 sets - 5 reps    GOALS: Goals reviewed with patient? Yes  SHORT TERM GOALS: Target date: 11/25/23  30sec chair rise >10 from standard chair without LOB Baseline: 8x c 2 LOB Goal status: INITIAL  2.   >820ft without LOB, with LRAD to facilitate safe limited community distance AMB for IADL. Baseline:  Goal status: INITIAL  3.  Pt to demonstrate narrow stance eyes closed balance on foam >15sec to demonstrate improvement in trunk ataxia and reduce falls risk. Baseline: <5sec  Goal status: INITIAL  4.  Pt to report regular walking program 5-7 days per week.  Baseline: No formal exercise.  Goal status: INITIAL  5. Pt to demonstrate modI performance of 24 stairs up and 24 stairs down without LOB to decrease likelihood of adverse effect on stairs in home for bedroom access and laundry processing.  Baseline:   Goal status: INITIAL  LONG TERM GOALS: Target date: 12/06/23  30sec chair rise >14x from standard chair without LOB Baseline: 8x c 2 LOB Goal status: INITIAL  2.  >1510ft without LOB, with LRAD to facilitate safe community distance AMB for IADL, leisure, and wellness. Baseline:  10/28: 1091 feet (332.5 meters, Avg speed 0.73m/s), no AD with supervision Goal status: INITIAL  3.  Pt to demonstrate narrow stance eyes closed balance on foam >30sec to demonstrate improvement in trunk ataxia and reduce falls risk. Baseline: <5sec  Goal status: INITIAL  4.  Pt to report regular walking program 5-7 days per week, regular resistance training program 2-3x weekly, and balance training regimen 3-5x weekly for continued progress toward baseline mobility and improved self efficacy in management of mental health.  Baseline: No formal exercise.  Goal status: INITIAL  5. Pt to improve score on ABC scale >15% to indication improved confidence in balance over a variety of different activity.   Baseline: 78%  Goal Status: INITIAL ASSESSMENT:  CLINICAL IMPRESSION:  53yoM s/p chronic ETOH abuse >20y now in remission x4 months, referred to OPPT for persistent imbalance and weakness following multiple sequential hospital admissions. Patient reports excellent compliance with his HEP since  initial eval with improvements noted in his static standing balance. Patient participated in walk test and demonstrates decreased gait endurance and decreased gait speed over time. Remainder of therapy session focused on review and progression of patient's HEP. Patient demonstrates ability to maintain NBOS with eyes open; therefore, progressed to eyes closed to increase upregulation of somatosensory and vestibular systems. Patient also able to progress to 1/2 tandem stance with horizontal head turns, which required active use of ankle strategy to maintain balance. Patient continues to also benefit from B LE functional strengthening via STSs and mini-lunges to improve his strength for stair navigation, requiring cuing to maintain proper form for increased quad activation. Patient will benefit from skilled physical therapy intervention to reduce deficits and impairments identified in evaluation, in order to reduce pain, improve quality of life, and maximize activity tolerance for ADL, IADL, and leisure/fitness. Physical therapy will help pt achieve long and short term goals of care.    OBJECTIVE IMPAIRMENTS: Decreased knowledge of condition, decreased use of DME, decreased mobility, difficulty walking, decreased strength, decreased ROM. ACTIVITY LIMITATIONS: Lifting, standing, walking, squatting, transfers, locomotion level PARTICIPATION LIMITATIONS: Cleaning, laundry, interpersonal relationships, driving, yardwork, community activity.  PERSONAL FACTORS: Age, behavior pattern, education, past/current experiences, transportation, profession  are also affecting patient's functional outcome.  REHAB POTENTIAL: Good CLINICAL DECISION MAKING: Medium  EVALUATION COMPLEXITY: Moderate    PLAN:  PT FREQUENCY: 1-2x/week  PT DURATION: 6 weeks  PLANNED INTERVENTIONS: 97110-Therapeutic exercises, 97530-  Therapeutic activity, [id number]- Neuromuscular re-education, [phone]- Self Care, [id number]- Manual therapy, [phone]-  Gait training, [phone]- Electrical stimulation (unattended), Patient/Family education, Balance training, Stair training, Vestibular training, Visual/preceptual remediation/compensation, Cryotherapy, and Moist heat  PLAN FOR NEXT SESSION:  Follow-up on expanded HEP Sagittal plane hip strength B LE strengthening exercises to decrease knee pain with stair navigation Progress balance   Note: Portions of this document were prepared using Dragon voice recognition software and although reviewed may contain unintentional dictation errors in syntax, grammar, or spelling.  Lonni KATHEE Gainer PT ,DPT Physical Therapist- [place]  [facility]   9:53 AM 11/20/23

## 2023-11-21 ENCOUNTER — Other Ambulatory Visit: Payer: Self-pay

## 2023-11-22 ENCOUNTER — Ambulatory Visit: Payer: MEDICAID | Admitting: Nurse Practitioner

## 2023-11-27 ENCOUNTER — Ambulatory Visit: Payer: MEDICAID

## 2023-11-27 ENCOUNTER — Ambulatory Visit: Payer: MEDICAID | Attending: Cardiology | Admitting: Physical Therapy

## 2023-11-27 DIAGNOSIS — M6281 Muscle weakness (generalized): Secondary | ICD-10-CM | POA: Diagnosis present

## 2023-11-27 DIAGNOSIS — R262 Difficulty in walking, not elsewhere classified: Secondary | ICD-10-CM | POA: Diagnosis present

## 2023-11-27 NOTE — Therapy (Unsigned)
 OUTPATIENT PHYSICAL THERAPY TREATMENT  Patient Name: Lawrence Rowe MRN: [id number] DOB:1970-07-20, 53 y.o., male Today's Date: 11/27/2023  PCP: Freddrick, No REFERRING PROVIDER: Carin Gauze, NP  END OF SESSION:  PT End of Session - 11/27/23 1520     Visit Number 3    Number of Visits 12    Date for Recertification  12/06/23    Authorization Type [facility] Tailored Plan    Authorization Time Period 10/25/23-12/06/23    Progress Note Due on Visit 10    PT Start Time 1531    PT Stop Time 1612    PT Time Calculation (min) 41 min    Equipment Utilized During Treatment Gait belt    Activity Tolerance Patient tolerated treatment well;No increased pain    Behavior During Therapy WFL for tasks assessed/performed            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)    GERD (gastroesophageal reflux disease)    Hypertension    Past Surgical History:  Procedure Laterality Date   ELBOW SURGERY Left    Patient Active Problem List   Diagnosis Date Noted   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   Anxiety disorder 10/23/2023   Encounter to establish care 09/24/2023   Alcohol  withdrawal delirium, acute, hypoactive (HCC) 07/20/2023   Elevated LFTs 12/22/2022   Serum total bilirubin elevated 12/22/2022   GERD (gastroesophageal reflux disease) 12/22/2022   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   Lactic acidosis 06/14/2022   Alcohol  withdrawal with inpatient treatment (HCC) 06/08/2022   High anion gap metabolic acidosis 06/08/2022   Overweight (BMI 25.0-29.9) 01/12/2022   Fatty liver 01/12/2022   Abnormal liver function 01/11/2022   Wernicke encephalopathy syndrome 12/27/2021   Delirium tremens (HCC) 12/27/2021   Alcoholic hepatitis (HCC) 12/27/2021   Alcohol  withdrawal (HCC) 12/23/2021   Transaminitis 12/23/2021   Unilateral edema of lower extremity  12/23/2021   Dermatitis 05/10/2021   Primary hypertension 05/10/2021   Tachycardia with hypertension 04/19/2021   PAD (peripheral artery disease) 04/19/2021   Alcohol  use disorder, severe, in early remission (HCC) 04/19/2021   Metabolic syndrome 04/19/2021   ONSET DATE: chronic progressive REFERRING DIAG: Weakness in legs   THERAPY DIAG:  Muscle weakness (generalized)  Difficulty in walking, not elsewhere classified  Rationale for Evaluation and Treatment: Rehabilitation  SUBJECTIVE:  SUBJECTIVE STATEMENT: Pt reports no significant changes since last visit. Feels he needs to be doing Hep more, has been about 3 x per week since last visit.   From Initial Eval: Pt has noticed some changes in leg strength/balance that have not improved since his most recent line of hospitalizations.   PERTINENT HISTORY:   53yoM referred to OPPT by cardiology at recent visit while being established. Pt is s/p multiple recent admissions for ETOH abuse/withdrawal, reports persistent weakness in legs and imbalance since getting sober in July. Pt reports no prior difficulty with these issues. Pt is fully independent at present, uses no device for mobility, no falls history, but is concerned about confidence with stairs performance and feels he could not AMB longer recreational distances >1 mile at present.   PAIN:  Are you having pain? No  PRECAUTIONS: Falls  WEIGHT BEARING RESTRICTIONS: No  FALLS: Has patient fallen in last 6 months? No  LIVING ENVIRONMENT: Lives with: lives with their family Lives in: House/apartment Stairs: at his place has 5-6 steps to enter, full flight up, full flight down.  Has following equipment at home: None  *currently living with ex-girlfriend    PATIENT GOALS: regain stamina, strength    OBJECTIVE:  Note: Objective measures were completed at evaluation unless otherwise noted.  PATIENT SURVEYS:  ABC Scale: 78.1% 30sec chair rise: 8x (2 LOB anteiror, wide based stance)  MMT: 5/5 overall, except for hamstrings 4+/5 bilat, Rt hip flexion 4+/5  Sensory screening equal bilat, light touch sensation, later steps on his big toe without awareness of this Dysmetria bilat: Left > Right (finger to nose to author's finger)  Very mild dysdiadochokinesia Wide based ataxia gait pattern SLS < 2sec bilat over 4 attempts bilat  Tandem stance 2sec bilat  Narrow stance eyes open: >30sec; eyes closed>30sec (similar performance) Narrow stance eyes closed on foam surface: <5sec, increased trunk ataxia Normalish stance eyes closed on incline: >30sec, mild increase sway, similar to firm surface, flat                                                                                                                              TREATMENT DATE 11/27/23 :  TA- To improve functional movements patterns for everyday tasks   Nustep interval training rolling hills mode level 3-7 x 8 min  Step up no UE support to step trainer 2 x 10 ea LE - careful with all activities to avoid knee pain, decreased control noted with step ups and down  NMR: To facilitate reeducation of movement, balance, posture, coordination, and/or proprioception/kinesthetic sense.  Aide step up to airex pad 2 x 10 ea LE Side step on and off airex x 10 ea LE  Step tap to 6 in step from airex 2 x 10 ea LE, cues for foot placement for optimal performance  TE- To improve strength, endurance, mobility, and function of specific targeted muscle groups or improve joint range of motion or  improve muscle flexibility  Standing terminal knee knee extension x 15 ea with GTB - cues for depth and control   PATIENT EDUCATION: Education details: see above Person educated: Patient Education method: collaborative learning, deliberate practice,  positive reinforcement, explicit instruction, establish rules. Education comprehension: Fair  HOME EXERCISE PROGRAM:  Access Code: [email or web address] URL: [email or web address] Date: 11/06/2023 Prepared by: Connell Kiss  Exercises - Sit to Stand with Counter Support  - 3 x daily - 1 sets - 10 reps - Standing Near Stance in [place] with Eyes Closed  - 1 x daily - 7 x weekly - 3 sets - 30 seconds hold - Standing Romberg to 1/2 Tandem Stance  - 1 x daily - 7 x weekly - 2 sets - 10 reps - Lunge with Counter Support  - 1 x daily - 7 x weekly - 2 sets - 5 reps    GOALS: Goals reviewed with patient? Yes  SHORT TERM GOALS: Target date: 11/25/23  30sec chair rise >10 from standard chair without LOB Baseline: 8x c 2 LOB Goal status: INITIAL  2.  >834ft without LOB, with LRAD to facilitate safe limited community distance AMB for IADL. Baseline:  Goal status: INITIAL  3.  Pt to demonstrate narrow stance eyes closed balance on foam >15sec to demonstrate improvement in trunk ataxia and reduce falls risk. Baseline: <5sec  Goal status: INITIAL  4.  Pt to report regular walking program 5-7 days per week.  Baseline: No formal exercise.  Goal status: INITIAL  5. Pt to demonstrate modI performance of 24 stairs up and 24 stairs down without LOB to decrease likelihood of adverse effect on stairs in home for bedroom access and laundry processing.  Baseline:   Goal status: INITIAL  LONG TERM GOALS: Target date: 12/06/23  30sec chair rise >14x from standard chair without LOB Baseline: 8x c 2 LOB Goal status: INITIAL  2.  >1546ft without LOB, with LRAD to facilitate safe community distance AMB for IADL, leisure, and wellness. Baseline:  10/28: 1091 feet (332.5 meters, Avg speed 0.55m/s), no AD with supervision Goal status: INITIAL  3.  Pt to demonstrate narrow stance eyes closed balance on foam >30sec to demonstrate improvement in trunk ataxia and reduce falls risk. Baseline:  <5sec  Goal status: INITIAL  4.  Pt to report regular walking program 5-7 days per week, regular resistance training program 2-3x weekly, and balance training regimen 3-5x weekly for continued progress toward baseline mobility and improved self efficacy in management of mental health.  Baseline: No formal exercise.  Goal status: INITIAL  5. Pt to improve score on ABC scale >15% to indication improved confidence in balance over a variety of different activity.   Baseline: 78%  Goal Status: INITIAL ASSESSMENT:  CLINICAL IMPRESSION:  Patient arrived with good motivation for completion of pt activities.  Pt progresses with LE strength, endurance and functional strength this date with careful consideration to avoid knee pain and discomfort. Overall tolerated well with minimal knee discomfort and adequately challenged with interventions showing fatigue and requiring rest breaks at times. Patient will benefit from skilled physical therapy intervention to reduce deficits and impairments identified in evaluation, in order to reduce pain, improve quality of life, and maximize activity tolerance for ADL, IADL, and leisure/fitness. Physical therapy will help pt achieve long and short term goals of care.    OBJECTIVE IMPAIRMENTS: Decreased knowledge of condition, decreased use of DME, decreased mobility, difficulty walking, decreased strength, decreased ROM. ACTIVITY LIMITATIONS: Lifting, standing, walking,  squatting, transfers, locomotion level PARTICIPATION LIMITATIONS: Cleaning, laundry, interpersonal relationships, driving, yardwork, community activity.  PERSONAL FACTORS: Age, behavior pattern, education, past/current experiences, transportation, profession  are also affecting patient's functional outcome.  REHAB POTENTIAL: Good CLINICAL DECISION MAKING: Medium  EVALUATION COMPLEXITY: Moderate    PLAN:  PT FREQUENCY: 1-2x/week  PT DURATION: 6 weeks  PLANNED INTERVENTIONS: 97110-Therapeutic  exercises, 97530- Therapeutic activity, 97112- Neuromuscular re-education, [phone]- Self Care, 02859- Manual therapy, [phone]- Gait training, [phone]- Electrical stimulation (unattended), Patient/Family education, Balance training, Stair training, Vestibular training, Visual/preceptual remediation/compensation, Cryotherapy, and Moist heat  PLAN FOR NEXT SESSION:  Follow-up on expanded HEP Sagittal plane hip strength B LE strengthening exercises to decrease knee pain with stair navigation Progress balance,  progress step navigation within pain limitations of knee   Note: Portions of this document were prepared using Dragon voice recognition software and although reviewed may contain unintentional dictation errors in syntax, grammar, or spelling.  Lonni KATHEE Gainer PT ,DPT Physical Therapist- [place]  [facility]   3:21 PM 11/27/23

## 2023-11-29 ENCOUNTER — Ambulatory Visit: Payer: MEDICAID

## 2023-12-04 ENCOUNTER — Ambulatory Visit: Payer: MEDICAID

## 2023-12-04 ENCOUNTER — Other Ambulatory Visit: Payer: Self-pay | Admitting: Cardiology

## 2023-12-04 ENCOUNTER — Other Ambulatory Visit: Payer: Self-pay | Admitting: Cardiovascular Disease

## 2023-12-04 DIAGNOSIS — I739 Peripheral vascular disease, unspecified: Secondary | ICD-10-CM

## 2023-12-11 ENCOUNTER — Ambulatory Visit: Payer: MEDICAID

## 2023-12-13 ENCOUNTER — Ambulatory Visit: Payer: MEDICAID

## 2023-12-18 ENCOUNTER — Ambulatory Visit: Payer: MEDICAID

## 2023-12-19 ENCOUNTER — Encounter: Payer: Self-pay | Admitting: Psychiatry

## 2023-12-19 ENCOUNTER — Telehealth (INDEPENDENT_AMBULATORY_CARE_PROVIDER_SITE_OTHER): Payer: MEDICAID | Admitting: Psychiatry

## 2023-12-19 DIAGNOSIS — F1994 Other psychoactive substance use, unspecified with psychoactive substance-induced mood disorder: Secondary | ICD-10-CM | POA: Diagnosis not present

## 2023-12-19 DIAGNOSIS — F1021 Alcohol dependence, in remission: Secondary | ICD-10-CM | POA: Diagnosis not present

## 2023-12-19 DIAGNOSIS — F419 Anxiety disorder, unspecified: Secondary | ICD-10-CM | POA: Diagnosis not present

## 2023-12-19 DIAGNOSIS — Z91199 Patient's noncompliance with other medical treatment and regimen due to unspecified reason: Secondary | ICD-10-CM | POA: Diagnosis not present
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
## 2023-12-20 ENCOUNTER — Ambulatory Visit: Payer: MEDICAID

## 2023-12-20 ENCOUNTER — Encounter: Payer: Self-pay | Admitting: Gastroenterology

## 2023-12-20 ENCOUNTER — Ambulatory Visit: Payer: MEDICAID | Admitting: Gastroenterology

## 2023-12-20 VITALS — BP 144/87 | HR 82 | Temp 98.5°F | Wt 225.0 lb

## 2023-12-20 DIAGNOSIS — F1019 Alcohol abuse with unspecified alcohol-induced disorder: Secondary | ICD-10-CM | POA: Diagnosis not present

## 2023-12-20 DIAGNOSIS — K7031 Alcoholic cirrhosis of liver with ascites: Secondary | ICD-10-CM | POA: Diagnosis not present

## 2023-12-20 DIAGNOSIS — R197 Diarrhea, unspecified: Secondary | ICD-10-CM

## 2023-12-20 DIAGNOSIS — Z1211 Encounter for screening for malignant neoplasm of colon: Secondary | ICD-10-CM

## 2023-12-20 DIAGNOSIS — D696 Thrombocytopenia, unspecified: Secondary | ICD-10-CM

## 2023-12-20 DIAGNOSIS — D6869 Other thrombophilia: Secondary | ICD-10-CM

## 2023-12-20 MED ORDER — NADOLOL 20 MG PO TABS: 40.0000 mg | ORAL_TABLET | Freq: Every day | ORAL | Status: AC

## 2023-12-20 NOTE — Patient Instructions (Addendum)
 STOP the Metorprol the day you pick up the Nadolol  Your ultrasound at [facility] [address] [place], [place] [address] Arrive at 8:30am to register and nothing to eat or drink after 12 midnight the day before.   If you need to cancel or reschedule, please call [phone]

## 2023-12-20 NOTE — Progress Notes (Signed)
 Gastroenterology Consultation  Referring Provider:     Carin Gauze, NP Primary Care Physician:  Pcp, No Primary Gastroenterologist:  Dr. Melany     Reason for Consultation:     Follow-up new diagnosis cirrhosis        HPI:   Lawrence Rowe is a 53 y.o. y/o male referred for consultation & management of alcoholic cirrhosis by Dr. Freddrick, No.   He has had 6 admissions over the last 2 years for alcohol  withdrawal with DTs, and was most recently admitted in July 2025 for same, with MELD score 18.  Imaging at that time showed nodular morphology consistent with new diagnosis cirrhosis and trace ascites.  He was discharged on a phenobarbital  taper, B1, a multivitamin and folic acid  as well as furosemide  20 milligrams. He is currently on metoprolol  XL 50 mg for hypertension.  His labs had improved on recheck 2 months later, with bilirubin 1.8 going to 1.2, normalization of transaminitis, platelets 133.  He is HBV and HCV negative.  His INR was 1.8 and has not been updated.  Family history liver cancer - no.  He reports intermittent diarrhea that occurs off and on 1-2 times in the morning, and is urgent.  There is no blood.  It has been present for many years, but seems worse since his discharge and sobriety.  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 Authorizing Provider  folic acid  (FOLVITE ) 1 MG tablet Take 1 tablet (1 mg total) by mouth daily. 09/24/23   Scoggins, Hospital Doctor, NP  furosemide  (LASIX ) 20 MG tablet TAKE ONE TABLET DAILY AS NEEDED 12/10/23   Gollan, Timothy J, MD  hydrOXYzine  (ATARAX ) 10 MG tablet TAKE 1 TABLET BY MOUTH 3 TIMES DAILY AS NEEDED 12/05/23   Scoggins, Hospital Doctor,  NP  metoprolol  succinate (TOPROL -XL) 50 MG 24 hr tablet Take 1 tablet (50 mg total) by mouth daily. Take with or immediately following a meal. 11/09/23   Gollan, Evalene PARAS, MD  mirtazapine  (REMERON ) 7.5 MG tablet Take 1 tablet (7.5 mg total) by mouth at bedtime. 10/24/23   Eappen, Saramma, MD  Multiple Vitamin (MULTIVITAMIN WITH MINERALS) TABS tablet Take 1 tablet by mouth daily. 07/25/23   Barbarann Nest, MD  Secukinumab  (COSENTYX  UNOREADY) 300 MG/2ML SOAJ Inject 300 mg into the skin once a week. 12/12/23   Claudene Lehmann, MD  Secukinumab  (COSENTYX  UNOREADY) 300 MG/2ML SOAJ Inject 300 mg into the skin every 28 (twenty-eight) days. 01/16/24   Claudene Lehmann, MD  thiamine  (VITAMIN B-1) 100 MG tablet Take 1 tablet (100 mg total) by mouth daily. 09/24/23   Scoggins, Amber, NP  triamcinolone  cream (KENALOG ) 0.1 % Apply twice daily to affected areas until improved, for psoriasis. Avoid applying to face, groin, and axilla. 11/01/23   Claudene Lehmann, MD    Family History  Problem Relation Age of Onset   Diabetes Mother    Bladder Cancer Father    Lupus Sister    Alcohol  abuse Neg Hx    Mental illness Neg Hx      Social History[1]  Allergies as of 12/20/2023 - Review Complete 12/20/2023  Allergen Reaction Noted   Penicillins Other (See  Comments) 10/29/2020    Review of Systems:    All systems reviewed and negative except where noted in HPI.   Physical Exam:  BP (!) 144/87 (BP Location: Left Arm, Patient Position: Sitting, Cuff Size: Normal)   Pulse 82   Temp 98.5 F (36.9 C) (Oral)   Wt 225 lb (102.1 kg)   BMI 30.52 kg/m  No LMP for male patient. General:   Alert,  Well-developed, well-nourished, pleasant and cooperative in NAD Head:  Normocephalic and atraumatic. Eyes:  Sclera clear, no icterus.   Conjunctiva pink. Ears:  Normal auditory acuity. Neck:  Supple; no masses or thyromegaly. Lungs:  Respirations even and unlabored.  Clear throughout to auscultation.   No  wheezes, crackles, or rhonchi. No acute distress. Heart:  Regular rate and rhythm; no murmurs, clicks, rubs, or gallops. Abdomen:  Normal bowel sounds.  No bruits.  Soft, non-tender and non-distended without masses, hepatosplenomegaly or hernias noted.  No guarding or rebound tenderness.  Negative Carnett sign.   Rectal:  Deferred.  Pulses:  Normal pulses noted. Extremities:  No clubbing or edema.  No cyanosis. Neurologic:  Alert and oriented x3;  grossly normal neurologically. Skin:  Intact without significant lesions or rashes.  No jaundice. Lymph Nodes:  No significant cervical adenopathy. Psych:  Alert and cooperative. Normal mood and affect.  Imaging Studies: No results found.  Assessment and Plan:   Lawrence Rowe is a 53 y.o. y/o male with a long history of alcohol  abuse, and a new diagnosis of alcoholic cirrhosis.  I am not sure that he understands how important it is for him to remain abstinent from alcohol .  His male friend accompanied him today and we discussed this for a considerable period of time.  I outlined what he could expect if he remained abstinent, and what he could expect if he returned to drinking.  Plan upper endoscopy to screen for esophageal varices.  At the same time, given his complaint of diarrhea, I will biopsy for celiac disease.  He also needs a repeated INR, and ultrasound every 6 months along with AFP.  His risk of liver cancer was reviewed with him.  Finally, he is 53 years old and has never had colon cancer screening.  Plan colonoscopy with random biopsies for microscopic colitis.    Clotilda Schaffer, MD   Note: This dictation was prepared with Dragon dictation along with smaller phrase technology. Any transcriptional errors that result from this process are unintentional.       [1]  Social History Tobacco Use   Smoking status: Former    Current packs/day: 0.00    Types: Cigarettes    Quit date: 2020    Years since quitting: 5.9    Smokeless tobacco: Never  Vaping Use   Vaping status: Never Used  Substance Use Topics   Alcohol  use: Not Currently    Alcohol /week: 12.0 standard drinks of alcohol     Types: 12 Cans of beer per week    Comment: daily 12 pk-- Patient has recently quit drinking; 07/11/2023.   Drug use: Not Currently    Types: Marijuana

## 2023-12-21 ENCOUNTER — Ambulatory Visit: Payer: Self-pay | Admitting: Gastroenterology

## 2023-12-21 LAB — PROTIME-INR
INR: 1.1 (ref 0.9–1.2)
Prothrombin Time: 12.3 s — ABNORMAL HIGH (ref 9.1–12.0)

## 2023-12-21 LAB — AFP TUMOR MARKER: AFP, Serum, Tumor Marker: 3.7 ng/mL (ref 0.0–8.4)

## 2023-12-24 ENCOUNTER — Encounter: Payer: Self-pay | Admitting: Gastroenterology

## 2023-12-25 ENCOUNTER — Ambulatory Visit: Payer: MEDICAID | Attending: Cardiology

## 2023-12-25 ENCOUNTER — Ambulatory Visit: Payer: MEDICAID

## 2023-12-25 DIAGNOSIS — M6281 Muscle weakness (generalized): Secondary | ICD-10-CM | POA: Diagnosis present

## 2023-12-25 DIAGNOSIS — R262 Difficulty in walking, not elsewhere classified: Secondary | ICD-10-CM | POA: Diagnosis present

## 2023-12-25 NOTE — Therapy (Signed)
 OUTPATIENT PHYSICAL THERAPY TREATMENT Recertification   Dates of reporting period  10/25/2023   to   12/25/2023  Patient Name: Lawrence Rowe MRN: [id number] DOB:[birth year], 53 y.o., male Today's Date: 12/25/2023  PCP: Freddrick, No REFERRING PROVIDER: Carin Gauze, NP  END OF SESSION:  PT End of Session - 12/25/23 1533     Visit Number 4    Number of Visits 12    Date for Recertification  02/05/24    Authorization Type [facility] Tailored Plan    Authorization Time Period 10/25/23-12/06/23    Progress Note Due on Visit 10    PT Start Time 1535    PT Stop Time 1614    PT Time Calculation (min) 39 min    Equipment Utilized During Treatment Gait belt    Activity Tolerance Patient tolerated treatment well;No increased pain    Behavior During Therapy WFL for tasks assessed/performed            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)    GERD (gastroesophageal reflux disease)    Hypertension    Past Surgical History:  Procedure Laterality Date   ELBOW SURGERY Left    Patient Active Problem List   Diagnosis Date Noted   Noncompliance with treatment plan 12/19/2023   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   Anxiety disorder 10/23/2023   Encounter to establish care 09/24/2023   Alcohol  withdrawal delirium, acute, hypoactive (HCC) 07/20/2023   Elevated LFTs 12/22/2022   Serum total bilirubin elevated 12/22/2022   GERD (gastroesophageal reflux disease) 12/22/2022   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   Lactic acidosis 06/14/2022   Alcohol  withdrawal with inpatient treatment (HCC) 06/08/2022   High anion gap metabolic acidosis 06/08/2022   Overweight (BMI 25.0-29.9) 01/12/2022   Fatty liver 01/12/2022   Abnormal liver function 01/11/2022   Wernicke encephalopathy syndrome 12/27/2021   Delirium tremens (HCC) 12/27/2021   Alcoholic hepatitis (HCC)  12/27/2021   Alcohol  withdrawal (HCC) 12/23/2021   Transaminitis 12/23/2021   Unilateral edema of lower extremity 12/23/2021   Dermatitis 05/10/2021   Primary hypertension 05/10/2021   Tachycardia with hypertension 04/19/2021   PAD (peripheral artery disease) 04/19/2021   Alcohol  use disorder, severe, in early remission (HCC) 04/19/2021   Metabolic syndrome 04/19/2021   ONSET DATE: chronic progressive REFERRING DIAG: Weakness in legs   THERAPY DIAG:  Muscle weakness (generalized)  Difficulty in walking, not elsewhere classified  Rationale for Evaluation and Treatment: Rehabilitation  SUBJECTIVE:  SUBJECTIVE STATEMENT: -Patient reports is doing well today. He reports that he has not been able to attend PT frequently due to being busy with re-renovating home. He reports that he does feel like his balance has improved recently, but his knees have been bothering him due to pain, especially when going up/down stairs.   From Initial Eval: Pt has noticed some changes in leg strength/balance that have not improved since his most recent line of hospitalizations.   PERTINENT HISTORY:   53yoM referred to OPPT by cardiology at recent visit while being established. Pt is s/p multiple recent admissions for ETOH abuse/withdrawal, reports persistent weakness in legs and imbalance since getting sober in July. Pt reports no prior difficulty with these issues. Pt is fully independent at present, uses no device for mobility, no falls history, but is concerned about confidence with stairs performance and feels he could not AMB longer recreational distances >1 mile at present.   PAIN:  Are you having pain? No  PRECAUTIONS: Falls  WEIGHT BEARING RESTRICTIONS: No  FALLS: Has patient fallen in last 6 months?  No  LIVING ENVIRONMENT: Lives with: lives with their family Lives in: House/apartment Stairs: at his place has 5-6 steps to enter, full flight up, full flight down.  Has following equipment at home: None  *currently living with ex-girlfriend    PATIENT GOALS: regain stamina, strength   OBJECTIVE:  Note: Objective measures were completed at evaluation unless otherwise noted.  PATIENT SURVEYS:  ABC Scale: 78.1% 30sec chair rise: 8x (2 LOB anteiror, wide based stance)  MMT: 5/5 overall, except for hamstrings 4+/5 bilat, Rt hip flexion 4+/5  Sensory screening equal bilat, light touch sensation, later steps on his big toe without awareness of this Dysmetria bilat: Left > Right (finger to nose to author's finger)  Very mild dysdiadochokinesia Wide based ataxia gait pattern SLS < 2sec bilat over 4 attempts bilat  Tandem stance 2sec bilat  Narrow stance eyes open: >30sec; eyes closed>30sec (similar performance) Narrow stance eyes closed on foam surface: <5sec, increased trunk ataxia Normalish stance eyes closed on incline: >30sec, mild increase sway, similar to firm surface, flat                                                                                                                              TREATMENT DATE 12/25/2023 :   -Squats on airex  -semi-tandem on airex 3x30''  -Romberg stance with EC 3x30''  -Standing marches on Airex x20  -Goal Assessments: See goals section.   -ABC  -Airex EC with NBOS  -30'' Sit To Stand  PATIENT EDUCATION: Education details: see above Person educated: Patient Education method: collaborative learning, deliberate practice, positive reinforcement, explicit instruction, establish rules. Education comprehension: Fair  HOME EXERCISE PROGRAM:  Access Code: [email or web address] URL: [email or web address] Date: 11/06/2023 Prepared by: Connell Kiss  Exercises - Sit to Stand with Counter Support  - 3 x daily - 1 sets - 10 reps - Standing  Near Stance in [place] with Eyes Closed  - 1 x daily - 7 x weekly - 3 sets - 30 seconds hold - Standing Romberg to 1/2 Tandem Stance  - 1 x daily - 7 x weekly - 2 sets - 10 reps - Lunge with Counter Support  - 1 x daily - 7 x weekly - 2 sets - 5 reps    GOALS: Goals reviewed with patient? Yes  SHORT TERM GOALS: Target date: 01/22/2024  30sec chair rise >10 from standard chair without LOB Baseline: 8x c 2 LOB Goal status: 12/25/2023: 9x without LOB.  2.  >855ft without LOB, with LRAD to facilitate safe limited community distance AMB for IADL. Baseline:  Goal status: INITIAL  3.  Pt to demonstrate narrow stance eyes closed balance on foam >15sec to demonstrate improvement in trunk ataxia and reduce falls risk. Baseline: <5sec  Goal status: 12/25/2023: 5''.   4.  Pt to report regular walking program 5-7 days per week.  Baseline: No formal exercise.  Goal status: 12/25/2023: Ongoing: Pt reports he has not been able to get out that often to walk recently.   5. Pt to demonstrate modI performance of 24 stairs up and 24 stairs down without LOB to decrease likelihood of adverse effect on stairs in home for bedroom access and laundry processing.  Baseline:   Goal status: 12/25/2023: Ongoing: Patient unable to go up stairs due to bilateral knee pain and LE weakness.   LONG TERM GOALS: Target date: 02/05/2024  30sec chair rise >14x from standard chair without LOB Baseline: 8x c 2 LOB Goal status: INITIAL  2.  >15101ft without LOB, with LRAD to facilitate safe community distance AMB for IADL, leisure, and wellness. Baseline:  10/28: 1091 feet (332.5 meters, Avg speed 0.59m/s), no AD with supervision Goal status: INITIAL  3.  Pt to demonstrate narrow stance eyes closed balance on foam >30sec to demonstrate improvement in trunk ataxia and reduce falls risk. Baseline: <5sec  Goal status: 12/25/2023: Ongoing 5'' with EC on airex with NBOS.  4.  Pt to report regular walking program  5-7 days per week, regular resistance training program 2-3x weekly, and balance training regimen 3-5x weekly for continued progress toward baseline mobility and improved self efficacy in management of mental health.  Baseline: No formal exercise.  Goal status: 12/25/2023: Ongoing, reports he has not made time to walk.  5. Pt to improve score on ABC scale >15% to indication improved confidence in balance over a variety of different activity.   Baseline: 78%  Goal Status: 12/25/2023: Ongoing: 76.25% ASSESSMENT:  CLINICAL IMPRESSION:  Today was patient's 4th visit, however, his certification period was past due therefore his goals were re-assessed today.   Patient was noted to continue to have ataxic gait pattern with wide step width. Decreased balance noted with standing on airex as he was only able to perform romberg stance with EC on airex for 5'' today. He also was noted to have decreased activity tolerance becoming fatigued with 30'' STS test which he improved by 1 since evaluation. Patient was not able to perform stairs today due to significant increase in bilateral knee pain when attempting. He also wsa noted to have slightly reduced confidence on ABC outcome.   Overall, patient has not been able to attend PT treatments, but only 2x since his evaluation 2 months ago resulting in minimal to no progress since evaluation. I do believe patient would benefit from skilled PT if he is more consistent  with attending his PT treatments and if he implements HEP at home home. I discussed the importance of both of these with the patient which he agreed that he would/needed to attend more often. Patient will benefit from skilled PT to improve gait, balance, LE strength, and knee pain allowing him to perform ADLs, gait, and functional tasks with decreased difficulty.   OBJECTIVE IMPAIRMENTS: Decreased knowledge of condition, decreased use of DME, decreased mobility, difficulty walking, decreased strength,  decreased ROM. ACTIVITY LIMITATIONS: Lifting, standing, walking, squatting, transfers, locomotion level PARTICIPATION LIMITATIONS: Cleaning, laundry, interpersonal relationships, driving, yardwork, community activity.  PERSONAL FACTORS: Age, behavior pattern, education, past/current experiences, transportation, profession  are also affecting patient's functional outcome.  REHAB POTENTIAL: Good CLINICAL DECISION MAKING: Medium  EVALUATION COMPLEXITY: Moderate    PLAN:  PT FREQUENCY: 1-2x/week  PT DURATION: 6 weeks  PLANNED INTERVENTIONS: 97110-Therapeutic exercises, 97530- Therapeutic activity, 97112- Neuromuscular re-education, [phone]- Self Care, [id number]- Manual therapy, [phone]- Gait training, [phone]- Electrical stimulation (unattended), Patient/Family education, Balance training, Stair training, Vestibular training, Visual/preceptual remediation/compensation, Cryotherapy, and Moist heat  PLAN FOR NEXT SESSION:  Follow-up on expanded HEP Sagittal plane hip strength B LE strengthening exercises to decrease knee pain with stair navigation Progress balance,     Note: Portions of this document were prepared using Dragon voice recognition software and although reviewed may contain unintentional dictation errors in syntax, grammar, or spelling.  Norman KATHEE Sharps PT ,DPT Physical Therapist- [place]  [facility]   5:31 PM 12/25/2023

## 2023-12-26 ENCOUNTER — Ambulatory Visit
Admission: RE | Admit: 2023-12-26 | Discharge: 2023-12-26 | Payer: MEDICAID | Attending: Gastroenterology | Admitting: Gastroenterology

## 2023-12-26 DIAGNOSIS — K7031 Alcoholic cirrhosis of liver with ascites: Secondary | ICD-10-CM | POA: Diagnosis present

## 2023-12-27 ENCOUNTER — Ambulatory Visit: Payer: MEDICAID

## 2023-12-31 ENCOUNTER — Telehealth: Payer: Self-pay

## 2023-12-31 DIAGNOSIS — I10 Essential (primary) hypertension: Secondary | ICD-10-CM

## 2023-12-31 MED ORDER — NADOLOL 40 MG PO TABS: 40.0000 mg | ORAL_TABLET | Freq: Every day | ORAL | 1 refills | Status: AC

## 2023-12-31 NOTE — Telephone Encounter (Signed)
 Camie Misty  left message on Friday. She was calling for pt Lawrence Rowe. Pt thinks MD stated she would send in a prescription  for toprol -xl, but it wasn't at the pharmacy.. His prep was but not the other medication

## 2023-12-31 NOTE — Telephone Encounter (Signed)
 Rx was sent to the pharmacy as it was previously ordered as clinic administered  Left message on voicemail

## 2024-01-01 ENCOUNTER — Ambulatory Visit: Payer: MEDICAID

## 2024-01-01 NOTE — Therapy (Incomplete)
 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   Anxiety disorder 10/23/2023   Encounter to establish care 09/24/2023   Alcohol  withdrawal delirium, acute, hypoactive (HCC) 07/20/2023   Elevated LFTs 12/22/2022   Serum total bilirubin elevated 12/22/2022   GERD (gastroesophageal reflux disease) 12/22/2022   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   Lactic acidosis 06/14/2022   Alcohol  withdrawal with inpatient treatment (HCC) 06/08/2022   High anion gap metabolic acidosis 06/08/2022   Overweight (BMI 25.0-29.9) 01/12/2022   Fatty liver 01/12/2022   Abnormal liver function 01/11/2022   Wernicke encephalopathy syndrome 12/27/2021   Delirium tremens (HCC) 12/27/2021   Alcoholic hepatitis (HCC) 12/27/2021   Alcohol  withdrawal (HCC) 12/23/2021   Transaminitis 12/23/2021   Unilateral edema of lower extremity 12/23/2021   Dermatitis 05/10/2021   Primary hypertension 05/10/2021   Tachycardia with hypertension 04/19/2021   PAD (peripheral artery disease) 04/19/2021   Alcohol  use disorder, severe, in early remission (HCC) 04/19/2021   Metabolic syndrome 04/19/2021   ONSET DATE: chronic progressive REFERRING DIAG: Weakness in legs   THERAPY DIAG:  Muscle weakness (generalized)  Difficulty in walking, not elsewhere classified  Rationale for Evaluation and  Treatment: Rehabilitation  SUBJECTIVE:                                                                                                                                                                                             SUBJECTIVE STATEMENT: ***  -Patient reports is doing well today. He reports that he has not been able to attend PT frequently due to being busy with re-renovating home. He reports that he does feel like his balance has improved recently, but his knees have been bothering him due to pain, especially when going up/down stairs.   From Initial Eval: Pt has noticed some changes in leg strength/balance that have not improved since his most recent line of hospitalizations.   PERTINENT HISTORY:   53yoM referred to OPPT by cardiology at recent visit while being established. Pt is s/p  multiple recent admissions for ETOH abuse/withdrawal, reports persistent weakness in legs and imbalance since getting sober in July. Pt reports no prior difficulty with these issues. Pt is fully independent at present, uses no device for mobility, no falls history, but is concerned about confidence with stairs performance and feels he could not AMB longer recreational distances >1 mile at present.   PAIN:  Are you having pain? No  PRECAUTIONS: Falls  WEIGHT BEARING RESTRICTIONS: No  FALLS: Has patient fallen in last 6 months? No  LIVING ENVIRONMENT: Lives with: lives with their family Lives in: House/apartment Stairs: at his place has 5-6 steps to enter, full flight up, full flight down.  Has following equipment at home: None  *currently living with ex-girlfriend    PATIENT GOALS: regain stamina, strength   OBJECTIVE:  Note: Objective measures were completed at evaluation unless otherwise noted.  PATIENT SURVEYS:  ABC Scale: 78.1% 30sec chair rise: 8x (2 LOB anteiror, wide based stance)  MMT: 5/5 overall, except for hamstrings 4+/5 bilat, Rt hip flexion 4+/5  Sensory screening equal  bilat, light touch sensation, later steps on his big toe without awareness of this Dysmetria bilat: Left > Right (finger to nose to author's finger)  Very mild dysdiadochokinesia Wide based ataxia gait pattern SLS < 2sec bilat over 4 attempts bilat  Tandem stance 2sec bilat  Narrow stance eyes open: >30sec; eyes closed>30sec (similar performance) Narrow stance eyes closed on foam surface: <5sec, increased trunk ataxia Normalish stance eyes closed on incline: >30sec, mild increase sway, similar to firm surface, flat                                                                                                                              TREATMENT DATE 01/01/2024 :  *** -Squats on airex  -semi-tandem on airex 3x30''  -Romberg stance with EC 3x30''  -Standing marches on Airex x20  -Goal Assessments: See goals section.   -ABC  -Airex EC with NBOS  -30'' Sit To Stand  PATIENT EDUCATION: Education details: see above Person educated: Patient Education method: collaborative learning, deliberate practice, positive reinforcement, explicit instruction, establish rules. Education comprehension: Fair  HOME EXERCISE PROGRAM:  Access Code: [email or web address] URL: [email or web address] Date: 11/06/2023 Prepared by: Connell Kiss  Exercises - Sit to Stand with Counter Support  - 3 x daily - 1 sets - 10 reps - Standing Near Stance in [place] with Eyes Closed  - 1 x daily - 7 x weekly - 3 sets - 30 seconds hold - Standing Romberg to 1/2 Tandem Stance  - 1 x daily - 7 x weekly - 2 sets - 10 reps - Lunge with Counter Support  - 1 x daily - 7 x weekly - 2 sets - 5 reps    GOALS: Goals reviewed with patient? Yes  SHORT TERM GOALS: Target date: 01/22/2024  30sec chair rise >10 from standard chair without LOB Baseline: 8x c 2 LOB Goal status: 12/25/2023: 9x  without LOB.  2.  >857ft without LOB, with LRAD to facilitate safe limited community distance AMB for IADL. Baseline:  Goal  status: INITIAL  3.  Pt to demonstrate narrow stance eyes closed balance on foam >15sec to demonstrate improvement in trunk ataxia and reduce falls risk. Baseline: <5sec  Goal status: 12/25/2023: 5''.   4.  Pt to report regular walking program 5-7 days per week.  Baseline: No formal exercise.  Goal status: 12/25/2023: Ongoing: Pt reports he has not been able to get out that often to walk recently.   5. Pt to demonstrate modI performance of 24 stairs up and 24 stairs down without LOB to decrease likelihood of adverse effect on stairs in home for bedroom access and laundry processing.  Baseline:   Goal status: 12/25/2023: Ongoing: Patient unable to go up stairs due to bilateral knee pain and LE weakness.   LONG TERM GOALS: Target date: 02/05/2024  30sec chair rise >14x from standard chair without LOB Baseline: 8x c 2 LOB Goal status: INITIAL  2.  >1534ft without LOB, with LRAD to facilitate safe community distance AMB for IADL, leisure, and wellness. Baseline:  10/28: 1091 feet (332.5 meters, Avg speed 0.66m/s), no AD with supervision Goal status: INITIAL  3.  Pt to demonstrate narrow stance eyes closed balance on foam >30sec to demonstrate improvement in trunk ataxia and reduce falls risk. Baseline: <5sec  Goal status: 12/25/2023: Ongoing 5'' with EC on airex with NBOS.  4.  Pt to report regular walking program 5-7 days per week, regular resistance training program 2-3x weekly, and balance training regimen 3-5x weekly for continued progress toward baseline mobility and improved self efficacy in management of mental health.  Baseline: No formal exercise.  Goal status: 12/25/2023: Ongoing, reports he has not made time to walk.  5. Pt to improve score on ABC scale >15% to indication improved confidence in balance over a variety of different activity.   Baseline: 78%  Goal Status: 12/25/2023: Ongoing: 76.25% ASSESSMENT:  CLINICAL IMPRESSION: ***  Today was patient's 4th visit,  however, his certification period was past due therefore his goals were re-assessed today.   Patient was noted to continue to have ataxic gait pattern with wide step width. Decreased balance noted with standing on airex as he was only able to perform romberg stance with EC on airex for 5'' today. He also was noted to have decreased activity tolerance becoming fatigued with 30'' STS test which he improved by 1 since evaluation. Patient was not able to perform stairs today due to significant increase in bilateral knee pain when attempting. He also wsa noted to have slightly reduced confidence on ABC outcome.   Overall, patient has not been able to attend PT treatments, but only 2x since his evaluation 2 months ago resulting in minimal to no progress since evaluation. I do believe patient would benefit from skilled PT if he is more consistent with attending his PT treatments and if he implements HEP at home home. I discussed the importance of both of these with the patient which he agreed that he would/needed to attend more often. Patient will benefit from skilled PT to improve gait, balance, LE strength, and knee pain allowing him to perform ADLs, gait, and functional tasks with decreased difficulty.   OBJECTIVE IMPAIRMENTS: Decreased knowledge of condition, decreased use of DME, decreased mobility, difficulty walking, decreased strength, decreased ROM. ACTIVITY LIMITATIONS: Lifting, standing, walking, squatting, transfers, locomotion level PARTICIPATION LIMITATIONS: Cleaning, laundry, interpersonal relationships, driving, yardwork,  community activity.  PERSONAL FACTORS: Age, behavior pattern, education, past/current experiences, transportation, profession  are also affecting patient's functional outcome.  REHAB POTENTIAL: Good CLINICAL DECISION MAKING: Medium  EVALUATION COMPLEXITY: Moderate    PLAN:  PT FREQUENCY: 1-2x/week  PT DURATION: 6 weeks  PLANNED INTERVENTIONS: 97110-Therapeutic  exercises, 97530- Therapeutic activity, 97112- Neuromuscular re-education, 97535- Self Care, 02859- Manual therapy, [phone]- Gait training, [phone]- Electrical stimulation (unattended), Patient/Family education, Balance training, Stair training, Vestibular training, Visual/preceptual remediation/compensation, Cryotherapy, and Moist heat  PLAN FOR NEXT SESSION:  Follow-up on expanded HEP Sagittal plane hip strength B LE strengthening exercises to decrease knee pain with stair navigation Progress balance,    Maryanne Finder, PT, DPT Physical Therapist - [place]  [facility]  9:51 AM 01/01/2024  "

## 2024-01-07 ENCOUNTER — Telehealth: Payer: Self-pay

## 2024-01-07 DIAGNOSIS — R932 Abnormal findings on diagnostic imaging of liver and biliary tract: Secondary | ICD-10-CM

## 2024-01-07 DIAGNOSIS — K7031 Alcoholic cirrhosis of liver with ascites: Secondary | ICD-10-CM

## 2024-01-07 NOTE — Telephone Encounter (Signed)
 MRI scheduled for 01/15/2024 arrive at 12:30pm, NPO 4 hours prior... Pt is aware and expressed understanding  Pt would like msg sent via Mychart as well

## 2024-01-07 NOTE — Addendum Note (Signed)
 Addended by: LANNIE ANDREA GRADE on: 01/07/2024 02:47 PM   Modules accepted: Orders

## 2024-01-07 NOTE — Telephone Encounter (Signed)
-----   Message from Clotilda Schaffer, MD sent at 01/07/2024 12:26 PM EST ----- Regarding: abnormal US  Hi Lawrence Rowe, I hope you had a nice Christmas! Lawrence Rowe's US  shows an area that needs to be looked at more closely with abdominal MRI. Are you able to order that? Ind: Abnormal imaging, cirrhosis. Thank you! Dr. Schaffer

## 2024-01-07 NOTE — Therapy (Incomplete)
 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   Anxiety disorder 10/23/2023   Encounter to establish care 09/24/2023   Alcohol  withdrawal delirium, acute, hypoactive (HCC) 07/20/2023   Elevated LFTs 12/22/2022   Serum total bilirubin elevated 12/22/2022   GERD (gastroesophageal reflux disease) 12/22/2022   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   Lactic acidosis 06/14/2022   Alcohol  withdrawal with inpatient treatment (HCC) 06/08/2022   High anion gap metabolic acidosis 06/08/2022   Overweight (BMI 25.0-29.9) 01/12/2022   Fatty liver 01/12/2022   Abnormal liver function 01/11/2022   Wernicke encephalopathy syndrome 12/27/2021   Delirium tremens (HCC) 12/27/2021   Alcoholic hepatitis (HCC) 12/27/2021   Alcohol  withdrawal (HCC) 12/23/2021   Transaminitis 12/23/2021   Unilateral edema of lower extremity 12/23/2021   Dermatitis 05/10/2021   Primary hypertension 05/10/2021   Tachycardia with hypertension 04/19/2021   PAD (peripheral artery disease) 04/19/2021   Alcohol  use disorder, severe, in early remission (HCC) 04/19/2021   Metabolic syndrome 04/19/2021   ONSET DATE: chronic progressive REFERRING DIAG: Weakness in legs   THERAPY DIAG:  No diagnosis found.  Rationale for Evaluation and Treatment: Rehabilitation  SUBJECTIVE:                                                                                                                                                                                              SUBJECTIVE STATEMENT: ***  -Patient reports is doing well today. He reports that he has not been able to attend PT frequently due to being busy with re-renovating home. He reports that he does feel like his balance has improved recently, but his knees have been bothering him due to pain, especially when going up/down stairs.   From Initial Eval: Pt has noticed some changes in leg strength/balance that have not improved since his most recent line of hospitalizations.   PERTINENT HISTORY:   53yoM referred to OPPT by cardiology at recent visit while being established. Pt is s/p multiple recent admissions for ETOH abuse/withdrawal, reports  persistent weakness in legs and imbalance since getting sober in July. Pt reports no prior difficulty with these issues. Pt is fully independent at present, uses no device for mobility, no falls history, but is concerned about confidence with stairs performance and feels he could not AMB longer recreational distances >1 mile at present.   PAIN:  Are you having pain? No  PRECAUTIONS: Falls  WEIGHT BEARING RESTRICTIONS: No  FALLS: Has patient fallen in last 6 months? No  LIVING ENVIRONMENT: Lives with: lives with their family Lives in: House/apartment Stairs: at his place has 5-6 steps to enter, full flight up, full flight down.  Has following equipment at home: None  *currently living with ex-girlfriend    PATIENT GOALS: regain stamina, strength   OBJECTIVE:  Note: Objective measures were completed at evaluation unless otherwise noted.  PATIENT SURVEYS:  ABC Scale: 78.1% 30sec chair rise: 8x (2 LOB anteiror, wide based stance)  MMT: 5/5 overall, except for hamstrings 4+/5 bilat, Rt hip flexion 4+/5  Sensory screening equal bilat, light touch sensation, later steps on his big toe without  awareness of this Dysmetria bilat: Left > Right (finger to nose to author's finger)  Very mild dysdiadochokinesia Wide based ataxia gait pattern SLS < 2sec bilat over 4 attempts bilat  Tandem stance 2sec bilat  Narrow stance eyes open: >30sec; eyes closed>30sec (similar performance) Narrow stance eyes closed on foam surface: <5sec, increased trunk ataxia Normalish stance eyes closed on incline: >30sec, mild increase sway, similar to firm surface, flat                                                                                                                              TREATMENT DATE 01/07/2024 :  TA- To improve functional movements patterns for everyday tasks    Nustep interval training rolling hills mode level 3-7 x 8 min  Step up no UE support to step trainer 2 x 10 ea LE - careful with all activities to avoid knee pain, decreased control noted with step ups and down   NMR: To facilitate reeducation of movement, balance, posture, coordination, and/or proprioception/kinesthetic sense.   Aide step up to airex pad 2 x 10 ea LE Side step on and off airex x 10 ea LE  Step tap to 6 in step from airex 2 x 10 ea LE, cues for foot placement for optimal performance   TE- To improve strength, endurance, mobility, and function of specific targeted muscle groups or improve joint range of motion or improve muscle flexibility   Standing terminal knee knee extension x 15 ea with GTB - cues for depth and control   PATIENT EDUCATION: Education details: see above Person educated: Patient Education method: collaborative learning, deliberate practice, positive reinforcement, explicit instruction, establish rules. Education comprehension: Fair  HOME EXERCISE PROGRAM:  Access Code: [email or web address] URL: [email or web address] Date: 11/06/2023 Prepared by: Connell Kiss  Exercises - Sit to Stand with Counter Support  - 3 x  daily - 1 sets - 10 reps - Standing Near Stance in [place] with  Eyes Closed  - 1 x daily - 7 x weekly - 3 sets - 30 seconds hold - Standing Romberg to 1/2 Tandem Stance  - 1 x daily - 7 x weekly - 2 sets - 10 reps - Lunge with Counter Support  - 1 x daily - 7 x weekly - 2 sets - 5 reps    GOALS: Goals reviewed with patient? Yes  SHORT TERM GOALS: Target date: 01/22/2024  30sec chair rise >10 from standard chair without LOB Baseline: 8x c 2 LOB Goal status: 12/25/2023: 9x without LOB.  2.  >840ft without LOB, with LRAD to facilitate safe limited community distance AMB for IADL. Baseline:  Goal status: INITIAL  3.  Pt to demonstrate narrow stance eyes closed balance on foam >15sec to demonstrate improvement in trunk ataxia and reduce falls risk. Baseline: <5sec  Goal status: 12/25/2023: 5''.   4.  Pt to report regular walking program 5-7 days per week.  Baseline: No formal exercise.  Goal status: 12/25/2023: Ongoing: Pt reports he has not been able to get out that often to walk recently.   5. Pt to demonstrate modI performance of 24 stairs up and 24 stairs down without LOB to decrease likelihood of adverse effect on stairs in home for bedroom access and laundry processing.  Baseline:   Goal status: 12/25/2023: Ongoing: Patient unable to go up stairs due to bilateral knee pain and LE weakness.   LONG TERM GOALS: Target date: 02/05/2024  30sec chair rise >14x from standard chair without LOB Baseline: 8x c 2 LOB Goal status: INITIAL  2.  >1547ft without LOB, with LRAD to facilitate safe community distance AMB for IADL, leisure, and wellness. Baseline:  10/28: 1091 feet (332.5 meters, Avg speed 0.60m/s), no AD with supervision Goal status: INITIAL  3.  Pt to demonstrate narrow stance eyes closed balance on foam >30sec to demonstrate improvement in trunk ataxia and reduce falls risk. Baseline: <5sec  Goal status: 12/25/2023: Ongoing 5'' with EC on airex with NBOS.  4.  Pt to report regular walking program 5-7 days per week, regular  resistance training program 2-3x weekly, and balance training regimen 3-5x weekly for continued progress toward baseline mobility and improved self efficacy in management of mental health.  Baseline: No formal exercise.  Goal status: 12/25/2023: Ongoing, reports he has not made time to walk.  5. Pt to improve score on ABC scale >15% to indication improved confidence in balance over a variety of different activity.   Baseline: 78%  Goal Status: 12/25/2023: Ongoing: 76.25% ASSESSMENT:  CLINICAL IMPRESSION: ***  Today was patient's 4th visit, however, his certification period was past due therefore his goals were re-assessed today.   Patient was noted to continue to have ataxic gait pattern with wide step width. Decreased balance noted with standing on airex as he was only able to perform romberg stance with EC on airex for 5'' today. He also was noted to have decreased activity tolerance becoming fatigued with 30'' STS test which he improved by 1 since evaluation. Patient was not able to perform stairs today due to significant increase in bilateral knee pain when attempting. He also wsa noted to have slightly reduced confidence on ABC outcome.   Overall, patient has not been able to attend PT treatments, but only 2x since his evaluation 2 months ago resulting in minimal to no progress since evaluation. I do believe  patient would benefit from skilled PT if he is more consistent with attending his PT treatments and if he implements HEP at home home. I discussed the importance of both of these with the patient which he agreed that he would/needed to attend more often. Patient will benefit from skilled PT to improve gait, balance, LE strength, and knee pain allowing him to perform ADLs, gait, and functional tasks with decreased difficulty.   OBJECTIVE IMPAIRMENTS: Decreased knowledge of condition, decreased use of DME, decreased mobility, difficulty walking, decreased strength, decreased ROM. ACTIVITY  LIMITATIONS: Lifting, standing, walking, squatting, transfers, locomotion level PARTICIPATION LIMITATIONS: Cleaning, laundry, interpersonal relationships, driving, yardwork, community activity.  PERSONAL FACTORS: Age, behavior pattern, education, past/current experiences, transportation, profession  are also affecting patient's functional outcome.  REHAB POTENTIAL: Good CLINICAL DECISION MAKING: Medium  EVALUATION COMPLEXITY: Moderate    PLAN:  PT FREQUENCY: 1-2x/week  PT DURATION: 6 weeks  PLANNED INTERVENTIONS: 97110-Therapeutic exercises, 97530- Therapeutic activity, 97112- Neuromuscular re-education, 97535- Self Care, 02859- Manual therapy, [phone]- Gait training, [phone]- Electrical stimulation (unattended), Patient/Family education, Balance training, Stair training, Vestibular training, Visual/preceptual remediation/compensation, Cryotherapy, and Moist heat  PLAN FOR NEXT SESSION:  Follow-up on expanded HEP Sagittal plane hip strength B LE strengthening exercises to decrease knee pain with stair navigation Progress balance,   Katrell Milhorn  Leopoldo, PT, DPT Physical Therapist - [facility] - [facility]  Outpatient Physical Therapy- Main Campus [phone]     9:56 AM 01/07/2024  "

## 2024-01-08 ENCOUNTER — Ambulatory Visit: Payer: MEDICAID

## 2024-01-09 ENCOUNTER — Other Ambulatory Visit: Payer: Self-pay | Admitting: Psychiatry

## 2024-01-09 DIAGNOSIS — F419 Anxiety disorder, unspecified: Secondary | ICD-10-CM

## 2024-01-09 DIAGNOSIS — F1994 Other psychoactive substance use, unspecified with psychoactive substance-induced mood disorder: Secondary | ICD-10-CM

## 2024-01-14 ENCOUNTER — Encounter: Payer: Self-pay | Admitting: Dermatology

## 2024-01-15 ENCOUNTER — Ambulatory Visit: Payer: MEDICAID | Attending: Cardiology

## 2024-01-15 ENCOUNTER — Ambulatory Visit
Admission: RE | Admit: 2024-01-15 | Discharge: 2024-01-15 | Disposition: A | Discharge status: Home / Self Care | Payer: MEDICAID | Source: Ambulatory Visit | Attending: Gastroenterology | Admitting: Gastroenterology

## 2024-01-15 DIAGNOSIS — R932 Abnormal findings on diagnostic imaging of liver and biliary tract: Secondary | ICD-10-CM | POA: Insufficient documentation

## 2024-01-15 DIAGNOSIS — M6281 Muscle weakness (generalized): Secondary | ICD-10-CM | POA: Insufficient documentation

## 2024-01-15 DIAGNOSIS — K7031 Alcoholic cirrhosis of liver with ascites: Secondary | ICD-10-CM | POA: Insufficient documentation

## 2024-01-15 DIAGNOSIS — R262 Difficulty in walking, not elsewhere classified: Secondary | ICD-10-CM | POA: Insufficient documentation

## 2024-01-15 MED ORDER — GADOBUTROL 1 MMOL/ML IV SOLN
10.0000 mL | Freq: Once | INTRAVENOUS | Status: AC | PRN
  Administered 2024-01-15: 10 mL via INTRAVENOUS

## 2024-01-15 NOTE — Therapy (Signed)
 " OUTPATIENT PHYSICAL THERAPY TREATMENT   Patient Name: Lawrence Rowe MRN: [id number] DOB:04/12/1970, 54 y.o., male Today's Date: 01/15/2024  PCP: Carin Gauze, NP REFERRING PROVIDER: Carin Gauze, NP  END OF SESSION:  PT End of Session - 01/15/24 1447     Visit Number 5    Number of Visits 12    Date for Recertification  02/05/24    Authorization Type [facility] Tailored Plan    Authorization Time Period 10/25/23-12/06/23    Progress Note Due on Visit 10    PT Start Time 1448    PT Stop Time 1530    PT Time Calculation (min) 42 min    Equipment Utilized During Treatment Gait belt    Activity Tolerance Patient tolerated treatment well;No increased pain    Behavior During Therapy WFL for tasks assessed/performed             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)    GERD (gastroesophageal reflux disease)    Hypertension    Past Surgical History:  Procedure Laterality Date   ELBOW SURGERY Left    Patient Active Problem List   Diagnosis Date Noted   Noncompliance with treatment plan 12/19/2023   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   Anxiety disorder 10/23/2023   Encounter to establish care 09/24/2023   Alcohol  withdrawal delirium, acute, hypoactive (HCC) 07/20/2023   Elevated LFTs 12/22/2022   Serum total bilirubin elevated 12/22/2022   GERD (gastroesophageal reflux disease) 12/22/2022   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   Lactic acidosis 06/14/2022   Alcohol  withdrawal with inpatient treatment (HCC) 06/08/2022   High anion gap metabolic acidosis 06/08/2022   Overweight (BMI 25.0-29.9) 01/12/2022   Fatty liver 01/12/2022   Abnormal liver function 01/11/2022   Wernicke encephalopathy syndrome 12/27/2021   Delirium tremens (HCC) 12/27/2021   Alcoholic hepatitis (HCC) 12/27/2021   Alcohol  withdrawal (HCC) 12/23/2021    Transaminitis 12/23/2021   Unilateral edema of lower extremity 12/23/2021   Dermatitis 05/10/2021   Primary hypertension 05/10/2021   Tachycardia with hypertension 04/19/2021   PAD (peripheral artery disease) 04/19/2021   Alcohol  use disorder, severe, in early remission (HCC) 04/19/2021   Metabolic syndrome 04/19/2021   ONSET DATE: chronic progressive REFERRING DIAG: Weakness in legs   THERAPY DIAG:  Muscle weakness (generalized)  Difficulty in walking, not elsewhere classified  Rationale for Evaluation and Treatment: Rehabilitation  SUBJECTIVE:  SUBJECTIVE STATEMENT:   -Patient reports he is doing okay today. Pt reports he continues to have bilateral knee pain and is unsure how much he will be able to do today. He reports the R knee continues to hurt worse than the L, but L hurts too. Pain increases with stairs/squats.   From Initial Eval: Pt has noticed some changes in leg strength/balance that have not improved since his most recent line of hospitalizations.   PERTINENT HISTORY:   53yoM referred to OPPT by cardiology at recent visit while being established. Pt is s/p multiple recent admissions for ETOH abuse/withdrawal, reports persistent weakness in legs and imbalance since getting sober in July. Pt reports no prior difficulty with these issues. Pt is fully independent at present, uses no device for mobility, no falls history, but is concerned about confidence with stairs performance and feels he could not AMB longer recreational distances >1 mile at present.   PAIN:  Are you having pain? No  PRECAUTIONS: Falls  WEIGHT BEARING RESTRICTIONS: No  FALLS: Has patient fallen in last 6 months? No  LIVING ENVIRONMENT: Lives with: lives with their family Lives in: House/apartment Stairs: at his  place has 5-6 steps to enter, full flight up, full flight down.  Has following equipment at home: None  *currently living with ex-girlfriend    PATIENT GOALS: regain stamina, strength   OBJECTIVE:  Note: Objective measures were completed at evaluation unless otherwise noted.  PATIENT SURVEYS:  ABC Scale: 78.1% 30sec chair rise: 8x (2 LOB anteiror, wide based stance)  MMT: 5/5 overall, except for hamstrings 4+/5 bilat, Rt hip flexion 4+/5  Sensory screening equal bilat, light touch sensation, later steps on his big toe without awareness of this Dysmetria bilat: Left > Right (finger to nose to author's finger)  Very mild dysdiadochokinesia Wide based ataxia gait pattern SLS < 2sec bilat over 4 attempts bilat  Tandem stance 2sec bilat  Narrow stance eyes open: >30sec; eyes closed>30sec (similar performance) Narrow stance eyes closed on foam surface: <5sec, increased trunk ataxia Normalish stance eyes closed on incline: >30sec, mild increase sway, similar to firm surface, flat                                                                                                                              TREATMENT DATE 01/15/2024 :  TA- To improve functional movements patterns for everyday tasks    Nustep interval training rolling hills mode level 2-5 x 6 min  Gait with 3 lb. AW donned x 450'.    NMR: To facilitate reeducation of movement, balance, posture, coordination, and/or proprioception/kinesthetic sense.   Tandem stance 2x30'' at balance station.  Romberg stance on airex 3x30'' at balance station.  Step tap to 6 in step from airex 2 x 10 ea LE, cues for foot placement for optimal performance Perturbations on airex x30 to improve reactive balance.    TE- To improve strength, endurance, mobility, and function of specific  targeted muscle groups or improve joint range of motion or improve muscle flexibility   [facility]:   HS Curl: 2x10 Level 5.  Leg Extension: 2x10 single leg: Level  1. (Knee pain reported with increased weight).    PATIENT EDUCATION: Education details: see above Person educated: Patient Education method: collaborative learning, deliberate practice, positive reinforcement, explicit instruction, establish rules. Education comprehension: Fair  HOME EXERCISE PROGRAM:  Access Code: [email or web address] URL: [email or web address] Date: 11/06/2023 Prepared by: Connell Kiss  Exercises - Sit to Stand with Counter Support  - 3 x daily - 1 sets - 10 reps - Standing Near Stance in [place] with Eyes Closed  - 1 x daily - 7 x weekly - 3 sets - 30 seconds hold - Standing Romberg to 1/2 Tandem Stance  - 1 x daily - 7 x weekly - 2 sets - 10 reps - Lunge with Counter Support  - 1 x daily - 7 x weekly - 2 sets - 5 reps    GOALS: Goals reviewed with patient? Yes  SHORT TERM GOALS: Target date: 01/22/2024  30sec chair rise >10 from standard chair without LOB Baseline: 8x c 2 LOB Goal status: 12/25/2023: 9x without LOB.  2.  >88ft without LOB, with LRAD to facilitate safe limited community distance AMB for IADL. Baseline:  Goal status: INITIAL  3.  Pt to demonstrate narrow stance eyes closed balance on foam >15sec to demonstrate improvement in trunk ataxia and reduce falls risk. Baseline: <5sec  Goal status: 12/25/2023: 5''.   4.  Pt to report regular walking program 5-7 days per week.  Baseline: No formal exercise.  Goal status: 12/25/2023: Ongoing: Pt reports he has not been able to get out that often to walk recently.   5. Pt to demonstrate modI performance of 24 stairs up and 24 stairs down without LOB to decrease likelihood of adverse effect on stairs in home for bedroom access and laundry processing.  Baseline:   Goal status: 12/25/2023: Ongoing: Patient unable to go up stairs due to bilateral knee pain and LE weakness.   LONG TERM GOALS: Target date: 02/05/2024  30sec chair rise >14x from standard chair without LOB Baseline: 8x c 2  LOB Goal status: INITIAL  2.  >1580ft without LOB, with LRAD to facilitate safe community distance AMB for IADL, leisure, and wellness. Baseline:  10/28: 1091 feet (332.5 meters, Avg speed 0.17m/s), no AD with supervision Goal status: INITIAL  3.  Pt to demonstrate narrow stance eyes closed balance on foam >30sec to demonstrate improvement in trunk ataxia and reduce falls risk. Baseline: <5sec  Goal status: 12/25/2023: Ongoing 5'' with EC on airex with NBOS.  4.  Pt to report regular walking program 5-7 days per week, regular resistance training program 2-3x weekly, and balance training regimen 3-5x weekly for continued progress toward baseline mobility and improved self efficacy in management of mental health.  Baseline: No formal exercise.  Goal status: 12/25/2023: Ongoing, reports he has not made time to walk.  5. Pt to improve score on ABC scale >15% to indication improved confidence in balance over a variety of different activity.   Baseline: 78%  Goal Status: 12/25/2023: Ongoing: 76.25% ASSESSMENT:  CLINICAL IMPRESSION:  Patient warmed up on Nustep today. I attempted to perform exercises/activities today that would not increase patient's bilateral knee pain.  Balance exercises including tandem stance, romberg stance on Airex, and toe taps performed to improve balance. Gait with ankle weights donned performed to increase overall endurance/activity level. He  finished treatment in [facility] using machine for HS curls and knee extension exercises which he performed fair, but had to reduce weight to pain free level with knee extensions. Patient continues to show decreased LE strength, bilateral knee pain, decreased balance, and decreased activity tolerance making him a good candidate for continuing skilled PT at this time.   OBJECTIVE IMPAIRMENTS: Decreased knowledge of condition, decreased use of DME, decreased mobility, difficulty walking, decreased strength, decreased ROM. ACTIVITY  LIMITATIONS: Lifting, standing, walking, squatting, transfers, locomotion level PARTICIPATION LIMITATIONS: Cleaning, laundry, interpersonal relationships, driving, yardwork, community activity.  PERSONAL FACTORS: Age, behavior pattern, education, past/current experiences, transportation, profession  are also affecting patient's functional outcome.  REHAB POTENTIAL: Good CLINICAL DECISION MAKING: Medium  EVALUATION COMPLEXITY: Moderate    PLAN:  PT FREQUENCY: 1-2x/week  PT DURATION: 6 weeks  PLANNED INTERVENTIONS: 97110-Therapeutic exercises, 97530- Therapeutic activity, 97112- Neuromuscular re-education, 97535- Self Care, 02859- Manual therapy, [phone]- Gait training, [phone]- Electrical stimulation (unattended), Patient/Family education, Balance training, Stair training, Vestibular training, Visual/preceptual remediation/compensation, Cryotherapy, and Moist heat  PLAN FOR NEXT SESSION:  Follow-up on expanded HEP Sagittal plane hip strength B LE strengthening exercises to decrease knee pain with stair navigation Progress balance,   Norman Sharps, PT, DPT Physical Therapist - [facility]  [phone]     3:31 PM 01/15/2024  "

## 2024-01-17 ENCOUNTER — Ambulatory Visit: Payer: Self-pay | Admitting: Gastroenterology

## 2024-01-22 ENCOUNTER — Ambulatory Visit: Payer: MEDICAID

## 2024-01-24 ENCOUNTER — Encounter: Payer: Self-pay | Admitting: Cardiology

## 2024-01-24 ENCOUNTER — Ambulatory Visit: Payer: MEDICAID | Admitting: Cardiology

## 2024-01-24 VITALS — BP 132/74 | HR 74 | Ht 72.0 in | Wt 228.0 lb

## 2024-01-24 DIAGNOSIS — F1091 Alcohol use, unspecified, in remission: Secondary | ICD-10-CM

## 2024-01-24 DIAGNOSIS — I10 Essential (primary) hypertension: Secondary | ICD-10-CM

## 2024-01-24 DIAGNOSIS — F419 Anxiety disorder, unspecified: Secondary | ICD-10-CM

## 2024-01-24 DIAGNOSIS — E65 Localized adiposity: Secondary | ICD-10-CM | POA: Diagnosis not present

## 2024-01-24 NOTE — Progress Notes (Signed)
 "  Established Patient Office Visit  Subjective:  Patient ID: Lawrence Rowe, male    DOB: 05-18-1970  Age: 54 y.o. MRN: [id number]  Chief Complaint  Patient presents with   Follow-up    Follow Up    Patient in office for regular follow up. Patient doing well, no new complaints today. Continues to have bilateral knee pain, discussed losing weight to help with pain, taking Tylenol  as needed.  Has not had alcohol  since July 2025. Scheduled for colonoscopy 02/08/2024.  Blood pressure controlled.  Continue current medications.     No other concerns at this time.   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)    GERD (gastroesophageal reflux disease)    Hypertension     Past Surgical History:  Procedure Laterality Date   ELBOW SURGERY Left     Social History   Socioeconomic History   Marital status: Single    Spouse name: Not on file   Number of children: Not on file   Years of education: Not on file   Highest education level: Bachelor's degree (e.g., BA, AB, BS)  Occupational History   Not on file  Tobacco Use   Smoking status: Former    Current packs/day: 0.00    Types: Cigarettes    Quit date: 2020    Years since quitting: 6.0   Smokeless tobacco: Never  Vaping Use   Vaping status: Never Used  Substance and Sexual Activity   Alcohol  use: Not Currently    Alcohol /week: 12.0 standard drinks of alcohol     Types: 12 Cans of beer per week    Comment: daily 12 pk-- Patient has recently quit drinking; 07/11/2023.   Drug use: Not Currently    Types: Marijuana   Sexual activity: Not Currently  Other Topics Concern   Not on file  Social History Narrative   Not on file   Social Drivers of Health   Tobacco Use: Medium Risk (01/24/2024)   Patient History    Smoking Tobacco Use: Former    Smokeless Tobacco Use: Never    Passive Exposure: Not on Actuary  Strain: Not on file  Food Insecurity: No Food Insecurity (07/21/2023)   Epic    Worried About Programme Researcher, Broadcasting/film/video in the Last Year: Never true    Ran Out of Food in the Last Year: Never true  Transportation Needs: No Transportation Needs (07/21/2023)   Epic    Lack of Transportation (Medical): No    Lack of Transportation (Non-Medical): No  Physical Activity: Not on file  Stress: Not on file  Social Connections: Not on file  Intimate Partner Violence: Not At Risk (07/21/2023)   Epic    Fear of Current or Ex-Partner: No    Emotionally Abused: No    Physically Abused: No    Sexually Abused: No  Depression (PHQ2-9): Low Risk (10/24/2023)   Depression (PHQ2-9)    PHQ-2 Score: 3  Recent Concern: Depression (PHQ2-9) - High Risk (09/24/2023)   Depression (PHQ2-9)    PHQ-2 Score: 21  Alcohol  Screen: Not on file  Housing: Low Risk (07/21/2023)   Epic    Unable to Pay for Housing in the Last Year: No    Number of Times Moved in the Last Year: 0    Homeless in the Last Year: No  Utilities: At Risk (07/21/2023)   Epic    Threatened  with loss of utilities: Already shut off  Health Literacy: Not on file    Family History  Problem Relation Age of Onset   Diabetes Mother    Bladder Cancer Father    Lupus Sister    Alcohol  abuse Neg Hx    Mental illness Neg Hx     Allergies[1]  Show/hide medication list[2]  Review of Systems  Constitutional: Negative.   HENT: Negative.    Eyes: Negative.   Respiratory: Negative.  Negative for shortness of breath.   Cardiovascular:  Positive for leg swelling. Negative for chest pain.  Gastrointestinal: Negative.  Negative for abdominal pain, constipation and diarrhea.  Genitourinary: Negative.   Musculoskeletal:  Positive for joint pain. Negative for myalgias.  Skin: Negative.   Neurological: Negative.  Negative for dizziness and headaches.  Endo/Heme/Allergies: Negative.   All other systems reviewed and are negative.      Objective:   BP  132/74   Pulse 74   Ht 6' (1.829 m)   Wt 228 lb (103.4 kg)   SpO2 95%   BMI 30.92 kg/m   Vitals:   01/24/24 1312  BP: 132/74  Pulse: 74  Height: 6' (1.829 m)  Weight: 228 lb (103.4 kg)  SpO2: 95%  BMI (Calculated): 30.92    Physical Exam Nursing note reviewed.  Constitutional:      Appearance: Normal appearance. He is normal weight.  HENT:     Head: Normocephalic and atraumatic.     Nose: Nose normal.     Mouth/Throat:     Mouth: Mucous membranes are moist.     Pharynx: Oropharynx is clear.  Eyes:     Extraocular Movements: Extraocular movements intact.     Conjunctiva/sclera: Conjunctivae normal.     Pupils: Pupils are equal, round, and reactive to light.  Cardiovascular:     Rate and Rhythm: Normal rate and regular rhythm.     Pulses: Normal pulses.     Heart sounds: Normal heart sounds.  Pulmonary:     Effort: Pulmonary effort is normal.     Breath sounds: Normal breath sounds.  Abdominal:     General: Abdomen is flat. Bowel sounds are normal.     Palpations: Abdomen is soft.  Musculoskeletal:        General: Normal range of motion.     Cervical back: Normal range of motion.  Skin:    General: Skin is warm and dry.  Neurological:     General: No focal deficit present.     Mental Status: He is alert and oriented to person, place, and time.  Psychiatric:        Mood and Affect: Mood normal.        Behavior: Behavior normal.        Thought Content: Thought content normal.        Judgment: Judgment normal.      No results found for any visits on 01/24/24.  Recent Results (from the past 2160 hours)  AFP tumor marker     Status: None   Collection Time: 12/20/23  2:22 PM  Result Value Ref Range   AFP, Serum, Tumor Marker 3.7 0.0 - 8.4 ng/mL    Comment: Roche [employer] Electrochemiluminescence Immunoassay (ECLIA) Values obtained with different assay methods or kits cannot be used interchangeably.  Results cannot be interpreted as absolute evidence of  the presence or absence of malignant disease. This test is not interpretable in pregnant females.   INR/PT     Status: Abnormal  Collection Time: 12/20/23  2:22 PM  Result Value Ref Range   INR 1.1 0.9 - 1.2    Comment: Reference interval is for non-anticoagulated patients. Suggested INR therapeutic range for Vitamin K antagonist therapy:    Standard Dose (moderate intensity                   therapeutic range):       2.0 - 3.0    Higher intensity therapeutic range       2.5 - 3.5    Prothrombin Time 12.3 (H) 9.1 - 12.0 sec      Assessment & Plan:  Continue same medications  Problem List Items Addressed This Visit       Cardiovascular and Mediastinum   Primary hypertension - Primary     Other   Alcohol  use disorder in remission   Truncal obesity   Anxiety disorder   Relevant Medications   escitalopram  (LEXAPRO ) 10 MG tablet    Return in about 4 months (around 05/23/2024) for fasting lab work prior.   Total time spent: 25 minutes. This time includes review of previous notes and results and patient face to face interaction during today's visit.    Jeoffrey Pollen, NP  01/24/2024   This document may have been prepared by [facility] Voice Recognition software and as such may include unintentional dictation errors.     [1]  Allergies Allergen Reactions   Penicillins Other (See Comments)  [2]  Outpatient Medications Prior to Visit  Medication Sig   escitalopram  (LEXAPRO ) 10 MG tablet Take 10 mg by mouth daily.   folic acid  (FOLVITE ) 1 MG tablet Take 1 tablet (1 mg total) by mouth daily.   furosemide  (LASIX ) 20 MG tablet TAKE ONE TABLET DAILY AS NEEDED   hydrOXYzine  (ATARAX ) 10 MG tablet TAKE 1 TABLET BY MOUTH 3 TIMES DAILY AS NEEDED   mirtazapine  (REMERON ) 7.5 MG tablet TAKE 1 TABLET BY MOUTH AT BEDTIME.   Multiple Vitamin (MULTIVITAMIN WITH MINERALS) TABS tablet Take 1 tablet by mouth daily.   nadolol  (CORGARD ) 40 MG tablet Take 1 tablet (40 mg total) by mouth  daily.   Secukinumab  (COSENTYX  UNOREADY) 300 MG/2ML SOAJ Inject 300 mg into the skin once a week.   Secukinumab  (COSENTYX  UNOREADY) 300 MG/2ML SOAJ Inject 300 mg into the skin every 28 (twenty-eight) days.   thiamine  (VITAMIN B-1) 100 MG tablet Take 1 tablet (100 mg total) by mouth daily.   triamcinolone  cream (KENALOG ) 0.1 % Apply twice daily to affected areas until improved, for psoriasis. Avoid applying to face, groin, and axilla.   Facility-Administered Medications Prior to Visit  Medication Dose Route Frequency Provider   nadolol  (CORGARD ) tablet 40 mg  40 mg Oral Daily    "

## 2024-01-29 ENCOUNTER — Ambulatory Visit: Payer: MEDICAID

## 2024-01-31 ENCOUNTER — Ambulatory Visit: Payer: MEDICAID | Admitting: Dermatology

## 2024-01-31 ENCOUNTER — Encounter: Payer: Self-pay | Admitting: Dermatology

## 2024-01-31 DIAGNOSIS — Z79899 Other long term (current) drug therapy: Secondary | ICD-10-CM

## 2024-01-31 DIAGNOSIS — Z7189 Other specified counseling: Secondary | ICD-10-CM | POA: Diagnosis not present

## 2024-01-31 DIAGNOSIS — L409 Psoriasis, unspecified: Secondary | ICD-10-CM | POA: Diagnosis not present

## 2024-01-31 DIAGNOSIS — M25561 Pain in right knee: Secondary | ICD-10-CM

## 2024-01-31 NOTE — Patient Instructions (Signed)

## 2024-01-31 NOTE — Progress Notes (Signed)
" ° °  Follow-Up Visit   Subjective  Lawrence Rowe is a 54 y.o. male who presents for the following: Psoriasis  13m f/u, no current flares Cosentyx  injection 01/16/24, [facility] 0.1% cr prn, well controlled since switching medications.    Patient accompanied by POA, [name] who contributes to history.  The following portions of the chart were reviewed this encounter and updated as appropriate: medications, allergies, medical history  Review of Systems:  No other skin or systemic complaints except as noted in HPI or Assessment and Plan.  Objective  Well appearing patient in no apparent distress; mood and affect are within normal limits.  Areas Examined: Lower extremities   Relevant exam findings are noted in the Assessment and Plan.      Assessment & Plan   ARTHRALGIA OF BOTH KNEES   This Visit - Ambulatory referral to Rheumatology PSORIASIS   Existing Treatments - triamcinolone  cream (KENALOG ) 0.1 % - Apply twice daily to affected areas until improved, for psoriasis. Avoid applying to face, groin, and axilla. LONG-TERM USE OF HIGH-RISK MEDICATION   COUNSELING AND COORDINATION OF CARE   MEDICATION MANAGEMENT    PSORIASIS on systemic treatment  Patient recently switched to Cosentyx  from Skyrizi  due to insurance coverage. He is well controlled sice switching with no current flares.  Postinflammatory erythematous patches on lower legs. 0% BSA.   Patient c/o pain in bilateral knees - will refer to rheumatology for evaluation   Treatment Plan: - Continue secukinumab  300mg  monthly - Discussed side effects of medication at length including increased risk of infection, nasopharyngitis, rash, diarrhea, exacerbation of IBD, headache - confirmed no history of inflammatory bowel disease - Monitoring: Quantiferon wnl 08/07/23 will repeat annually  - educated on risk of injection site reactions, infections, IBD exacerbation Continue triamcinolone  ointment 0.1% twice daily to affected  areas of skin as needed for flares  Discussed side effect of potent topical steroids including atrophy, dyspigmentation, striae, telangectasia, folliculitis, loss of skin pigment, hair growth, tachyphylaxis, risk of systemic absorption with missuse.    Counseling on psoriasis and coordination of care  psoriasis is a chronic non-curable, but treatable genetic/hereditary disease that may have other systemic features affecting other organ systems such as joints (Psoriatic Arthritis). It is associated with an increased risk of inflammatory bowel disease, heart disease, non-alcoholic fatty liver disease, and depression.  Treatments include light and laser treatments; topical medications; and systemic medications including oral and injectables.    Return in about 1 year (around 01/30/2025) for Psoriasis.  I, Emerick Ege, CMA am acting as scribe for Boneta Sharps, MD  Documentation: I have reviewed the above documentation for accuracy and completeness, and I agree with the above.  Boneta Sharps, MD  "

## 2024-02-05 ENCOUNTER — Encounter: Payer: Self-pay | Admitting: Gastroenterology

## 2024-02-05 ENCOUNTER — Ambulatory Visit: Payer: MEDICAID

## 2024-02-07 NOTE — H&P (Signed)
 "  Lawrence Schaffer, MD [facility] [address], [address] [place], [place] [address] Phone:[phone] Fax : [phone]  Primary Care Physician:  Carin Gauze, NP Primary Gastroenterologist:  Dr. Schaffer  Pre-Procedure History & Physical: HPI:  AGRON SWINEY is a 54 y.o. male is here for an EGD for variceal screening in the setting of ETOH cirrhosis and celiac biopsy as eval of diarrhea and colonoscopy as screening for CRC with random biopsies to eval for Microscopic Colitis.    Fhx CRC? No Blood thinners? 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 Authorizing Provider  escitalopram  (LEXAPRO ) 10 MG tablet Take 10 mg by mouth daily. 01/09/24   [provider]  folic acid  (FOLVITE ) 1 MG tablet Take 1 tablet (1 mg total) by mouth daily. 09/24/23   Scoggins, Hospital Doctor, NP  furosemide  (LASIX ) 20 MG tablet TAKE ONE TABLET DAILY AS NEEDED 12/10/23   Gollan, Timothy J, MD  hydrOXYzine  (ATARAX ) 10 MG tablet TAKE 1 TABLET BY MOUTH 3 TIMES DAILY AS NEEDED 12/05/23   Scoggins, Hospital Doctor, NP  mirtazapine  (REMERON ) 7.5 MG tablet TAKE 1 TABLET BY MOUTH AT BEDTIME. 01/09/24   Eappen, Saramma, MD  Multiple Vitamin (MULTIVITAMIN WITH MINERALS) TABS tablet Take 1 tablet by mouth daily. 07/25/23   Barbarann Nest, MD  nadolol  (CORGARD ) 40 MG tablet Take 1 tablet (40 mg total) by mouth daily. 12/31/23   Rowe Lawrence HERO, MD  Secukinumab  (COSENTYX  UNOREADY) 300 MG/2ML SOAJ Inject 300 mg into the skin once a week. 12/12/23   Claudene Lehmann, MD  Secukinumab  (COSENTYX  UNOREADY) 300 MG/2ML SOAJ Inject 300 mg into the skin every 28  (twenty-eight) days. 01/16/24   Claudene Lehmann, MD  thiamine  (VITAMIN B-1) 100 MG tablet Take 1 tablet (100 mg total) by mouth daily. 09/24/23   Scoggins, Amber, NP  triamcinolone  cream (KENALOG ) 0.1 % Apply twice daily to affected areas until improved, for psoriasis. Avoid applying to face, groin, and axilla. 11/01/23   Claudene Lehmann, MD    Allergies as of 12/20/2023 - Review Complete 12/20/2023  Allergen Reaction Noted   Penicillins Other (See Comments) 10/29/2020    Family History  Problem Relation Age of Onset   Diabetes Mother    Bladder Cancer Father    Lupus Sister    Alcohol  abuse Neg Hx    Mental illness Neg Hx     Social History   Socioeconomic History   Marital status: Single    Spouse name: Not on file   Number of children: Not on file   Years of education: Not on file   Highest education level: Bachelor's degree (e.g., BA, AB, BS)  Occupational History   Not on file  Tobacco Use   Smoking status: Former    Current packs/day: 0.00    Types: Cigarettes    Quit date: 2020    Years since quitting: 6.0   Smokeless tobacco: Never  Vaping Use   Vaping status: Never Used  Substance and Sexual Activity   Alcohol  use: Not Currently    Alcohol /week: 12.0 standard drinks of alcohol   Types: 12 Cans of beer per week    Comment: daily 12 pk-- Patient has recently quit drinking; 07/11/2023.   Drug use: Not Currently    Types: Marijuana    Comment: in college and 30's   Sexual activity: Not Currently  Other Topics Concern   Not on file  Social History Narrative   Not on file   Social Drivers of Health   Tobacco Use: Medium Risk (02/05/2024)   Patient History    Smoking Tobacco Use: Former    Smokeless Tobacco Use: Never    Passive Exposure: Not on Actuary Strain: Not on file  Food Insecurity: No Food Insecurity (07/21/2023)   Epic    Worried About Programme Researcher, Broadcasting/film/video in the Last Year: Never true    Ran Out of Food in the Last Year:  Never true  Transportation Needs: No Transportation Needs (07/21/2023)   Epic    Lack of Transportation (Medical): No    Lack of Transportation (Non-Medical): No  Physical Activity: Not on file  Stress: Not on file  Social Connections: Not on file  Intimate Partner Violence: Not At Risk (07/21/2023)   Epic    Fear of Current or Ex-Partner: No    Emotionally Abused: No    Physically Abused: No    Sexually Abused: No  Depression (PHQ2-9): Low Risk (10/24/2023)   Depression (PHQ2-9)    PHQ-2 Score: 3  Recent Concern: Depression (PHQ2-9) - High Risk (09/24/2023)   Depression (PHQ2-9)    PHQ-2 Score: 21  Alcohol  Screen: Not on file  Housing: Low Risk (07/21/2023)   Epic    Unable to Pay for Housing in the Last Year: No    Number of Times Moved in the Last Year: 0    Homeless in the Last Year: No  Utilities: At Risk (07/21/2023)   Epic    Threatened with loss of utilities: Already shut off  Health Literacy: Not on file    Review of Systems: See HPI, otherwise negative ROS  Physical Exam: Ht 6' (1.829 m)   Wt 103.4 kg   BMI 30.92 kg/m  CONSTITUTIONAL: Well-appearing in no acute distress.  HEENT: Pupils equal, round, Extraocular movements intact. Conjunctivae clear NECK: Neck supple CARDIOVASCULAR: Regular rate, no LE edema  RESPIRATORY: No labored breathing  ABDOMEN: Abdomen soft, nontender, not distended, no guarding, no rigidity SKIN: No apparent skin rashes or lesions. NEUROLOGIC: Normal speech, no focal findings. Mental status alert and oriented x4. PSYCHIATRIC: Mood and affect normal.   Impression/Plan: Lawrence Rowe is here for an an EGD for variceal screening in the setting of ETOH cirrhosis and celiac biopsy as eval of diarrhea and colonoscopy as screening for CRC with random biopsies to eval for Microscopic Colitis.   Risks, benefits, limitations, and alternatives regarding  endoscopy and colonoscopy have been reviewed with the patient.  Questions have been  answered.  All parties agreeable.   MELANY Lawrence HERO, MD  02/07/2024, 3:50 PM  "

## 2024-02-08 ENCOUNTER — Ambulatory Visit
Admission: RE | Admit: 2024-02-08 | Discharge: 2024-02-08 | Disposition: A | Discharge status: Home / Self Care | Payer: MEDICAID | Attending: Gastroenterology | Admitting: Gastroenterology

## 2024-02-08 ENCOUNTER — Ambulatory Visit: Payer: Self-pay | Admitting: Anesthesiology

## 2024-02-08 ENCOUNTER — Encounter
Admission: RE | Disposition: A | Discharge status: Home / Self Care | Payer: Self-pay | Source: Home / Self Care | Attending: Gastroenterology

## 2024-02-08 ENCOUNTER — Other Ambulatory Visit: Payer: Self-pay

## 2024-02-08 ENCOUNTER — Encounter: Payer: Self-pay | Admitting: Gastroenterology

## 2024-02-08 DIAGNOSIS — Z87891 Personal history of nicotine dependence: Secondary | ICD-10-CM | POA: Insufficient documentation

## 2024-02-08 DIAGNOSIS — K7030 Alcoholic cirrhosis of liver without ascites: Secondary | ICD-10-CM | POA: Diagnosis not present

## 2024-02-08 DIAGNOSIS — R197 Diarrhea, unspecified: Secondary | ICD-10-CM

## 2024-02-08 DIAGNOSIS — K7031 Alcoholic cirrhosis of liver with ascites: Secondary | ICD-10-CM

## 2024-02-08 DIAGNOSIS — I739 Peripheral vascular disease, unspecified: Secondary | ICD-10-CM | POA: Diagnosis not present

## 2024-02-08 DIAGNOSIS — D120 Benign neoplasm of cecum: Secondary | ICD-10-CM | POA: Diagnosis not present

## 2024-02-08 DIAGNOSIS — I8500 Esophageal varices without bleeding: Secondary | ICD-10-CM | POA: Diagnosis not present

## 2024-02-08 DIAGNOSIS — I8510 Secondary esophageal varices without bleeding: Secondary | ICD-10-CM | POA: Diagnosis not present

## 2024-02-08 DIAGNOSIS — Z1211 Encounter for screening for malignant neoplasm of colon: Secondary | ICD-10-CM | POA: Diagnosis present

## 2024-02-08 DIAGNOSIS — K766 Portal hypertension: Secondary | ICD-10-CM | POA: Diagnosis not present

## 2024-02-08 DIAGNOSIS — Z13810 Encounter for screening for upper gastrointestinal disorder: Secondary | ICD-10-CM | POA: Insufficient documentation

## 2024-02-08 DIAGNOSIS — I10 Essential (primary) hypertension: Secondary | ICD-10-CM | POA: Diagnosis not present

## 2024-02-08 DIAGNOSIS — K922 Gastrointestinal hemorrhage, unspecified: Secondary | ICD-10-CM

## 2024-02-08 DIAGNOSIS — K3189 Other diseases of stomach and duodenum: Secondary | ICD-10-CM | POA: Diagnosis not present

## 2024-02-08 MED ORDER — LIDOCAINE HCL (CARDIAC) PF 100 MG/5ML IV SOSY
PREFILLED_SYRINGE | INTRAVENOUS | Status: DC | PRN
  Administered 2024-02-08: 100 mg via INTRAVENOUS

## 2024-02-08 MED ORDER — SODIUM CHLORIDE 0.9 % IV SOLN: INTRAVENOUS | Status: DC

## 2024-02-08 MED ORDER — PROPOFOL 10 MG/ML IV BOLUS
INTRAVENOUS | Status: DC | PRN
  Administered 2024-02-08: 100 mg via INTRAVENOUS
  Administered 2024-02-08 (×4): 50 mg via INTRAVENOUS

## 2024-02-08 MED ORDER — STERILE WATER FOR IRRIGATION IR SOLN
Status: DC | PRN
  Administered 2024-02-08 (×2): 250 mL

## 2024-02-08 MED ORDER — LACTATED RINGERS IV SOLN: INTRAVENOUS | Status: DC

## 2024-02-08 NOTE — Op Note (Signed)
 [facility] Gastroenterology Patient Name: Lawrence Rowe Procedure Date: 02/08/2024 10:24 AM MRN: [id number] Account #: [id number] Date of Birth: Jul 15, 1970 Admit Type: Outpatient Age: 54 Room: [facility] OR ROOM 01 Gender: Male Note Status: Finalized Instrument Name: Endoscope [id number] Procedure:             Upper GI endoscopy Indications:           Screening procedure, Endoscopy to assess diarrhea in                         patient suspected of having celiac disease Providers:             Clotilda Schaffer, MD Referring MD:          Jeoffrey Pollen (Referring MD) Medicines:             Propofol  per Anesthesia Complications:         No immediate complications. Procedure:             Pre-Anesthesia Assessment:                        - Prior to the procedure, a History and Physical was                         performed, and patient medications and allergies were                         reviewed. The patient's tolerance of previous                         anesthesia was also reviewed. The risks and benefits                         of the procedure and the sedation options and risks                         were discussed with the patient. All questions were                         answered, and informed consent was obtained. Prior                         Anticoagulants: The patient has taken no anticoagulant                         or antiplatelet agents. ASA Grade Assessment: III - A                         patient with severe systemic disease. After reviewing                         the risks and benefits, the patient was deemed in                         satisfactory condition to undergo the procedure.                        After obtaining informed consent, the endoscope was  passed under direct vision. Throughout the procedure,                         the patient's blood pressure, pulse, and oxygen                         saturations were  monitored continuously. The Endoscope                         was introduced through the mouth, and advanced to the                         third part of duodenum. The upper GI endoscopy was                         accomplished without difficulty. The patient tolerated                         the procedure well. Findings:      Grade I varices were found in the middle third of the esophagus and in       the lower third of the esophagus.      Moderate portal hypertensive gastropathy was found in the entire       examined stomach. Biopsies were taken with a cold forceps for histology.      The examined duodenum was normal. Biopsies for histology were taken with       a cold forceps for evaluation of celiac disease. Impression:            - Grade I esophageal varices.                        - Portal hypertensive gastropathy. Biopsied.                        - Normal examined duodenum. Biopsied. Recommendation:        - Patient has a contact number available for                         emergencies. The signs and symptoms of potential                         delayed complications were discussed with the patient.                         Return to normal activities tomorrow. Written                         discharge instructions were provided to the patient.                        - High fiber diet.                        - Continue present medications.                        - Await pathology results.                        -  Repeat upper endoscopy in 3 years.                        - The findings and recommendations were discussed with                         the designated responsible adult. Procedure Code(s):     --- Professional ---                        [phone], Esophagogastroduodenoscopy, flexible,                         transoral; with biopsy, single or multiple Diagnosis Code(s):     --- Professional ---                        I85.00, Esophageal varices without bleeding                         K76.6, Portal hypertension                        K31.89, Other diseases of stomach and duodenum                        Z13.810, Encounter for screening for upper                         gastrointestinal disorder                        R19.7, Diarrhea, unspecified CPT copyright 2022 American Medical Association. All rights reserved. The codes documented in this report are preliminary and upon coder review may  be revised to meet current compliance requirements. Clotilda Schaffer, MD 02/08/2024 10:43:28 AM Number of Addenda: 0 Note Initiated On: 02/08/2024 10:24 AM Total Procedure Duration: 0 hours 3 minutes 40 seconds  Estimated Blood Loss:  Estimated blood loss: none.      [facility]

## 2024-02-08 NOTE — Anesthesia Preprocedure Evaluation (Addendum)
"                                    Anesthesia Evaluation  Patient identified by MRN, date of birth, ID band Patient awake    Reviewed: Allergy & Precautions, H&P , NPO status , Patient's Chart, lab work & pertinent test results  Airway Mallampati: III  TM Distance: >3 FB Neck ROM: full    Dental  (+) Poor Dentition   Pulmonary former smoker   Pulmonary exam normal        Cardiovascular hypertension, + Peripheral Vascular Disease  Normal cardiovascular exam     Neuro/Psych  PSYCHIATRIC DISORDERS         GI/Hepatic negative GI ROS,,,(+) Cirrhosis         Endo/Other  negative endocrine ROS    Renal/GU negative Renal ROS  negative genitourinary   Musculoskeletal   Abdominal  (+) + obese  Peds  Hematology negative hematology ROS (+)   Anesthesia Other Findings Past Medical History: Wernicke encephalopathy syndrome No date: Alcohol  abuse in remission No date: Alcohol -induced anxiety disorder with moderate or severe use  disorder (HCC) No date: GERD (gastroesophageal reflux disease) No date: Hypertension  Past Surgical History: No date: CYSTOSCOPY; N/A No date: ELBOW SURGERY; Left No date: WISDOM TOOTH EXTRACTION; N/A  BMI    Body Mass Index: 30.92 kg/m      Reproductive/Obstetrics negative OB ROS                              Anesthesia Physical Anesthesia Plan  ASA: 3  Anesthesia Plan: General   Post-op Pain Management:    Induction: Intravenous  PONV Risk Score and Plan: Propofol  infusion and TIVA  Airway Management Planned: Natural Airway  Additional Equipment:   Intra-op Plan:   Post-operative Plan:   Informed Consent: I have reviewed the patients History and Physical, chart, labs and discussed the procedure including the risks, benefits and alternatives for the proposed anesthesia with the patient or authorized representative who has indicated his/her understanding and acceptance.     Dental  Advisory Given  Plan Discussed with: CRNA and Surgeon  Anesthesia Plan Comments:          Anesthesia Quick Evaluation  "

## 2024-02-08 NOTE — Op Note (Signed)
 [facility] Gastroenterology Patient Name: Lawrence Rowe Procedure Date: 02/08/2024 10:23 AM MRN: [id number] Account #: [id number] Date of Birth: 07-May-1970 Admit Type: Outpatient Age: 54 Room: [facility] OR ROOM 01 Gender: Male Note Status: Finalized Instrument Name: Colonoscope [id number] Procedure:             Colonoscopy Indications:           Screening for colorectal malignant neoplasm Providers:             Clotilda Schaffer, MD Referring MD:          Jeoffrey Pollen (Referring MD) Medicines:             Propofol  per Anesthesia Complications:         No immediate complications. Procedure:             Pre-Anesthesia Assessment:                        - Prior to the procedure, a History and Physical was                         performed, and patient medications and allergies were                         reviewed. The patient's tolerance of previous                         anesthesia was also reviewed. The risks and benefits                         of the procedure and the sedation options and risks                         were discussed with the patient. All questions were                         answered, and informed consent was obtained. Prior                         Anticoagulants: The patient has taken no anticoagulant                         or antiplatelet agents. ASA Grade Assessment: III - A                         patient with severe systemic disease. After reviewing                         the risks and benefits, the patient was deemed in                         satisfactory condition to undergo the procedure.                        After obtaining informed consent, the colonoscope was                         passed under direct vision. Throughout the procedure,  the patient's blood pressure, pulse, and oxygen                         saturations were monitored continuously. The                         Colonoscope was introduced  through the anus and                         advanced to the the cecum, identified by appendiceal                         orifice and ileocecal valve. The ileocecal valve,                         appendiceal orifice, and rectum were photographed. Findings:      A large amount of stool was found in the entire colon, clogging the       scope and precluding visualization.      Two sessile polyps were found in the cecum. The polyps were 10 to 15 mm       in size. These polyps were removed with a cold snare. Polyp resection       was complete, and the resected tissue was not retrieved because the       scope could not be unclogged. Estimated blood loss: none. Impression:            - Stool in the entire examined colon.                        - Two 10 to 15 mm polyps in the cecum, removed with a                         cold snare. Incomplete resection. Resected tissue not                         retrieved. Recommendation:        - Patient has a contact number available for                         emergencies. The signs and symptoms of potential                         delayed complications were discussed with the patient.                         Return to normal activities tomorrow. Written                         discharge instructions were provided to the patient.                        - High fiber diet.                        - Continue present medications.                        - Repeat colonoscopy in 6 months because the bowel  preparation was suboptimal.                        - The findings and recommendations were discussed with                         the designated responsible adult. Procedure Code(s):     --- Professional ---                        [phone], Colonoscopy, flexible; with removal of                         tumor(s), polyp(s), or other lesion(s) by snare                         technique Diagnosis Code(s):     --- Professional ---                         Z12.11, Encounter for screening for malignant neoplasm                         of colon                        D12.0, Benign neoplasm of cecum CPT copyright 2022 American Medical Association. All rights reserved. The codes documented in this report are preliminary and upon coder review may  be revised to meet current compliance requirements. Clotilda Schaffer, MD 02/08/2024 11:11:07 AM Number of Addenda: 0 Note Initiated On: 02/08/2024 10:23 AM Scope Withdrawal Time: 0 hours 9 minutes 43 seconds  Total Procedure Duration: 0 hours 14 minutes 36 seconds  Estimated Blood Loss:  Estimated blood loss: none.      [facility]

## 2024-02-08 NOTE — Transfer of Care (Signed)
 Immediate Anesthesia Transfer of Care Note  Patient: Lawrence Rowe  Procedure(s) Performed: COLONOSCOPY (Rectum) EGD (ESOPHAGOGASTRODUODENOSCOPY) (Mouth) POLYPECTOMY, INTESTINE (Rectum)  Patient Location: PACU  Anesthesia Type: General  Level of Consciousness: awake, alert  and patient cooperative  Airway and Oxygen Therapy: Patient Spontanous Breathing and Patient connected to supplemental oxygen  Post-op Assessment: Post-op Vital signs reviewed, Patient's Cardiovascular Status Stable, Respiratory Function Stable, Patent Airway and No signs of Nausea or vomiting  Post-op Vital Signs: Reviewed and stable  Complications: No notable events documented.

## 2024-02-08 NOTE — Anesthesia Postprocedure Evaluation (Signed)
"   Anesthesia Post Note  Patient: Lawrence Rowe  Procedure(s) Performed: COLONOSCOPY (Rectum) EGD (ESOPHAGOGASTRODUODENOSCOPY) (Mouth) POLYPECTOMY, INTESTINE (Rectum)  Patient location during evaluation: PACU Anesthesia Type: General Level of consciousness: awake and alert Pain management: pain level controlled Vital Signs Assessment: post-procedure vital signs reviewed and stable Respiratory status: spontaneous breathing, nonlabored ventilation and respiratory function stable Cardiovascular status: blood pressure returned to baseline and stable Postop Assessment: no apparent nausea or vomiting Anesthetic complications: no   No notable events documented.   Last Vitals:  Vitals:   02/08/24 1113 02/08/24 1115  BP: 105/76 114/81  Pulse: 82   Resp: 20 20  Temp: 37.1 C   SpO2: 92%     Last Pain:  Vitals:   02/08/24 1113  TempSrc:   PainSc: 0-No pain                 Camellia Merilee Louder      "

## 2024-02-11 LAB — SURGICAL PATHOLOGY

## 2024-02-11 NOTE — Addendum Note (Signed)
 Addendum  created 02/11/24 1652 by Vicci Camellia Glatter, MD   Attestation recorded in [place], Intraprocedure Attestations filed

## 2024-02-12 ENCOUNTER — Ambulatory Visit: Payer: MEDICAID

## 2024-02-12 ENCOUNTER — Ambulatory Visit: Payer: Self-pay | Admitting: Gastroenterology

## 2024-02-14 ENCOUNTER — Ambulatory Visit: Payer: MEDICAID | Admitting: Psychiatry

## 2024-02-19 ENCOUNTER — Ambulatory Visit: Payer: MEDICAID

## 2024-02-26 ENCOUNTER — Ambulatory Visit: Payer: MEDICAID

## 2024-03-20 ENCOUNTER — Ambulatory Visit: Payer: MEDICAID | Admitting: Gastroenterology

## 2024-05-05 ENCOUNTER — Ambulatory Visit: Payer: MEDICAID

## 2025-02-02 ENCOUNTER — Ambulatory Visit: Payer: MEDICAID | Admitting: Dermatology
